# Patient Record
Sex: Male | Born: 1990 | Race: White | Hispanic: No | Marital: Single | Smoking: Never smoker
Health system: Southern US, Community
[De-identification: ages and names within clinical notes are randomized; demographics above are authoritative.]

## PROBLEM LIST (undated history)

## (undated) DIAGNOSIS — B259 Cytomegaloviral disease, unspecified: Secondary | ICD-10-CM

## (undated) DIAGNOSIS — Z934 Other artificial openings of gastrointestinal tract status: Secondary | ICD-10-CM

## (undated) DIAGNOSIS — J9620 Acute and chronic respiratory failure, unspecified whether with hypoxia or hypercapnia: Secondary | ICD-10-CM

## (undated) DIAGNOSIS — G709 Myoneural disorder, unspecified: Secondary | ICD-10-CM

## (undated) DIAGNOSIS — Z8719 Personal history of other diseases of the digestive system: Secondary | ICD-10-CM

## (undated) DIAGNOSIS — R569 Unspecified convulsions: Secondary | ICD-10-CM

## (undated) DIAGNOSIS — T7840XA Allergy, unspecified, initial encounter: Secondary | ICD-10-CM

## (undated) DIAGNOSIS — G808 Other cerebral palsy: Secondary | ICD-10-CM

## (undated) DIAGNOSIS — J189 Pneumonia, unspecified organism: Secondary | ICD-10-CM

## (undated) DIAGNOSIS — K219 Gastro-esophageal reflux disease without esophagitis: Secondary | ICD-10-CM

## (undated) DIAGNOSIS — Z9981 Dependence on supplemental oxygen: Secondary | ICD-10-CM

## (undated) HISTORY — PX: NISSEN FUNDOPLICATION: SHX2091

## (undated) HISTORY — DX: Allergy, unspecified, initial encounter: T78.40XA

## (undated) HISTORY — DX: Pneumonia, unspecified organism: J18.9

## (undated) HISTORY — PX: PEG TUBE PLACEMENT: SUR1034

## (undated) HISTORY — DX: Acute and chronic respiratory failure, unspecified whether with hypoxia or hypercapnia: J96.20

## (undated) HISTORY — PX: CIRCUMCISION: SUR203

## (undated) HISTORY — DX: Other cerebral palsy: G80.8

## (undated) HISTORY — DX: Unspecified convulsions: R56.9

## (undated) HISTORY — PX: OTHER SURGICAL HISTORY: SHX169

---
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
## 2009-01-12 ENCOUNTER — Ambulatory Visit (HOSPITAL_COMMUNITY): Admission: RE | Admit: 2009-01-12 | Discharge: 2009-01-12 | Payer: Self-pay | Admitting: Interventional Radiology

## 2009-05-30 ENCOUNTER — Encounter: Payer: Self-pay | Admitting: Family Medicine

## 2009-05-30 DIAGNOSIS — H47619 Cortical blindness, unspecified side of brain: Secondary | ICD-10-CM

## 2009-05-30 DIAGNOSIS — G809 Cerebral palsy, unspecified: Secondary | ICD-10-CM

## 2009-05-30 DIAGNOSIS — H9190 Unspecified hearing loss, unspecified ear: Secondary | ICD-10-CM | POA: Insufficient documentation

## 2009-05-30 DIAGNOSIS — R569 Unspecified convulsions: Secondary | ICD-10-CM

## 2009-05-30 DIAGNOSIS — F79 Unspecified intellectual disabilities: Secondary | ICD-10-CM | POA: Insufficient documentation

## 2009-06-13 ENCOUNTER — Ambulatory Visit: Payer: Self-pay | Admitting: Family Medicine

## 2009-06-13 DIAGNOSIS — Z993 Dependence on wheelchair: Secondary | ICD-10-CM

## 2009-06-13 DIAGNOSIS — J45909 Unspecified asthma, uncomplicated: Secondary | ICD-10-CM | POA: Insufficient documentation

## 2009-06-13 DIAGNOSIS — G4733 Obstructive sleep apnea (adult) (pediatric): Secondary | ICD-10-CM

## 2009-06-14 ENCOUNTER — Encounter: Payer: Self-pay | Admitting: Family Medicine

## 2009-06-21 ENCOUNTER — Telehealth: Payer: Self-pay | Admitting: Family Medicine

## 2009-06-22 ENCOUNTER — Telehealth: Payer: Self-pay | Admitting: Family Medicine

## 2009-06-29 ENCOUNTER — Encounter: Payer: Self-pay | Admitting: Family Medicine

## 2009-07-20 ENCOUNTER — Ambulatory Visit (HOSPITAL_COMMUNITY)
Admission: RE | Admit: 2009-07-20 | Discharge: 2009-07-20 | Payer: Self-pay | Source: Home / Self Care | Admitting: Family Medicine

## 2009-08-21 ENCOUNTER — Encounter: Payer: Self-pay | Admitting: Family Medicine

## 2009-09-17 ENCOUNTER — Telehealth: Payer: Self-pay | Admitting: Family Medicine

## 2009-09-17 ENCOUNTER — Encounter: Payer: Self-pay | Admitting: Family Medicine

## 2009-10-03 ENCOUNTER — Ambulatory Visit (HOSPITAL_COMMUNITY): Admission: RE | Admit: 2009-10-03 | Discharge: 2009-10-03 | Payer: Self-pay | Admitting: Interventional Radiology

## 2009-10-23 ENCOUNTER — Encounter: Payer: Self-pay | Admitting: Family Medicine

## 2009-11-22 ENCOUNTER — Encounter: Payer: Self-pay | Admitting: Family Medicine

## 2009-11-28 ENCOUNTER — Ambulatory Visit: Payer: Self-pay | Admitting: Family Medicine

## 2009-11-30 ENCOUNTER — Encounter: Payer: Self-pay | Admitting: Family Medicine

## 2010-02-05 ENCOUNTER — Ambulatory Visit: Payer: Self-pay | Admitting: Family Medicine

## 2010-02-05 DIAGNOSIS — R0602 Shortness of breath: Secondary | ICD-10-CM

## 2010-03-08 ENCOUNTER — Encounter: Payer: Self-pay | Admitting: Family Medicine

## 2010-03-17 ENCOUNTER — Encounter: Payer: Self-pay | Admitting: Interventional Radiology

## 2010-03-21 ENCOUNTER — Ambulatory Visit (HOSPITAL_COMMUNITY)
Admission: RE | Admit: 2010-03-21 | Discharge: 2010-03-21 | Payer: Self-pay | Source: Home / Self Care | Attending: Interventional Radiology | Admitting: Interventional Radiology

## 2010-03-28 NOTE — Consult Note (Signed)
Summary: [facility]  [facility]   Imported By: Clydell Hakim 08/30/2009 15:31:16  _____________________________________________________________________  External Attachment:    Type:   Image     Comment:   External Document

## 2010-03-28 NOTE — Progress Notes (Signed)
Summary: Rx  Phone Note Refill Request   Refills Requested: Medication #1:  GLYCOPYRROLATE 1 MG TABS 1 tab per tube three times a day for lung secretions mom is asking for a new rx to be sent to the pharmacy from dr. Keydi Giel, pt is about to start school & mom sts the rx has to be from the doctor he is assigned to. last rx has dr Constance Goltz on it.   Initial call taken by: Knox Royalty,  September 17, 2009 11:15 AM  Follow-up for Phone Call        script sent Follow-up by: Ellery Plunk MD,  September 17, 2009 11:35 AM    Prescriptions: GLYCOPYRROLATE 1 MG TABS (GLYCOPYRROLATE) 1 tab per tube three times a day for lung secretions  #90 x 11   Entered and Authorized by:   Ellery Plunk MD   Signed by:   Ellery Plunk MD on 09/17/2009   Method used:   [id number]       [address]       [place], [place]  [address]       Ph: [phone] or [phone]       Fax: [phone]   RxID:   [id number]

## 2010-03-28 NOTE — Miscellaneous (Signed)
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 1 Tab Per Tube Two Times A Day 2)  Gabapentin 300 Mg Caps (Gabapentin) .Marland Kitchen.. 1 Tab Per Tube Three Times A Day 3)  Depakote Sprinkles 125 Mg Cpsp (Divalproex Sodium) .... 3 Caps Per Tube At 6 Am, 2 Caps Per Tube At 2 Pm, 3 Caps Per Tube At 9 Pm 4)  Baclofen 10 Mg Tabs (Baclofen) .... 2 Tabs Per Tube Three Times A Day 5)  Albuterol Sulfate (2.5 Mg/38ml) 0.083% Nebu (Albuterol Sulfate) .... Inhaled Every 4 Hours As Needed 6)  Pulmicort 0.25 Mg/7ml Susp (Budesonide) .... Inhaled Two Times A Day As Needed   Past History:  Past Surgical History: Jejunal Feeding Tube Spinal Rod Hip Surgery   Family History: Mother alive and healthy. Father alive:  DM. FH negative for cancer, early heart disease.  Social History: Lives in [place] with mom Coty Larsh, b 9[birth year]), dad Michaelpaul Apo, [place] 5[address]), brother Daiel Strohecker, [place] 8119), brother Luigi Stuckey, [place] 1478), sister Jaquari Reckner, [place] 2956).  In school at [employer].  All feeding is per tube (NPO).

## 2010-03-28 NOTE — Miscellaneous (Signed)
Summary: school forms  Clinical Lists Changes fprms to pcp chart box for signature.Golden Circle RN  October 23, 2009 5:04 PM   back to [place] on Friday. Ellery Plunk MD  October 30, 2009 9:27 AM

## 2010-03-28 NOTE — Consult Note (Signed)
Summary: [facility]-no change to meds  [facility]   Imported By: De Nurse 11/29/2009 15:08:53  _____________________________________________________________________  External Attachment:    Type:   Image     Comment:   External Document

## 2010-03-28 NOTE — Miscellaneous (Signed)
Summary: Medication authorization  pts mom dropped off form to be completed, placed on triage desk for any clinical info to be completed. [name]  September 17, 2009 2:57 PM    form for school to be able to administer meds at school to pcp.Golden Circle RN  September 17, 2009 3:36 PM  pcp finished form. I called dad & told him we are mailing it to his home as directed.Golden Circle RN  September 18, 2009 10:19 AM

## 2010-03-28 NOTE — Miscellaneous (Signed)
  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 

## 2010-03-28 NOTE — Assessment & Plan Note (Signed)
Summary: problems with asthma/eo   Vital Signs:  Patient profile:   20 year old male O2 Sat:      97 %  Vitals Entered By: Jimmy Footman, CMA (February 05, 2010 10:49 AM) CC: breathing issues   Primary Provider:  Ellery Plunk MD  CC:  breathing issues.  History of Present Illness: Pt's mom brings him in today after having an episode at school this morning where he was not breathing well.  He was taken to the nurse's office and given albuterol MDI x1 approximately 1hr before coming to clinic.  His O2 sat immediately after albuterol was in the 70's.  Mom states that when she picked him up he looks pale and had a noisy cough.  Mom states that over the past few days she has maybe noticed him "drawing in" with his chest when breathing before putting him on the bus, but did not have any problems at school or in the afternoon or evening when he got home.  No runny nose or cough, no fevers, has been looking well until this morning at school.  Of not, he has had multiple hospitalizations for aspiration, per mom, and has had mucus plugging in the past.  He has not needed him home PRN albuterol in the past few weeks and has been doing very well.  Mom thinks that he looks much better since she has picked him up from school.  She also feels like pulmicort often helps this kind of situation and helps prevent hospitalization and would really like a refill on it since she has not had it for many months.  Current Medications (verified): 1)  Prevacid 30 Mg Cpdr (Lansoprazole) .Marland Kitchen.. 1 Tab Per Tube Two Times A Day 2)  Gabapentin 300 Mg Caps (Gabapentin) .Marland Kitchen.. 1 Tab Per Tube Three Times A Day 3)  Depakote Sprinkles 125 Mg Cpsp (Divalproex Sodium) .... 3 Caps Per Tube At 6 Am, 2 Caps Per Tube At 2 Pm, 3 Caps Per Tube At 9 Pm 4)  Baclofen 10 Mg Tabs (Baclofen) .... 2 Tabs Per Tube Three Times A Day 5)  Albuterol Sulfate (2.5 Mg/26ml) 0.083% Nebu (Albuterol Sulfate) .... Inhaled Every 4 Hours As Needed 6)   Glycopyrrolate 1 Mg Tabs (Glycopyrrolate) .Marland Kitchen.. 1 Tab Per Tube Three Times A Day For Lung Secretions 7)  Diastat .... Unknown Dose 8)  Proventil Hfa 108 (90 Base) Mcg/act Aers (Albuterol Sulfate) .... 2 Puffs Q4hours As Needed For Wheeze  Allergies (verified): 1)  ! Septra  Physical Exam  General:  sleeping comfortably, good color, well hydrated Nose:  no nasal discharge.   Mouth:  pharynx pink and moist and fair dentition.   Lungs:  normal respiratory effort, no intercostal retractions, no accessory muscle use, normal breath sounds, and no crackles.  ?faint, mild, scattered wheezes Heart:  normal rate and regular rhythm.     Impression & Recommendations:  Problem # 1:  DYSPNEA (ICD-786.05)  Episode at school possibly 2/2 mucus plugging which pt was able to relieve with coughing.  O2 sat 97% here. No acute distress. Breathing comfortably. No accessory muscle use. No fevers or focal lung exam findings worrisome for infection.  Will restart pulmicort neb two times a day. Unsure of why peds pulm may have told mom not to use it long term.  Encouraged mom to go see adult pulm at [facility] so that his breathing can continue to be followed.    Orders: [facility]- Est Level  3 ([address])  Complete Medication List:  1)  Prevacid 30 Mg Cpdr (Lansoprazole) .Marland Kitchen.. 1 tab per tube two times a day 2)  Gabapentin 300 Mg Caps (Gabapentin) .Marland Kitchen.. 1 tab per tube three times a day 3)  Depakote Sprinkles 125 Mg Cpsp (Divalproex sodium) .... 3 caps per tube at 6 am, 2 caps per tube at 2 pm, 3 caps per tube at 9 pm 4)  Baclofen 10 Mg Tabs (Baclofen) .... 2 tabs per tube three times a day 5)  Albuterol Sulfate (2.5 Mg/31ml) 0.083% Nebu (Albuterol sulfate) .... Inhaled every 4 hours as needed 6)  Pulmicort 0.25 Mg/55ml Susp (Budesonide) .... Inhaled two times a day as needed 7)  Glycopyrrolate 1 Mg Tabs (Glycopyrrolate) .Marland Kitchen.. 1 tab per tube three times a day for lung secretions 8)  Diastat  .... Unknown dose 9)  Proventil Hfa  108 (90 Base) Mcg/act Aers (Albuterol sulfate) .... 2 puffs q4hours as needed for wheeze  Patient Instructions: 1)  [facility] [address] 2)  Please come back to clinic if he has another episode like this one, or if he begins having fevers, increased difficulty breathing, using his stomach or neck muscles to breath, or just isn't looking good.  At that time we would consider starting antibiotics.  However, he looks good right now, so I do not think that we need to start them just yet. 3)  Have a great holiday! Prescriptions: PULMICORT 0.25 MG/2ML SUSP (BUDESONIDE) Inhaled two times a day as needed  #1 QS x1 mo x 2   Entered and Authorized by:   Demetria Pore MD   Signed by:   Demetria Pore MD on 02/05/2010   Method used:   [facility]        [id number]       [address]       [place], [place]  [address]       Ph: [phone] or [phone]       Fax: [phone]   RxID:   [id number]    Orders Added: 1)  [facility]- Est Level  3 [[id number]]

## 2010-03-28 NOTE — Miscellaneous (Signed)
Summary: Summary of PMH from Dr. Donnie Coffin  Received handwritten summary of [name]'s PMH from Dr. Donnie Coffin, his former pediatrician.  Copied into chart as below:  Dx:  Congenital CMV Infection  1.  CNS     - Seizure disorder - [facility] follows       Last visit 10/25/2008 - Neurontin 300 three times a day       Depakote 125 3-2-3     - Static encephalopathy     - Spastic quadraparesis     - Microcephaly     - Profound mental retardation  2.  Ophtho     - Cortical blindness  3.  Pulmonary     - Sees pediatric pulmonology at [facility]:  Larry Sierras, et. al.     - Recurrent pulmonary aspiration       Glycopyrrolate 1 mg three times a day, suction, sleeps inclined     - OSA, CPAP poorly tolerated     - RAD, occasional albuterol       Baclofen 10 mg 3/4 three times a day     - Occasional pneumonia, last admission 2006  4.  GI - [facility]     - J-tube dependent since early on       Occasionally needs replacement at [facility] radiology     - GERD -- Nissen, Prevacid     - Slow transit constipation - Osmolite, Polyethylene Glycol     - Pancreatitis secondary to balloon 07/2000  5.  Skin - 03/2006, Stage 1 sacral decub     - Uses custom wheelchair, frequent repositioning  6.  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     - Small wedge anterior fracture L2     - Sacral fractures     - Pubic symphesis fracture  7.  Dental - [facility].  Some work done under anesthesia.  8.  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  Family -- nice, intelligent, resourceful, skilled in deailng with it all     - Mother:  Teacher, adult education at [employer]     - Father:  Leander Rams, DM     - Oldest Son:  Regulatory affairs officer, recent  grad     - Celiac disease and asthma in siblings     - Mother knows everything

## 2010-03-28 NOTE — Assessment & Plan Note (Signed)
Summary: NP,tcb   Vital Signs:  Patient profile:   20 year old male Temp:     97.9 degrees F axillary Pulse rate:   94 / minute BP sitting:   104 / 73  Vitals Entered By: Jone Baseman CMA (June 13, 2009 10:01 AM) CC: new patient Is Patient Diabetic? No Pain Assessment Patient in pain? no        Primary Care Provider:  Romero Belling MD  CC:  new patient.  History of Present Illness: 20 yo male with complex medical history presenting with mother to establish care.  All active problems are stable at this point.  Formerly seen by Dr. Maryellen Pile, pediatrics, but has graduated from pediatrics.  REACTIVE AIRWAY DISEASE (ICD-493.90):  History of frequent PNA requiring ICU admissions--no admissions for the past 2 years.  PMH also significant for pneumothorax.  Currently stable.  Uses the following:  - ALBUTEROL  - PULMICORT  - GLYCOPYRROLATE  SLEEP APNEA, OBSTRUCTIVE (ICD-327.23):  Has been prescribed CPAP but unable to use.  CONGENITAL CYTOMEGALOVIRUS INFECTION (ICD-771.1):  With multiple sequelae as below.  Lives at home, cared for by family, [facility]  All nutrition via J-tube.  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 1 Tab Per Tube Two Times A Day 2)  Gabapentin 300 Mg Caps  (Gabapentin) .Marland Kitchen.. 1 Tab Per Tube Three Times A Day 3)  Depakote Sprinkles 125 Mg Cpsp (Divalproex Sodium) .... 3 Caps Per Tube At 6 Am, 2 Caps Per Tube At 2 Pm, 3 Caps Per Tube At 9 Pm 4)  Baclofen 10 Mg Tabs (Baclofen) .... 2 Tabs Per Tube Three Times A Day 5)  Albuterol Sulfate (2.5 Mg/24ml) 0.083% Nebu (Albuterol Sulfate) .... Inhaled Every 4 Hours As Needed 6)  Pulmicort 0.25 Mg/90ml Susp (Budesonide) .... Inhaled Two Times A Day As Needed 7)  Glycopyrrolate 1 Mg Tabs (Glycopyrrolate) .Marland Kitchen.. 1 Tab Per Tube Three Times A Day For Lung Secretions 8)  Diastat .... Unknown Dose  Allergies (verified): 1)  ! Septra  Past History:  Past Medical History: Frequent PNA requiring ICU stays Pneumothorax Pulmonologist, Gastroenterologist: [facility], considering switch to [facility] for both now that is adult Neurologist -- [facility] -- Dr. Jearld Fenton, [facility]:  takes care of wheelchair contractures, spinal rod, annual visit  Past Surgical History: Chronic Jejunal Feeding Tube:  changed q1-25months as needed. Spinal Rod Hip Surgery  Family History: Mother alive and healthy. Father alive:  DM. FH negative for cancer, early heart disease.  Social History: Lives in [place] with mom Macaulay Reicher, b 60[birth year]), dad Odai Wimmer, [place] [address]), brother Tetsuo Coppola, [place] 1191), brother Leelynd Maldonado, [place] 478[address]), sister Laquincy Eastridge, [place] 9562).  In school at [employer].  All feeding is per J-tube (NPO).Smoking Status:  never  Review of Systems       Unable to obtain.  Physical Exam  Additional Exam:  VITALS:  Reviewed, normal GEN: Clean, well-dressed, well-groomed, in wheelchair, turns to loud voice NECK: Midline trachea, no masses/thyromegaly, no cervical lymphadenopathy CARDIO: Regular rate and rhythm, no murmurs/rubs/gallops, 2+ bilateral radial pulses RESP: Clear to auscultation, normal work of breathing, no retractions/accessory muscle use ABD: Normoactive bowel sounds,  nontender, no masses/hepatosplenomegaly, J-tube site c/d/i EXT: Nontender, no edema SKIN: Sacral skin intact MSK:  Contractures of arms and legs   Impression & Recommendations:  Problem # 1:  REACTIVE AIRWAY DISEASE (ICD-493.90) Assessment Unchanged Evaluated as stable.  Discussed establishing care with [facility] now that he is an adult. His updated medication list for this problem includes:    Albuterol Sulfate (2.5 Mg/102ml) 0.083% Nebu (Albuterol sulfate) ..... Inhaled every 4 hours as needed    Pulmicort 0.25 Mg/62ml Susp (Budesonide) ..... Inhaled two times a day as needed  Problem # 2:  SLEEP APNEA, OBSTRUCTIVE (ICD-327.23) Assessment: Unchanged Evaluated as stable.  Unable to use CPAP.  Discussed establishing care with [facility] now that he is an adult.  Problem # 3:  SEIZURE DISORDER (ICD-780.39) Evaluated as stable.  Frequent seizures daily.  Follows with Dr. Sharene Skeans. His updated medication list for this problem includes:    Gabapentin 300 Mg Caps (Gabapentin) .Marland Kitchen... 1 tab per tube three times a day    Depakote Sprinkles 125 Mg Cpsp (Divalproex sodium) .Marland KitchenMarland KitchenMarland KitchenMarland Kitchen 3 caps per tube at 6 am, 2 caps per tube at 2 pm, 3 caps per tube at 9 pm  Problem # 4:  Preventive Health Care (ICD-V70.0) Assessment: Comment Only Am verifying immunization status.  He would need all usual childhood immunizations plus Pneumovax.  Complete Medication List: 1)  Prevacid 30 Mg Cpdr (Lansoprazole) .Marland Kitchen.. 1 tab per tube two times a day 2)  Gabapentin 300 Mg Caps (Gabapentin) .Marland Kitchen.. 1 tab per tube three times a day 3)  Depakote Sprinkles 125 Mg Cpsp (Divalproex sodium) .... 3 caps per tube at 6 am, 2 caps per tube at 2 pm, 3 caps per tube at 9 pm 4)  Baclofen 10 Mg Tabs (Baclofen) .... 2 tabs per tube three times a day 5)  Albuterol Sulfate (2.5 Mg/28ml) 0.083% Nebu (Albuterol sulfate) .... Inhaled every 4 hours as needed 6)  Pulmicort 0.25 Mg/64ml Susp (Budesonide)  .... Inhaled two times a day as needed 7)  Glycopyrrolate 1 Mg Tabs (Glycopyrrolate) .Marland Kitchen.. 1 tab per tube three times a day for lung secretions 8)  Diastat  .... Unknown dose Prescriptions: GLYCOPYRROLATE 1 MG TABS (GLYCOPYRROLATE) 1 tab per tube three times a day for lung secretions  #90 x 3   Entered and Authorized by:   Romero Belling MD   Signed by:   Romero Belling MD on 06/13/2009   Method used:   [facility]       [address]       [place], [place]  [address]       Ph: [phone] or [phone]       Fax: [phone]   RxID:   [id number]

## 2010-03-28 NOTE — Letter (Signed)
Summary: To Dr. Alvina Filbert [facility]  [address]   [place], [place] [address]   Phone: [phone]  Fax: [phone]    06/29/2009  Teena Irani. Donnie Coffin, MD Pediatrics [address] [place], [place]  [address] Tel [phone] Fax [phone]  Dear Dr. Donnie Coffin,  I had the pleasure of meeting your former patient, [name], with his mother to establish care with our practice.  I wanted to write to thank you for sending his records and especially for the handwritten summary you attached to the top of the chart--this is very helpful.  As you know, his mother was able to provide a very detailed and thorough history as well.  Regards,   Madlyn Frankel. Constance Goltz, MD

## 2010-03-28 NOTE — Miscellaneous (Signed)
Summary: Guardianship  Letter  Guardianship  Letter   Imported By: Bradly Bienenstock 06/14/2009 16:48:12  _____________________________________________________________________  External Attachment:    Type:   Image     Comment:   External Document

## 2010-03-28 NOTE — Assessment & Plan Note (Signed)
Summary: flu shot,tcb  Nurse Visit   Vital Signs:  Patient profile:   20 year old male Temp:     97.5 degrees F oral  Vitals Entered By: Theresia Lo RN (November 28, 2009 4:38 PM)  Allergies: 1)  ! Septra  Immunizations Administered:  Influenza Vaccine # 1:    Vaccine Type: Fluvax 3+    Site: right deltoid    Mfr: GlaxoSmithKline    Dose: 0.5 ml    Route: IM    Given by: Theresia Lo RN    Exp. Date: 08/21/2010    Lot #: [id number]    VIS given: 09/18/09 version given November 28, 2009.  Flu Vaccine Consent Questions:    Do you have a history of severe allergic reactions to this vaccine? no    Any prior history of allergic reactions to egg and/or gelatin? no    Do you have a sensitivity to the preservative Thimersol? no    Do you have a past history of Guillan-Barre Syndrome? no    Do you currently have an acute febrile illness? no    Have you ever had a severe reaction to latex? no    Vaccine information given and explained to patient? yes  Orders Added: 1)  Flu Vaccine 92yrs + [[address]] 2)  Admin 1st Vaccine [[address]]

## 2010-03-28 NOTE — Progress Notes (Signed)
Summary: phn msg  Phone Note Call from Patient Call back at [phone]   Caller: mom-[name] Summary of Call: Mom will bring in copy of [name]'s shot records that should show he is up to date. Initial call taken by: Clydell Hakim,  June 22, 2009 9:14 AM     Appended Document: phn msg Called mom back.  She states he is up to date and offers to bring records.  She also states Dr. Donnie Coffin is sending records, so I advised her not to worry about bringining in records as we'll just wait and review what he sends.

## 2010-03-28 NOTE — Progress Notes (Signed)
Summary: Immunization status  Phone Note Outgoing Call Call back at [employer] [phone]   Call placed by: Romero Belling MD,  June 21, 2009 9:46 AM Call placed to: Patient Summary of Call: No answer.  Left message asking parents to call back to discuss immunization status.  Clinic staff has confirmed that he is up to date on all immunizations except for Varicella, Tdap, HAV, Pneumovax. Initial call taken by: Romero Belling MD,  June 21, 2009 9:47 AM

## 2010-03-28 NOTE — Miscellaneous (Signed)
Summary: request for hand splints  Clinical Lists Changes mom dropped off a medicaid DME form asking for hand spints for both hands. to pcp. will he need another appt for this? forms in pcp chart box.Golden Circle RN  March 08, 2010 10:06 AM  to Lorenso Quarry MD  March 13, 2010 10:33 AM   Appended Document: request for hand splints All is completed.  Mom notifed that all paperwork will be left up front for her to pick up.

## 2010-06-04 ENCOUNTER — Telehealth: Payer: Self-pay | Admitting: Family Medicine

## 2010-06-04 MED ORDER — POLYETHYLENE GLYCOL 3350 17 G PO PACK: PACK | ORAL | Status: DC

## 2010-06-04 NOTE — Telephone Encounter (Signed)
Needs miralax called in for his constipation.  [facility] [address]

## 2010-06-04 NOTE — Telephone Encounter (Signed)
Spoke with pt's mother over the phone. Pt is using miralax prn for intermittent constipation. Usually using 1-2 times/ week. Is about to run out of last bottle. Told pt that I would fil medication and contact clinic if there were any other questions. Mom agreeable.

## 2010-06-12 ENCOUNTER — Encounter: Payer: Self-pay | Admitting: Family Medicine

## 2010-06-12 DIAGNOSIS — R569 Unspecified convulsions: Secondary | ICD-10-CM

## 2010-07-09 NOTE — Assessment & Plan Note (Signed)
[facility]   NAME:  [name], [name]               ACCOUNT NO.:  [id number]   MEDICAL RECORD NO.:  [id number]      DATE OF BIRTH:  11-04-1990   PHYSICIAN:  Jonelle Sports. Sevier, M.D.  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   The patient has had meticulous care through the years by his mother and  with the use of proper offloading equipment at home and so forth has  avoided major issues.  She says at one time in the past he did have a  pressure ulcer that took about a year to close.  He was not seen at this  facility at that time.   He was seen here for a single visit in February 2008, at which time he  had a stage I pressure ulcer and at that time a change in his pressure  pad kept it from advancing further.   She reports now that things were satisfactory until he started going  back to school this fall and the transport situation apparently had  somehow led to problems with the development of a small pressure ulcer  on the right aspect of the sacral area really on the high buttock near  the sacral crease.  She at that time investigated his pressure mattress  and found that indeed it was not functioning satisfactory and that has  been replaced.  He also had an alternating pressure pad for his  wheelchair in one quadrant, the one in which this is involved was found  not to be working.  That has been reordered.   She has been treating the area at home simply with an application of  barrier cream.  She has seen minor bleeding at times of no significant  drainage, no odor.  There apparently has been no surrounding erythema.   Examination is quite limited.  The patient is somnolent and does not  respond to the  examiner.  His blood pressure is 120/71, pulse 90,  respirations 18, temperature 95.7.  He is in no apparent distress.  Nutritional status appears to be satisfactory and he is remarkably clean  and well cared for.  On the right buttocks are 2 tiny areas of  breakdown, one measuring 0.4 x 0.3 cm, the other 0.2 x 0.2, and these  have clean bases.  There is no surrounding erythema.  There is no  drainage, no odor, no induration, although he is insensate nothing that  we do interventionally seems to generate any pain enough to arouse him.   IMPRESSION:  Stage II pressure ulcers high right buttock just adjacent  to the sacral crease.   DISPOSITION:  The wounds do not require any debridement today.  I have  further cleansed the surrounding area and he will be treated today with  an application of DuoDerm.   He does have a home health nurse and she will be asked to change this in  1 week and we will see the patient again in 2 weeks.  The mother has  been instructed that should it become loose or become fecally soiled or  she has any other problems that it would need to be changed more quickly  and that she could rely on the home health nurse to do that.   As stated above, his followup visit here will be in 2 weeks.           ______________________________  Jonelle Sports Cheryll Cockayne, M.D.     RES/MEDQ  D:  10/25/2008  T:  10/25/2008  Job:  [id number]

## 2010-07-09 NOTE — Assessment & Plan Note (Signed)
[facility]   NAME:  [name], [name]               ACCOUNT NO.:  [id number]   MEDICAL RECORD NO.:  [id number]      DATE OF BIRTH:  09-May-1990   PHYSICIAN:  Jonelle Sports. Sevier, M.D.       VISIT DATE:                                   OFFICE VISIT   ADDENDUM   He has had several prior surgeries including something to his right hip  and apparently that has been a placement of a rod there in.  He has also  had a Harrington rod placed in his spine back in 1998.   He is said to be allergic to CIPRO which causes a rash.   His current medications include:  1. Gabapentin 300 mg t.i.d.  2. Depakote 125 mg t.i.d.  3. Baclofen 10 mg three force of a tablet t.i.d.  4. Glycopyrrolate 1 mg t.i.d.  5. Prevacid 30 mg b.i.d.  6. Albuterol inhaler as needed.  7. Polyethylene glycol on a daily basis.           ______________________________  Jonelle Sports Cheryll Cockayne, M.D.     RES/MEDQ  D:  10/25/2008  T:  10/25/2008  Job:  [id number]

## 2010-07-12 NOTE — Discharge Summary (Signed)
NAME:  [name], [name]                         ACCOUNT NO.:  [id number]   MEDICAL RECORD NO.:  [id number]                   PATIENT TYPE:  INP   LOCATION:  6150                                 FACILITY:  [facility]   PHYSICIAN:  Pablo Ledger, M.D.               DATE OF BIRTH:  1990/02/27   DATE OF ADMISSION:  06/27/2002  DATE OF DISCHARGE:  07/03/2002                                 DISCHARGE SUMMARY   PRIMARY PHYSICIAN:  Dr. Donnie Coffin   FINAL DIAGNOSES:  1. Left lower lobe pneumonia.  2. Gastroenteritis.  3. Congenital CMV.  4. Severe developmental delay, cortical blindness, and hearing impairment.  5. Status post jejunostomy tube, 2002.  6. History of Nissen and gastrostomy tube.  7. History of spinal surgery.  8. Seizure disorder.  9. History of pancreatitis.   PROCEDURES:  Chest x-ray on admission:  Remarkable for a left lower lobe  pneumonia.  Chest x-ray on date of discharge, Jul 03, 2002;  No obvious  infiltrate despite unofficial reading by radiology of continued left lower  lobe infiltrate.  KUB from Jun 29, 2002:  Showed early ileus.   LABORATORY DATA:  Rotavirus:  Negative.  Clostridium difficile:  Negative.  Blood culture:  No growth in final.  Electrolytes:  Sodium 143, potassium  3.2, chloride 106, bicarb 30, BUN 1, creatinine 0.3, glucose of 80.   HOSPITAL COURSE:  RESPIRATORY:  The patient presented with cough and  tachypnea for 2 days prior to admission.  A chest x-ray on admission was  suggestive of left lower lobe pneumonia.  The patient furthermore had an  increased oxygen requirement to maintain saturations greater than 90%.  The  patient was started on 2 days of cefuroxime, however, a sputum culture  showing moderate growth of Pseudomonas prompted a change in antibiotic to  ciprofloxacin.  The patient's respiratory status improved on this regimen  along with q.4h. chest PT and albuterol nebulizers.  Prior to discharge, the  patient was back on his baseline  oxygen status of 2 liters during the night  and room air during the day.  A repeat chest x-ray done on day of discharge  showed no evidence of acute change or pneumonia.  The patient to be  discharged on completion of 10-day ciprofloxacin course.   GASTROINTESTINAL:  The patient developed gastroenteritis in the midst of his  hospital stay with acute episodes of vomiting and diarrhea.  He was tested  for both rotavirus and C. difficile which were negative.  The patient was  temporarily placed on IV fluids and feeds were held until the acute illness  resolved.  Prior to discharge the patient was back to tolerating his full  home feeding regimen and IV fluids were discontinued.  The patient had  adequate urine output and formed stools prior to discharge.  The patient to  go home on his home feeding regimen of Nutren with fiber  per J-tube at 42 mL  an hour with a recommendation by our nutritionist to further evaluate  calorie intake and possibly change to Jevity 1.2 based on discussion with  the patient's routine, gastroenterologist, and a further nutrition consult  there.   NEUROLOGIC:  The patient has a history of seizure disorder, however, was  stable and free of any seizure activity throughout hospital stay.  The  patient was continued on home medications of Neurontin and Depakote which he  will continue on an outpatient basis and follow up with neurology as  previously scheduled.   DISCHARGE INSTRUCTIONS:  MEDICATIONS:  1. Home oxygen at 2 liters per minute each night.  2. Neurontin 300 mg per J-tube t.i.d.  3. Depakote 125 mg sprinkles, 3 each morning, 2 each afternoon, and 3 before     bed per J-tube.  4. Reglan 2 mg per J-tube q.i.d.  5. Prevacid 30 mg per J-tube b.i.d.  6. Baclofen 7.5 mg per J-tube t.i.d.  7. Pulmicort 0.25 b.i.d.  8. Albuterol 5 mg nebulizers q.4h. x24 hours after discharge, then may be     spaced to q.4-6h. p.r.n.  9. Ciprofloxacin 375 mg per J-tube b.i.d.  through Jul 08, 2002, for     completion of a 10-day course.   DIET:  Nutren with fiber per J-tube at 42 mL an hour per routine home  regimen.   ACTIVITY:  Ad lib.   SPECIAL INSTRUCTIONS:  The patient directed to call doctor for a fever,  increased work of breathing or oxygen need, vomiting, or other concerns.  The patient directed to follow up with primary doctor, Dr. Donnie Coffin, in the  next 1-2 days after discharge.  Family will call.     Pediatrics Resident                       Pablo Ledger, M.D.    PR/MEDQ  D:  07/03/2002  T:  07/05/2002  Job:  [id number]

## 2010-07-12 NOTE — Discharge Summary (Signed)
NAMEJAEGER, [name]               ACCOUNT NO.:  [id number]   MEDICAL RECORD NO.:  [id number]          PATIENT TYPE:  INP   LOCATION:  6150                         FACILITY:  [facility]   PHYSICIAN:  Adrian Blackwater, MDDATE OF BIRTH:  10-Jan-1991   DATE OF ADMISSION:  05/15/2004  DATE OF DISCHARGE:  05/21/2004                                 DISCHARGE SUMMARY   PRIMARY PHYSICIAN:  Dr. Zella Ball.   HOSPITAL COURSE:  This is a 20 year old with a history of MRCP secondary to  congenital CMV with cortical blindness and hearing impairment, also seizure  disorders, admitted with bilateral pneumonia and right tibia fracture.   1.  Pneumonia.  The patient was admitted to PCU on RDS requiring BIPAP and      treatment with vancomycin, ceftriaxone and Azithromycin.  The patient      was transferred to floor on May 18, 2004 with O2 by nasal cannula.      Azithromycin completed by day course.  Vancomycin and ceftriaxone were      discontinued and Omnicef prescribed on 15 day treatments, of total      antibiotics.   1.  Right tibia fracture.  Follow-up by orthopedics.  Plains put on 90      degree flexion.  Will follow up in two weeks.  Possible cause marked      osteopenia.   1.  FEN.  Initially, IV fluid resuscitations and start home treatments,      changed to Jevity 1.2 calories, total amount of 840 mL in 24 hours, at      35 mL per hour.  Continue to supply 1008 kilocalories with 46 grams of      protein and 680 mL of water.  Also to continue free water at 60 mL x6      per day.   1.  Thrombocytopenia.  Possible dilution or secondary to Depakote.      Normalized upon discharge.   1.  Seizures.  Nausea and __________.  During hospitalization.  Well      controlled with Depakote.   1.  Right hand blistering secondary to IV infiltration.  Will invite wound      care with Vaseline gauze treatment.   LABORATORY AND X-RAY DATA:  Laboratories at admission:  White blood cells  9.6, hemoglobin  12.3, hematocrit 34.6, platelets 75,000.  Sodium 133,  potassium 4.2, chloride 98, bicarb 29, BUN 14, creatinine 0.3, glucose 138,  calcium 8.5.  Blood cultures negative on May 15, 2004.   LABORATORY AT DISCHARGE:  White blood cells 6.2, hemoglobin 10, hematocrit  27.8, platelets 192,000.  MCV 96.4, MCHC 35.8, RDW 13.3.  sodium 146,  potassium 3.6, chloride 106, bicarb 35, BUN 4, creatinine 0.3, glucose 106,  AST 20, ALT 8, alkaline phosphatase 115, total bilirubin 0.7, total protein  4.5, albumin 1.9, calcium 8.2.  C-diff negative.  __________ culture from  May 16, 2004 grew Pseudomonas aeruginosa. On May 18, 2004, a chest x-ray  shows improvement in bilateral pneumonia.  Right knee x-ray shows a  transverse fracture of the proximal tibial metaphysis.  Valproic  level on  May 21, 2004:  95.9 with normal 50-100 mg/mL.   DISCHARGE DIAGNOSES:  1.  Bilateral pneumonia.  2.  Right proximal tibial plateau fracture.  3.  Seizure disorder.  4.  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  5.  History of spinal surgery and Nissen fundoplication and J-tube      placement.   MEDICATIONS AT DISCHARGE:  1.  Omnicef 300 mg to give 12 mL of 125 mg at 5 mL solution p.o. b.i.d.  2.  Gabapentin 300 mg to give 6 mL of 250 mg at 5 mL per J tube t.i.d.  3.  __________ sodium 350 mg of 250 mg over 5 ml through J-tube at 2 p.m.      and 7.5 mL per J-tube q.a.m. __________.  4.  Protonix 40 mg __________ mL daily per J-tube.  5.  Albuterol 5 mg via nebulizer q.4h. p.r.n.  6.  Tylenol 650 mg of a 20.3 mL __________.  Per hour p.r.n. pain or fever.   DISCHARGE WEIGHT:  Not recorded.   DISCHARGE CONDITION:  Improved.   DISCHARGE INSTRUCTIONS AND FOLLOWUP:  Follow up with primary care physician  next week and with Dr. Lajoyce Corners in two weeks.      IM/MEDQ  D:  05/23/2004  T:  05/23/2004  Job:  [id number]   cc:   Donnie Coffin, M.D.   [facility]  [address]  [place]  [place]  [address]  Fax: [phone]

## 2010-07-12 NOTE — Discharge Summary (Signed)
[facility]  Patient:    [name], [name]                      MRN: [id number] Adm. Date:  [id number] Disc. Date: 01/10/00 Attending:  Gerrianne Scale Dictator:   Pediatric Resident CC:         Teena Irani. Donnie Coffin, M.D. (fax to [phone])   Discharge Summary  HISTORY OF PRESENT ILLNESS:  [name] is a 20 year old white male with a history of congenital CMV with neurologic devastation including blindness and partial deafness.  He also has a history of seizures and a history of reflux, status post two Nissen procedures.  He was admitted for an episode of blueness reported by his mother.  His mother noted vomiting on the morning of January 07, 2000.  The patient went off to school and, at [employer], it was noted by his teachers that he was having an episode of shortness of breath associated with a blueness of his hands, feet, and lips.  At the school, they measured pulse oximetry and noted values in the 70s.  He was given O2, and his saturations increased to the 80s and then to the 90s.  When O2 was discontinued, he returned back into the 80s, so oxygen was continued for 15 minutes at that time.  A coughing episode during this period brought about some improvement.  His mother arrived, noted that he was having increased work of breathing and decided to bring him to the ER.  In the ER, Marissa was breathing much more comfortably, according to his mother.  Physical exam revealed no increased work of breathing on 2 L of O2, and his lungs sounded clear to auscultation bilaterally.  He had no cyanosis at that time.  A chest x-ray showed no change when compared to previous films, but there was an area of left basilar hyperdensity most consistent with scarring in the region.  The patient was admitted on January 07, 2000, because the history suggested possible aspiration pneumonitis, and we hoped to follow the patient for development of respiratory  symptoms overnight.  HOSPITAL COURSE:  He was placed on a CR monitor with continuous pulse oximetry to keep his oxygen saturations greater than 92%.  The patient was made nothing per G-tube overnight, and the head of his bed was elevated to decrease reflux. He was given maintenance fluids, D-5 quarter normal saline plus 20 mEq KCl/L at 70 cc/hr.  He was continued on maintenance medications for seizures which included Keppra and Neurontin.  On the night of November 13 to November 14, the patient had a febrile episode with a t-max of 103.1, and he responded to a single dose of Tylenol.  No further episodes of fever occurred for the duration of his hospitalization.  An RSV test obtained that night proved negative.  A 10-minute seizure episode with symptoms described as typical by mom also occurred on the night of January 07, 2000.  No medications were given, and the patient had no further episodes overnight and had no further lengthy episodes of seizure throughout the duration of his hospitalization. The patient, on the night of January 07, 2000, also had a period of hypoxia which was associated with an increased amount of upper airway noise.  A VBG was ordered to see if these episodes were normal for him, and he had some sort of chronic compromise, and values returned with evidence of such compromise. Recommendations were then given to  his mother regarding positional influences on his symptoms and positions of the patient which may reduce these symptoms. On January 08, 2000, G-tube feeds were restarted, and the patient continued his normal Reglan and Zantac regimens.  Since the patient had symptoms of constipation, a KUB was ordered, and it showed no evidence of severe constipation or impaction.  Nonetheless, lactulose was given to help stimulate bowel movements in the patient.  On January 09, 2000, the patient had a nutrition consult and was changed from 40 cc/hr PediaSure to Jevity  at 30 cc/hr in order to decrease caloric intake and help control the recent weakening the patient has been experiencing.  On January 09, 2000, the patients dose of Keppra was also decreased from a level of 125 mg in the a.m. and 250 mg in the p.m. to 125 mg b.i.d. in order to decrease episodes of increased sleepiness that the patient has been experiencing since being on the medication.  Dr. Sharene Skeans was involved in establishing the new regimen, and the mother will follow up with him for any further alterations of this regimen.  On the night of January 09, 2000, the patient was made n.p.o. after midnight for a procedure the next day involving replacement of his G-tube with a GJ-tube in radiology.  Overnight on January 09, 2000, he had no significant events.  He went down for the procedure, and the GJ-tube was placed on January 10, 2000.  After placement, the patient fared very well.  He was placed back on his regimen of Jevity 30 cc/hr.  Nutrition was consulted to discuss his feeding and his fluid intake, and a plan was established for him to be on at home at that time.  ______ Home Health Care was contacted in regards to the new plan for feeding, and it was determined to switch the patient to a regimen of Nutren 1.0 with fiber at 30 cc/hr.  The patient was in his usual state at discharge and was having O2 saturations in the 90s, with no significant respiratory compromise, no signs of fever, and no other signs of distress whatsoever.  DISCHARGE MEDICATIONS:  The patient was discharged with the same medications in came in on prior to his arrival, including: 1. Neurontin 300 mg per GJ-tube b.i.d. 2. Keppra, which was reduced to 125 mg per GJ-tube b.i.d. 3. Reglan 2 mg per GJ-tube four times a day. 4. Zantac 45 mg per GJ-tube four times a day. 5. He was also given a prescription for lactulose 0.67 g/ml and given 7.5 ml    by GJ-tube each day for constipation.  DISCHARGE INSTRUCTIONS:   The mother was advised to provide a diet of 30 cc/hr of Nutren per GJ-tube and 12 cc/hr of water, for a total feed volume of  42 cc/hr for the patient.  The mother was instructed on signs and symptoms to look out for in the event that Emerson enters into respiratory compromise and was given instructions on different positioning techniques to use in the event of compromise.  Also, a note was prepared for [employer], [employer], which included information on what to do in the event of an episode of hypoxia and included information on the patients new diet regimen.  The school was also advised that they may provide oxygen to [place] in the event that he becomes hypoxic.  FOLLOW-UP:  The patients mother will follow up with Dr. Donnie Coffin, the patients primary care [facility], regarding home oxygen therapy.  The patients mother will call Dr.  Hickling, the patients neurologist, early next week to discuss decreasing his Keppra dose further.  The patient will follow up with Dr. Donnie Coffin on an outpatient basis as needed.DD:  01/10/00 TD:  01/11/00 Job: [id number]

## 2010-07-12 NOTE — Discharge Summary (Signed)
NAME:  [name], [name]                         ACCOUNT NO.:  [id number]   MEDICAL RECORD NO.:  [id number]                   PATIENT TYPE:  INP   LOCATION:  6150                                 FACILITY:  [facility]   PHYSICIAN:  Deanna Artis. Sharene Skeans, M.D.           DATE OF BIRTH:  1990/07/01   DATE OF ADMISSION:  11/18/2001  DATE OF DISCHARGE:  11/22/2001                                 DISCHARGE SUMMARY   FINAL DIAGNOSES:  1. Status epilepticus, 345.3.  2. 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  3. Congenital cytomegalovirus with acquired microcephaly, cortical     blindness, spastic quadriparesis and severe mental retardation.   PROCEDURES:  An EEG.   COMPLICATIONS:  None.   HISTORY OF PRESENT ILLNESS:  The patient is a 20 year old young man who I  have followed since infancy. He had congenital CMV with medical problems as  noted above.   The patient had onset of seizures while on his school bus ride home, lasting  approximately 30 minutes. Apparently the driver recognized this, but did not  call for help, nor did the driver attempt  to contact the mother. The child  was brought home and had seizures for an additional 45 minutes to an hour  before EMS arrived. The seizures continued throughout the EMS transport.  These were described as eyes rolling back in the head, lip chewing,  unilateral head jerking. The seizures involved the upper extremities with  tonic and clonic activity. The seizures were actually discrete episodes, but  the patient remained  in a seizure like or postictal state for over two  hours. The child was treated with Ativan 2 mg x 1, was loaded with Cerebex  and then when that failed to quell the seizures, was loaded with Depakote.   The patient's daily maintenance medicine is Neurontin. He has been on  multiple antiepileptic medications, most of which he has either not  tolerated or have been inefficacious.   PAST  MEDICAL HISTORY:  The patient was  a full term infant with congenital  CMV with microcephaly and early onset seizures which have been difficult to  control throughout  his life. He has severe mental retardation. He has  developed very severe spasticity and this seems to be a somewhat asymmetric,  with the left side with greater flexion than the right and decreased range  of motion. The patient has been followed by Dr. Orson Aloe (orthopedic  surgeon at [facility]). He has been referred to the comp rehab clinic at  [facility], and  will attend that clinic  in approximately one month.   HOSPITAL COURSE:  After loading with Dilantin and Depakote, the patient had  no further seizures. I cannot remember the last time he has had a prolonged  episode of status. He has had daily, infrequent minor motor seizures that  did not  effect his function. Since being admitted to the hospital, the  patient has had no seizures whatsoever, but has been quite somnolent,  postictal, hypothermic and very lethargic.   With onset of hypothermia (temperatures down to 93), concerns were raised  about the possibility of sepsis. The patient had blood cultures drawn on the  26th and he was placed on ceftriaxone. Cultures have been negative for three  days. The patient's temperature has come down. He also had significant  tachycardia  with heart rates up into the 160s over the weekend of unknown  cause; that has also come down to normal.   Over the last two days other than the tachycardia, the patient has been near  baseline. Because of his improvement, the lack of seizures or stable vital  signs, he has been stable enough to go home.   PHYSICAL EXAMINATION:  VITAL SIGNS:  On examination today his temperature  was 96.6 axillary, pulse 105, respirations 23, O2 saturation 99% on 2 liters  of oxygen. The patient had heart rates rise up to 130 while I examined him.  HEENT:  No signs of  infection. Wax occludes the right external auditory  canal. The pharynx is unremarkable. Conjunctivae are normal. He is  microcephalic.  NECK: Supple.  LUNGS:  Clear to auscultation. There is no increased work of breathing. No  rales or rhonchi.  ABDOMEN:  Soft, bowel sounds normal. The PEG tube is normal.  No hepatosplenomegaly.  EXTREMITIES:  They were significantly contracted, the left much moreso than  the right. The left knee is flexed fairly tightly into the body and cannot  be extended. The right shows some increased tone but without flexion  contracture. There is no edema or cyanosis.  SKIN:  No rash.  NEUROLOGIC:  Mental status, the patient is awake but poorly responsive to  external stimuli. He has markedly dilated pupils (5 to 6 mm) that are  briskly reactive. Fundi showed somewhat pale disks, sharp disk margins,  normal venous pulsations. No lesions were seen in the funduscopic  examination. He does not blink to bright light. He has roving eye movements  and does not fix and follow. He  has equal facial grimace. He tolerated  handling fairly well, other than  increased heart rate and some moaning as I  examined him. He had significant dysphagia and is not fed orally.  The  cranial nerves, round, reactive dilated pupils. Extraocular movements are  roving and appear at this time conjugate, although sometimes they are not.  Symmetric facial strength with mild lower facial weakness, no drooling at  this time.   Motor examination had spastic quadriparesis, left worse than right with  increased left-sided tone. Both arm and leg are in a flexion contracture and  difficult to extend. The right side shows increased tone but the limbs are  able to be fully extended. There is a mixture of spasticity and rigidity in  his tone. His hands are fisted. He does not have fine motor movements. Sensation slight withdrawal x 4. Deep tendon reflexes were quite brisk  except for in the biceps  and patella, absent at the ankles (heel cords are  not particularly tight).   LABORATORY DATA:  His initial laboratory off of an iSTAT: Sodium 141,  potassium 3.8, chloride 107, CO2 29, BUN 8, creatinine 0.5, glucose 86.  Hemoglobin 14, hematocrit 40.  pH 7.37, pCO2 48.2, bicarbonate 28, delta  base +2.  Blood cultures are negative as of day 3. His  most recent CBC,  white count 6100, hemoglobin 13.3, hematocrit 38.5, MCV 89.5, platelet count  217,000, 67 polys, 28 lymphocytes, 3 monocytes, 2 eosinophils, 1 basophil.  Sodium 139, potassium 3.7, chloride 107, CO2 26, glucose 88, BUN 7,  creatinine 0.3, calcium 8.9. Albumin 3.5, total protein 6.3, AST 25, ALT 21,  alkaline phosphatase 222, total bilirubin 0.6,  direct bilirubin 0.1.  Magnesium 2.2, phosphorus 3.7.   The patient was  loaded with phenytoin. Peak level was 13.4, trough 9.l.  valproic acid levels ranged from 70.5 to 106.5. Gabapentin level is pending  at this time.   A urinalysis, specific gravity 1.013, pH 6.5, ketones 15, trace leukocyte  esterase, 0 to 2 red blood cells and white blood cells. Catheterization  urine culture showed no growth.   An EEG carried out during the hospitalization showed evidence of diffuse  background slowing and low voltage. There were left-sided interictal  spikes  and brief right frontal rhythmic  theta and left temporal rhythmic alpha  range activity, which may be consistent with brief electrographic seizures  without clinical accompaniments. I cannot be certain.  Plans were made to  perform an MRI of the  brain, but I really do not think that is going to  show any significant abnormalities.  A chest x-ray of the patient was  unremarkable.   DISPOSITION:  The patient is discharged in improved condition. He has had  some monitoring and always had a sinus rhythm.   DISCHARGE MEDICATIONS:  1. Depakote sprinkles 125 mg 2 p.o. t.i.d. at 6 a.m., 2 p.m., 8 p.m.  2. Neurontin 300 mg capsules 1  p.o. t.i.d. at 6 a.m., 2 p.m. and 8 p.m.  3. Dilantin which has been given as an Infatab at a dose of 50 mg t.i.d. It     will be discontinued.  4. His mother was given a prescription for rectal Diastat 10 mg and     instructed in its use.  5. During the initial phase of his hospitalization the patient was given IV     Dilantin and IV Depacon. He was switched over to oral once we were     certain that he did not have an ileus.  6. He was  also given Ativan.   FOLLOW UP:  The patient will be seen in one month's time. We will continue  him on  his current medications. In two weeks time he will have trough  gabapentin and valproic level,  SGPC and CBC with differential. The mother  knows to contact me if he has any further difficulties.   DISCHARGE INSTRUCTIONS:  He will return to his regular feeding. He will  return to school tomorrow assuming he remains well.                                              Deanna Artis. Sharene Skeans, M.D.    [facility]  D:  11/22/2001  T:  11/24/2001  Job:  [id number]

## 2010-07-12 NOTE — Discharge Summary (Signed)
[name], [name]               ACCOUNT NO.:  [id number]   MEDICAL RECORD NO.:  [id number]          PATIENT TYPE:  INP   LOCATION:  6114                         FACILITY:  [facility]   PHYSICIAN:  Pediatrics Resident    DATE OF BIRTH:  [birth year]   DATE OF ADMISSION:  08/02/2004  DATE OF DISCHARGE:  08/06/2004                                 DISCHARGE SUMMARY   REASON FOR HOSPITALIZATION:  Cough and increased secretions.   HOSPITAL COURSE:  [name] is a 20 year old male with a past medical history of  congenital CMV, MR, CP, developmental delay, complicated past medical  history and multiple past pneumonia, who presented with one week of cough  and increased secretions.  He was seen by his PCP one week prior to  admission and was started on Omnicef but had minimal improvement on this  medication and was sent for inpatient treatment and further evaluation.  Chest x-ray on admission showed a left lower lobe pneumonia and possible  right lower lobe pneumonia.  Sputum cultures were sent, and he was initially  started on ceftazidime and Zosyn.  These antibiotic choice was picked as he  has not improved on multiple days of Omnicef alone.  He does have a history  of having Pseudomonas in his sputum cultures, and it was thought that this  could possibly be a pseudomonal  pneumonia.  His sputum cultures later came  back growing moderate Pseudomonas that was pansensitive except for  resistance to Septra.  Please see the computer system for the specific  cultures.  He also had blood cultures done at that time, which were negative  to date.  He received five days of IV ceftazidime and Zosyn, and his  symptoms seemed to improve throughout his stay with decreased cough and  decreased amount of secretions during his stay.  During his stay, he did  have an episode while sleeping where he had a desaturation to the low 60s to  50s and, with stimulation, he rose back up again and awoke.  Mom stated that  he  has a history of sleep apnea and is being followed by pediatric pulmonary  at [facility] in [place] for this.  Mom stated that  this is part of his baseline and that he will have episodes like that at  home every once in awhile.  Mom felt that his symptoms have improved  significantly and felt that he was at his baseline.  His O2 saturations  always were in the low 90s without oxygen.  This had been found on previous  hospitalizations as well and was thought to be his baseline.  Mom understood  on a previous hospitalization that he could go home with oxygen and a pulse  oximeter, however declined this and stated that she was fine with his  baseline being on the low 90s.  He was discharged on ciprofloxacin for  coverage of Pseudomonas, only single coverage on discharge, had received  five days of double coverage.  He was also discharged on Omnicef for further  coverage of pneumonia such as community-acquired organisms.  Throughout  his  stay here, he remained afebrile.  Of significance, Josh's J-tube seemed to  be clotting up a lot during his stay.  His mom stated it had been doing that  as well, and he had it changed out by interventional radiology during this  admission.   OPERATIONS AND PROCEDURES:  August 05, 2004, J-tube changed.   TREATMENT:  Five days of IV ceftazidime and Zosyn.   FINAL DIAGNOSES:  1.  Pseudomonas pneumonia.  2.  History of congenital cytomegalovirus.  3.  Mental retardation, cerebral palsy.  4.  Developmental delay.  5.  Cortical blindness.  6.  Scoliosis, status post rods in the past.  7.  History of multiple pneumonias in the past.   DISCHARGE MEDICATIONS:  1.  Ciprofloxacin 450 mg per J-tube b.i.d. x10 days.  2.  ___________ 300 mg via J-tube b.i.d. x10 days.   FOLLOW-UP:  Dr. Donnie Coffin, PCP, in five to seven days.  Mom plans to make this  appointment around her schedule.   DISCHARGE WEIGHT:  31 kg.       PR/MEDQ  D:   08/06/2004  T:  08/06/2004  Job:  [id number]   cc:   Dr. Rachelle Hora [phone]

## 2010-07-12 NOTE — Consult Note (Signed)
NAMEFRANS, VALENTE               ACCOUNT NO.:  [id number]   MEDICAL RECORD NO.:  [id number]          PATIENT TYPE:  REC   LOCATION:  FOOT                         FACILITY:  [facility]   PHYSICIAN:  Theresia Majors. Tanda Rockers, M.D.DATE OF BIRTH:  1990-03-12   DATE OF CONSULTATION:  DATE OF DISCHARGE:                                 CONSULTATION   This dictation has already been completed but when I prompted to get a  number, I was unable to get a number.  This dictation will serve as a  reminder to redictate this consultation if it does not show up in my  today's work.           ______________________________  Theresia Majors. Tanda Rockers, M.D.     Cephus Slater  D:  04/01/2006  T:  04/02/2006  Job:  [id number]

## 2010-07-12 NOTE — Op Note (Signed)
[name], [name]               ACCOUNT NO.:  [id number]   MEDICAL RECORD NO.:  [id number]          PATIENT TYPE:  AMB   LOCATION:  DAY                          FACILITY:  [facility]   PHYSICIAN:  Cleotilde Neer. Jeanella Craze, D.D.S. 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   PROCEDURE PERFORMED:  Full mouth dental rehabilitation.   SPECIMENS:  None.   DRAINS:  None.   CULTURES:  None.   ESTIMATED BLOOD LOSS:  Less than 5 cc.   DESCRIPTION OF PROCEDURE:  The patient was brought from the holding area to  the OR at [facility]. The patient was placed in the supine  position on the operating table. General anesthesia was induced by IV.  Direct nasal tracheal intubation was established. Four intraoral radiographs  were obtained, and a throat pack was placed.   The dental treatment was as follows:  Teeth number 3 and 14 received amalgam  restorations. Scaling and root planing was performed with a Cavitron on all  teeth. All teeth were cleaned with dental pumice tooth paste. The mouth was  thoroughly cleansed, and the throat pack was removed. The patient was  undraped and extubated in the operating room. The patient tolerated the  procedures well and was taken to the PACU in stable condition with IV in  place.       KMP/MEDQ  D:  09/05/2004  T:  09/05/2004  Job:  [id number]

## 2010-07-12 NOTE — Consult Note (Signed)
NAMEKASSIDY, [name]               ACCOUNT NO.:  [id number]   MEDICAL RECORD NO.:  [id number]          PATIENT TYPE:  REC   LOCATION:  FOOT                         FACILITY:  [facility]   PHYSICIAN:  Theresia Majors. Tanda Rockers, M.D.DATE OF BIRTH:  02/18/1991   DATE OF CONSULTATION:  DATE OF DISCHARGE:                                 CONSULTATION   REASON FOR CONSULTATION:  [name] is a 20 year old patient referred by  Dr. Evangeline Gula for evaluation of a non-healing pressure ulcer on the  sacrum.   IMPRESSION:  Stage 1 sacral decubitus.   RECOMMENDATIONS:  Continue implementation of pressure reducing efforts  including rotation of the patient every hour.  Proceed with  repair/replacement of the alternating air mattress to offload the sacrum  and consider orthopedic consultation to rule out impingement from the  Harrington rod.   SUBJECTIVE:  [name] is a 20 year old male who has complications related  to congenital cytomegaly virus inclusion disease.  He is deaf, blind,  and has seizures.  He is well attended and supported by his mother who  is accompanying him today.  Three weeks ago when the patient was unable  to move from the wheelchair to the school room due to inclement weather  and this resulted in him sitting in the chair for a prolonged period of  time.  His mother, afterwards, noted early development of pressure  ulcers and, at the same time, there was malfunctioning of his air  mattress at home.  She instituted local care including the placement of  DuoDerm antiseptic wash and protective ointments.  She reports that  there has been significant improvement since the initial request for  consultation.   His current medication list includes Prevacid 30 mg b.i.d.  glycopyrrolate 1 mg b.i.d., baclofen 10 mg, 3/4 tablet t.i.d., Depakote  125 mg, 3 capsules at 6:00 a.m., 375 mg, 3 capsules at 2:00 a.m., 250  mg, 3 capsules at 9:00 p.m., gabapentin 300 mg t.i.d.   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   His family history has no evidence of additional cytomegalic virus  infected siblings.   The review of systems is remarkable for activity restrictions related to  his mental status.   OBJECTIVE:  On physical exam, he is a chronically ill male accompanied  by his mother. He responds to his mother's presence. He does not speak.  There is some involuntary nystagmus.  The exam is restricted to the  sacral area which shows hyperemia in the distal intergluteal crease.  There is no evidence of ulceration, drainage, or cellulitis.  There is  digital appreciation of firmness consistent with a Harrington rod, but  this does not appear to be associated with the distal hyperemic area.  There is atrophy of the lower extremities.   ASSESSMENT:  Stage 1 sacral decubitus.   PLAN:  We have discussed in detail with [name] a plan to continue  adequate offloading and to avoid progression of this early stage 1  sacral decubitus.  She has been given  reassurance that there is no  evidence that this Harrington rod is connected to the area of hyperemia.  However, we have suggested that final confirmation of this clinical  suspicion should be obtained from the orthopedist.  We have indicated  that we would be more than happy to re-evaluate the Anes if in the  event there is, indeed, a disruption of the skin but in the meantime, we  will assist her in procuring repair and/or replacement of her  alternating air mattress.   [name] expresses gratitude for having been seen in the clinic and  indicates that she will be compliant as per above.  We will re-evaluate  [name] on a p.r.n. basis.           ______________________________  Theresia Majors Tanda Rockers, M.D.     [name]  D:  04/02/2006  T:  04/02/2006  Job:  [id number]   cc:   Maryellen Pile, M.D.

## 2010-07-12 NOTE — Consult Note (Signed)
[name], [name]               ACCOUNT NO.:  [id number]   MEDICAL RECORD NO.:  [id number]          PATIENT TYPE:  REC   LOCATION:  FOOT                         FACILITY:  [facility]   PHYSICIAN:  Theresia Majors. Tanda Rockers, M.D.DATE OF BIRTH:  [birth year]   DATE OF CONSULTATION:  04/01/2006  DATE OF DISCHARGE:                                 CONSULTATION   REASON FOR CONSULTATION:  [name] is a 20 year old patient referred by  Dr. Evangeline Gula for evaluation of nonhealing of the pressure ulcer of the  sacrum.   IMPRESSION:  Stage one sacral decubitus.   RECOMMENDATION:  Continue implementation of pressure reducing efforts  including rotation of the patient every hour.  Proceed with the  repair/replacement of the alternate and air mattress to offload the  sacrum and consider the orthopedic consultation to rule out impingement  from the Harrington rod.   SUBJECTIVE:  [name] is a 20 year old male who has complications related  to congenital cytomegalic inclusion disease.  He is deaf, blind and has  seizures.  He is well attended and supported by his mother who  accompanies him to today's visit.   Three weeks ago, during inclement weather, he was unable to move from  the wheelchair to the school room and ended up sitting in the wheelchair  for a prolonged period of time without attendant and/or pressure relief  from the sacrum.  His mother noted subsequent to this incident that he  developed early pressure ulcers with blister formation and breakdown.  In the interim, she has treated these areas with local cleaning, topical  DuoDerm and a protective salve.  She reports that there has been  improvement.   His past medical history is reflected by his overall neurological  status.   His medication list include   Dictation ended at this point.           ______________________________  Theresia Majors. Tanda Rockers, M.D.     [name]  D:  04/01/2006  T:  04/01/2006  Job:  [id number]

## 2010-07-12 NOTE — Consult Note (Signed)
[name], [name]               ACCOUNT NO.:  [id number]   MEDICAL RECORD NO.:  [id number]          PATIENT TYPE:  INP   LOCATION:  6154                         FACILITY:  [facility]   PHYSICIAN:  Nadara Mustard, MD     DATE OF BIRTH:  1990-12-21   DATE OF CONSULTATION:  05/16/2004  DATE OF DISCHARGE:                                   CONSULTATION   HISTORY OF PRESENT ILLNESS:  The patient is a 20 year old gentleman with  spastic involvement of his upper and lower extremities secondary to CP from  a neonatal infection.  By report, the patient's father was trying to lift  the patient yesterday when he heard a pop in the knee.  The patient was  admitted for respiratory symptoms as well as for swelling in the right knee.   VITAL SIGNS:  Temperature 36.2 axillary, pulse 141, respiratory rate 34,  blood pressure 112/56.   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   He has history of hearing impairment, pancreatitis, J-tube placed in 2002,  Nissen fundoplasty as well as spinal fusion.  NEUROLOGIC:  history of  seizures with severe neurologic devastation.  CARDIOVASCULAR, HEMATOLOGY:  No problems.  PULMONARY:  History of pneumonia, currently with pneumonia  symptoms.  MUSCULOSKELETAL:  He has contractures in both upper and lower  extremities with current swelling of the right knee.  GASTROINTESTINAL:  History of constipation and diarrhea.  ENDOCRINE:  No problems.  NUTRITION:  Fed through a J-tube with an Product/process development scientist.   OBJECTIVE:  On examination, the patient has spastic contractures of both  upper and lower extremities.  He has knee flexion contractures as well as  contractures of both ankles.  Examination of the left knee:  He has a knee  contracture.  There is no swelling, no tenderness to palpation, no redness,  no warmth, no skin color changes.  Examination of the right knee:  There was  swelling around the right knee.  He  is tender to palpation over the proximal  tibia.  There is warmth and mild bruising.  With attempted range of motion  of the knee, there is range of motion at the metaphysis of the tibia with no  motion going through the knee joint.   Radiographs were obtained, which show a metaphyseal fracture just distal to  the physis of the right knee.   ASSESSMENT:  Right proximal tibial plateau fracture.   PLAN:  The patient will be placed in a posterior splint with the knee flexed  at 90 degrees.  Plan to follow up in the office in two weeks.  Orders were  written to check the proximal and distal aspects of the splint to ensure  there is no skin breakdown.  Plan for radiographs with follow-up at the  office.      MVD/MEDQ  D:  05/16/2004  T:  05/17/2004  Job:  [id number]

## 2010-07-15 ENCOUNTER — Ambulatory Visit
Admission: RE | Admit: 2010-07-15 | Discharge: 2010-07-15 | Disposition: A | Discharge status: Home / Self Care | Payer: BC Managed Care – PPO | Source: Ambulatory Visit | Attending: Family Medicine | Admitting: Family Medicine

## 2010-07-15 ENCOUNTER — Encounter: Payer: Self-pay | Admitting: Family Medicine

## 2010-07-15 ENCOUNTER — Ambulatory Visit (INDEPENDENT_AMBULATORY_CARE_PROVIDER_SITE_OTHER): Payer: BC Managed Care – PPO | Admitting: Family Medicine

## 2010-07-15 VITALS — HR 114 | Temp 98.2°F

## 2010-07-15 DIAGNOSIS — J159 Unspecified bacterial pneumonia: Secondary | ICD-10-CM

## 2010-07-15 MED ORDER — AZITHROMYCIN 200 MG/5ML PO SUSR: ORAL | Status: AC

## 2010-07-15 MED ORDER — AZITHROMYCIN 500 MG PO TABS: ORAL_TABLET | ORAL | Status: DC

## 2010-07-15 NOTE — Progress Notes (Signed)
  Subjective:     [name] is a 20 y.o. male here for evaluation of a cough. Onset of symptoms was 1 day ago. Symptoms have been gradually worsening since that time. The cough is harsh and productive of clear sputum and is aggravated by nothing. Associated symptoms include: unable to delineate due to cerebral palsy. Patient does have a history of asthma. Patient does not have a history of environmental allergens. Patient has not traveled recently. Patient does not have a history of smoking. Patient has not had a previous chest x-ray. Patient has not had a PPD done.  This is a pt with known CP, asthma and epilepsy with acute onset of a cough with fever, temp 100.6 max, this am treated with advil or tylenol.  Pt awoke this am clammy and has not had any history of UTIs.    The following portions of the patient's history were reviewed and updated as appropriate: allergies, current medications, past family history, past medical history, past social history, past surgical history and problem list.  Review of Systems Pertinent items are noted in HPI.    Objective:    Oxygen saturation 95% on room air Pulse 114  Temp(Src) 98.2 F (36.8 C) (Axillary)  SpO2 92% General appearance: cooperative Head: Normocephalic, without obvious abnormality, atraumatic Throat: lips, mucosa, and tongue normal; teeth and gums normal Neck: no adenopathy, no carotid bruit, no JVD, supple, symmetrical, trachea midline and thyroid not enlarged, symmetric, no tenderness/mass/nodules Lungs: diminished breath sounds LLL and wheezes LLL Heart: regular rate and rhythm, S1, S2 normal, no murmur, click, rub or gallop Abdomen: soft, non-tender; bowel sounds normal; no masses,  no organomegaly    Assessment:    Cough -possible pneumonia due to decreased breath sounds but possible viral etiology due to concomitant wheezing.  Will treat for bacterial etiology due to other comorbidities and risk factors.     Plan:    Explained lack of efficacy of antibiotics in viral disease. Avoid exposure to tobacco smoke and fumes. B-agonist inhaler. Call if shortness of breath worsens, blood in sputum, change in character of cough, development of fever or chills, inability to maintain nutrition and hydration. Avoid exposure to tobacco smoke and fumes. Chest x-ray. z pak and return to clinic in one week for follow up.   Pt given azithromycin liquid due to PEG tube feeding.

## 2010-07-15 NOTE — Patient Instructions (Signed)
Cough, Bacterial You have been seen today for a cough. Your caregiver feels that your cough is caused by germs (bacterial). Often, infections of the lungs are caused by bacteria. Usually, these infections are caused by inhaling bacteria into your throat and lungs.   SYMPTOMS The most common symptoms are cough, fever, chest pain, increased breathing rate, wheezing, and colored secretions you cough up (sputum). These infections are diagnosed by your caregiver physically examining you.  DIAGNOSIS These infections are diagnosed by your caregiver physically examining you. Other times the diagnosis may require chest X-rays, blood analysis, and/or cultures. The bacterial pneumonias are usually cleared up with medications which kill germs (antibiotics). The following information may or may not apply to you. It is possible that some of your lab work or X-rays may needed to make a final diagnosis. Sometimes some of these tests require repeating.  X-rays may have been taken today and will be read again by a radiologist. The official reading will be back in 1 to 2 days.   Lab work may have been done today and should be back within 24 hours. Call in 1 day for your lab results or as directed.   Cultures may have been done today. These generally take at least 2 days for results. Call in 2 to 3 days for your results or as directed.   You may have had special procedures done today and the results will not be available for a few days. Call for your results as directed.  Not all test results are available during your visit. If your test results are not back during the visit, make an appointment with your caregiver to find out the results. Do not assume everything is normal if you have not heard from your caregiver or the medical facility. It is important for you to follow up on all of your test results.  HOME CARE INSTRUCTIONS  Cough suppressants may be used as directed by your caregiver. Keep in mind that  coughing helps clear mucus and infection out of the respiratory tract. It is best to only use cough suppressants to allow your child to rest. For children under the age of 4 years, use cough suppressants only as directed by your child's caregiver.   Your caregiver usually prescribes an antibiotic if they feel your cough is caused by a bacterial infection. Take all your antibiotics as directed until they are gone, even if you feel better in a couple of days.   Your caregiver may also prescribe an expectorant to loosen the mucous (sputum).   Only take over-the-counter or prescription medicines for pain, discomfort, or fever as directed by your caregiver.   Do not use aspirin with children because of the association with Reye's Syndrome. The cough could be caused by a flu bug (virus).   Smoking is a common cause of bronchitis and can contribute to pneumonia. Stopping this habit is important.   A cold steam vaporizer or humidifier in your room may help loosen secretions.   Cough is often worse at night. Sleeping in a semi-upright position or using a couple pillows under your head and shoulders will help with this.   Get rest as you feel it is needed. Your body will help guide you in this manner.  SEEK IMMEDIATE MEDICAL CARE IF:  You develop pus-like or colored (purulent) sputum, have an uncontrolled fever, or become more ill. This is especially true if you are elderly or weakened from any other disease.   You can not  control your cough with suppressants and are losing sleep.   You begin coughing up blood.   You develop chest pain that is getting worse or is uncontrolled with medicines.   Anytime it becomes difficult to breathe (shortness of breath).   You develop an oral temperature above 101, after being not having a temperature for one or more days.   If any of the symptoms that first brought you in for care are getting worse rather than better.  MAKE SURE YOU:   Understand these  instructions.   Will watch your condition.   Will get help right away if you are not doing well or get worse.  Document Released: 05/03/2003 Document Re-Released: 07/31/2009 [facility] Patient Information 2011 [place], [place].Place cough patient instructions here.

## 2010-07-23 ENCOUNTER — Encounter: Payer: Self-pay | Admitting: Family Medicine

## 2010-07-23 ENCOUNTER — Ambulatory Visit (INDEPENDENT_AMBULATORY_CARE_PROVIDER_SITE_OTHER): Payer: BC Managed Care – PPO | Admitting: Family Medicine

## 2010-07-23 DIAGNOSIS — J45909 Unspecified asthma, uncomplicated: Secondary | ICD-10-CM

## 2010-07-23 DIAGNOSIS — J189 Pneumonia, unspecified organism: Secondary | ICD-10-CM

## 2010-07-23 DIAGNOSIS — Z8701 Personal history of pneumonia (recurrent): Secondary | ICD-10-CM | POA: Insufficient documentation

## 2010-07-23 MED ORDER — BUDESONIDE 0.25 MG/2ML IN SUSP: 0.2500 mg | Freq: Two times a day (BID) | RESPIRATORY_TRACT | Status: DC | PRN

## 2010-07-23 NOTE — Assessment & Plan Note (Signed)
Completed course of azithromycin. Symptomatically improved. Will continue aggressive chest PT given exam. Advised to follow up in one month with Dr. Hulen Luster. Will treat asthma as below.

## 2010-07-23 NOTE — Patient Instructions (Signed)
Follow up in one month with Dr. Hulen Luster Continue to do aggressive chest PT for the next 5 days  We will make the referral for you with an adult pulmonologist  If you notice that Tauheed is having trouble breathing, is lethargic or has a fever bring him in to be seen

## 2010-07-23 NOTE — Assessment & Plan Note (Signed)
Will restart inhaled corticosteroid (has not been taking). Follow up with Dr. Hulen Luster in one month. Referral to adult pulmonologist as well. Continue as needed albuterol.

## 2010-07-23 NOTE — Progress Notes (Signed)
  Subjective:    Patient ID: ARA MANO, male    DOB: 1990/09/06, 19 y.o.   MRN: [id number]  HPI  1) Cough: Seen on 5/21 with cough, fever to 100.6, lethargy - diagnosed with left lower lobe pneumonia (clinically and by plain films) , placed on 5 day course of azithromycin. He has been producing yellow sputum and has needed to be suctioned more frequently, though his fever and breathing and level of activity have improved. He had been having chest PT every 4 hours while awake for the first 3 days of this illness, now he is back to twice a day. 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 History of cerebral palsy and seizure disorder as well.   Pertinent past medical history reviewed   Review of Systems As per HPI     Objective:   Physical Exam General appearance: cooperative Throat: lips, mucosa, and tongue normal; teeth with severe decay; high-arched palate  Neck: no adenopathy, no carotid bruit, no JVD, supple, symmetrical, trachea midline and thyroid not enlarged, symmetric, no tenderness/mass/nodules Lungs: coarse upper airway noise noted; diminished breath sounds LLL without wheezes or rales Heart: regular rate and rhythm, S1, S2 normal, no murmur, click, rub or gallop Abdomen: soft, non-tender; bowel sounds normal; no masses,  no organomegaly; "J"-tube in place         Assessment & Plan:

## 2010-08-07 ENCOUNTER — Telehealth: Payer: Self-pay | Admitting: Family Medicine

## 2010-08-07 NOTE — Telephone Encounter (Signed)
Needs new rx for his suction machine - all new suction unit supplies and nebulizer supplies Please fax script to : [phone]

## 2010-08-13 ENCOUNTER — Other Ambulatory Visit (HOSPITAL_COMMUNITY): Payer: Self-pay | Admitting: Interventional Radiology

## 2010-08-13 ENCOUNTER — Ambulatory Visit (HOSPITAL_COMMUNITY)
Admission: RE | Admit: 2010-08-13 | Discharge: 2010-08-13 | Disposition: A | Discharge status: Home / Self Care | Payer: BC Managed Care – PPO | Source: Ambulatory Visit | Attending: Interventional Radiology | Admitting: Interventional Radiology

## 2010-08-13 DIAGNOSIS — Z434 Encounter for attention to other artificial openings of digestive tract: Secondary | ICD-10-CM | POA: Insufficient documentation

## 2010-08-13 DIAGNOSIS — Z931 Gastrostomy status: Secondary | ICD-10-CM

## 2010-08-13 DIAGNOSIS — G808 Other cerebral palsy: Secondary | ICD-10-CM | POA: Insufficient documentation

## 2010-08-13 MED ORDER — IOHEXOL 300 MG/ML  SOLN
50.0000 mL | Freq: Once | INTRAMUSCULAR | Status: AC | PRN
  Administered 2010-08-13: 5 mL via INTRAVENOUS

## 2010-08-26 ENCOUNTER — Encounter: Payer: Self-pay | Admitting: Family Medicine

## 2010-08-26 ENCOUNTER — Ambulatory Visit (INDEPENDENT_AMBULATORY_CARE_PROVIDER_SITE_OTHER): Payer: BC Managed Care – PPO | Admitting: Family Medicine

## 2010-08-26 VITALS — HR 98 | Temp 97.5°F

## 2010-08-26 DIAGNOSIS — J189 Pneumonia, unspecified organism: Secondary | ICD-10-CM

## 2010-08-26 NOTE — Patient Instructions (Signed)
[facility] Address: [address] [place], [place] [address] Phone: [phone]   I will put in a referral in for pulmonology. Please let me know how I can help with [name].  I am happy to fill out forms or refill medications as needed.  You are doing a great job with him!

## 2010-08-27 ENCOUNTER — Encounter: Payer: Self-pay | Admitting: Family Medicine

## 2010-08-27 NOTE — Progress Notes (Signed)
  Subjective:    Patient ID: [name], male    DOB: 23-Jun-1990, 20 y.o.   MRN: [id number]  HPI  F/u PNA.  Per mom, breathing was better a few days after last visit. No new cough or fevers.  Recently d/c from pediatric pulm and mom wants referral to adult pulm for f/u of frequent PNA and asthma.    Review of Systems    no cough, fever, change in weight or BMs Objective:   Physical Exam Vital signs reviewed General appearance - alert,in no distress well dressed, clean Chest - clear to auscultation, no wheezes, rales or rhonchi, symmetric air entry, no tachypnea, retractions or cyanosis Heart - normal rate, regular rhythm, normal S1, S2, no murmurs, rubs, clicks or gallops        Assessment & Plan:

## 2010-09-17 ENCOUNTER — Other Ambulatory Visit (HOSPITAL_COMMUNITY): Payer: Self-pay | Admitting: Interventional Radiology

## 2010-09-17 ENCOUNTER — Ambulatory Visit (HOSPITAL_COMMUNITY)
Admission: RE | Admit: 2010-09-17 | Discharge: 2010-09-17 | Disposition: A | Discharge status: Home / Self Care | Payer: BC Managed Care – PPO | Source: Ambulatory Visit | Attending: Interventional Radiology | Admitting: Interventional Radiology

## 2010-09-17 DIAGNOSIS — Z434 Encounter for attention to other artificial openings of digestive tract: Secondary | ICD-10-CM | POA: Insufficient documentation

## 2010-09-17 DIAGNOSIS — Z931 Gastrostomy status: Secondary | ICD-10-CM

## 2010-09-17 MED ORDER — IOHEXOL 300 MG/ML  SOLN
50.0000 mL | Freq: Once | INTRAMUSCULAR | Status: AC | PRN
  Administered 2010-09-17: 10 mL

## 2010-09-25 ENCOUNTER — Ambulatory Visit (INDEPENDENT_AMBULATORY_CARE_PROVIDER_SITE_OTHER): Payer: BC Managed Care – PPO | Admitting: Emergency Medicine

## 2010-09-25 ENCOUNTER — Encounter: Payer: Self-pay | Admitting: Emergency Medicine

## 2010-09-25 DIAGNOSIS — G809 Cerebral palsy, unspecified: Secondary | ICD-10-CM

## 2010-09-25 DIAGNOSIS — G4733 Obstructive sleep apnea (adult) (pediatric): Secondary | ICD-10-CM

## 2010-09-25 NOTE — Patient Instructions (Signed)
Do not restart Pulmicort  Use albuterol nebulizers as needed Continue your suctioning as needed Continue your Chest Vest twice a day.  Follow up with Dr Delton Coombes in 1 year or sooner if you have any problems.

## 2010-09-25 NOTE — Assessment & Plan Note (Addendum)
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

## 2010-09-25 NOTE — Progress Notes (Signed)
Subjective:    Patient ID: [name], male    DOB: 10-13-1990, 20 y.o.   MRN: [id number]  HPI 20 yo man, hx of cerebral palsy. He is dependent for his care, lives at home with mom and dad. Had been followed for secretion management, recurrent PNA's by Dr Donnie Coffin at [facility]. Never required intubation. Followed by Dr Sharene Skeans with Neurology. He gets suctioned occasionally but not frequently (few times a month). Not currently using pulmicort nebs at this time. He is on albuterol nebs prn. Uses chest vest bid.    Review of Systems  Constitutional: Negative for fever, activity change, appetite change and fatigue.  HENT: Positive for congestion. Negative for rhinorrhea, sneezing, postnasal drip and sinus pressure.   Respiratory: Negative for cough (baseline, in the am - prod clear/white), chest tightness, shortness of breath and stridor.   Cardiovascular: Negative for chest pain.  Musculoskeletal: Negative for back pain.    Past Medical History  Diagnosis Date  . Allergy   . Asthma   . Cerebral palsy, quadriplegic   . Congenital CMV infection   . Pneumonia   . Seizures      Family History  Problem Relation Age of Onset  . Allergies Mother   . Heart disease Maternal Uncle   . Colon cancer Paternal Grandmother      History   Social History  . Marital Status: Single    Spouse Name: N/A    Number of Children: N/A  . Years of Education: N/A   Occupational History  . Not on file.   Social History Main Topics  . Smoking status: Never Smoker   . Smokeless tobacco: Not on file  . Alcohol Use: No  . Drug Use: No  . Sexually Active: No   Other Topics Concern  . Not on file   Social History Narrative  . No narrative on file     Allergies  Allergen Reactions  . Sulfamethoxazole W/Trimethoprim      Outpatient Prescriptions Prior to Visit  Medication Sig Dispense Refill  . albuterol (PROAIR HFA) 108 (90 BASE) MCG/ACT inhaler Inhale 2 puffs into the lungs every 4  (four) hours as needed.        Marland Kitchen albuterol (PROVENTIL) (2.5 MG/3ML) 0.083% nebulizer solution Take 2.5 mg by nebulization every 4 (four) hours as needed.        . baclofen (LIORESAL) 10 MG tablet 2 tabs per tube three times a day       . budesonide (PULMICORT) 0.25 MG/2ML nebulizer solution Take 2 mLs (0.25 mg total) by nebulization 2 (two) times daily as needed.  60 mL  3  . diazepam (DIASTAT) 2.5 MG GEL Unknown dose. Prescribed by Dr. Sharene Skeans, neurology       . divalproex (DEPAKOTE SPRINKLE) 125 MG capsule 3 caps per tube at 6 am, 2 caps per tube at 2 pm, 3 caps per tube at 9 pm       . gabapentin (NEURONTIN) 300 MG capsule 1 tab per tube three times a day       . glycopyrrolate (ROBINUL) 1 MG tablet 1 tab per tube three times a day for lung secretions       . lansoprazole (PREVACID) 30 MG capsule 1 tab per tube two times a day       . polyethylene glycol (MIRALAX / GLYCOLAX) packet Take 1 capful in 8 ozs water as needed  Once daily to twice daily for constipation  100 each  3  Objective:   Physical Exam  Gen: Debilitated man in wheelchair, non-communicative  ENT: No lesions,  mouth clear,  oropharynx clear, poor dentition, no postnasal drip, L eye lateral gaze  Neck: No stridor, UA appears to be clear  Lungs: No use of accessory muscles, no dullness to percussion, clear without rales or rhonchi  Cardiovascular: RRR, heart sounds normal, no murmur or gallops, no peripheral edema  Abdomen: PEG in place.   Musculoskeletal: No deformities, no cyanosis or clubbing  Neuro: alert, non focal  Skin: Warm, no lesions or rashes     Assessment & Plan:  CEREBRAL PALSY Complicated by impaired secretion management in PNA';s - would defer pulmicort, would probably increase risk for PNA's, no true dx of asthma here - albuterol prn - suctioning prn - discussed the probable evolution of his condition at some point to trach; parents are discussing this - chest vest bid   SLEEP  APNEA, OBSTRUCTIVE No longer on CPAP

## 2010-09-25 NOTE — Assessment & Plan Note (Signed)
-  No longer on CPAP °

## 2010-10-05 ENCOUNTER — Other Ambulatory Visit: Payer: Self-pay | Admitting: Family Medicine

## 2010-10-06 NOTE — Telephone Encounter (Signed)
Refill request

## 2010-10-08 ENCOUNTER — Telehealth: Payer: Self-pay | Admitting: Emergency Medicine

## 2010-10-08 NOTE — Telephone Encounter (Signed)
Received copies from Pediatric Pulmonologist @ [facility] on 10/03/10. Forwarded  6pages to Dr. Delton Coombes for review.

## 2010-10-11 ENCOUNTER — Telehealth: Payer: Self-pay | Admitting: Family Medicine

## 2010-10-11 NOTE — Telephone Encounter (Signed)
Mother dropped off Medication form to be filled out for school.  Please call her when completed.

## 2010-10-11 NOTE — Telephone Encounter (Signed)
Placed in Dr. Spiegel's box for completion. 

## 2010-10-14 ENCOUNTER — Other Ambulatory Visit: Payer: Self-pay | Admitting: Family Medicine

## 2010-10-14 MED ORDER — BACLOFEN 10 MG PO TABS: 20.0000 mg | ORAL_TABLET | Freq: Three times a day (TID) | ORAL | Status: DC

## 2010-10-17 ENCOUNTER — Telehealth: Payer: Self-pay | Admitting: Family Medicine

## 2010-10-17 ENCOUNTER — Other Ambulatory Visit: Payer: Self-pay | Admitting: Family Medicine

## 2010-10-17 NOTE — Telephone Encounter (Signed)
Mother dropped off another medication form to be filled out for school.  I was unable to locate the first one.  Please fax it when completed.  They need it tomorrow because school starts on Monday.  Also, rx was called in incorrectly.  See patient form.

## 2010-10-17 NOTE — Telephone Encounter (Signed)
Form placed in MD box.

## 2010-10-17 NOTE — Telephone Encounter (Signed)
Refill request

## 2010-10-18 ENCOUNTER — Other Ambulatory Visit: Payer: Self-pay | Admitting: Family Medicine

## 2010-10-18 MED ORDER — BACLOFEN 10 MG PO TABS: 20.0000 mg | ORAL_TABLET | Freq: Three times a day (TID) | ORAL | Status: DC

## 2010-10-18 NOTE — Telephone Encounter (Signed)
Completed and mother notified.

## 2010-10-21 ENCOUNTER — Ambulatory Visit (HOSPITAL_COMMUNITY)
Admission: RE | Admit: 2010-10-21 | Discharge: 2010-10-21 | Disposition: A | Discharge status: Home / Self Care | Payer: BC Managed Care – PPO | Source: Ambulatory Visit | Attending: Family Medicine | Admitting: Family Medicine

## 2010-10-21 ENCOUNTER — Other Ambulatory Visit: Payer: Self-pay | Admitting: Family Medicine

## 2010-10-21 DIAGNOSIS — G809 Cerebral palsy, unspecified: Secondary | ICD-10-CM

## 2010-10-21 DIAGNOSIS — Z434 Encounter for attention to other artificial openings of digestive tract: Secondary | ICD-10-CM | POA: Insufficient documentation

## 2010-10-21 MED ORDER — IOHEXOL 300 MG/ML  SOLN
50.0000 mL | Freq: Once | INTRAMUSCULAR | Status: AC | PRN
  Administered 2010-10-21: 10 mL via INTRAVENOUS

## 2010-11-29 LAB — CBC
MCHC: 34.4
RBC: 4.68
WBC: 7.3

## 2010-11-29 LAB — DIFFERENTIAL
Basophils Absolute: 0
Eosinophils Absolute: 0 — ABNORMAL LOW
Eosinophils Relative: 0
Lymphocytes Relative: 28
Monocytes Absolute: 1.3 — ABNORMAL HIGH
Monocytes Relative: 18 — ABNORMAL HIGH
Neutro Abs: 3.9
Neutrophils Relative %: 54

## 2010-11-29 LAB — CULTURE, BLOOD (ROUTINE X 2)

## 2010-12-06 ENCOUNTER — Ambulatory Visit (INDEPENDENT_AMBULATORY_CARE_PROVIDER_SITE_OTHER): Payer: BC Managed Care – PPO | Admitting: Family Medicine

## 2010-12-06 ENCOUNTER — Ambulatory Visit
Admission: RE | Admit: 2010-12-06 | Discharge: 2010-12-06 | Disposition: A | Discharge status: Home / Self Care | Payer: BC Managed Care – PPO | Source: Ambulatory Visit | Attending: Family Medicine | Admitting: Family Medicine

## 2010-12-06 ENCOUNTER — Encounter: Payer: Self-pay | Admitting: Family Medicine

## 2010-12-06 VITALS — BP 106/69 | HR 107 | Temp 97.5°F

## 2010-12-06 DIAGNOSIS — R05 Cough: Secondary | ICD-10-CM

## 2010-12-06 MED ORDER — AZITHROMYCIN 500 MG PO TABS: 500.0000 mg | ORAL_TABLET | Freq: Every day | ORAL | Status: AC

## 2010-12-06 NOTE — Assessment & Plan Note (Addendum)
Cough and fever with inability to clear airways without some suctioning. Given that he has some exam findings, I will get a chest x-ray. I would like to make sure he does not have a pneumonia. If his chest x-ray is clear, I plan to have him take symptomatic treatment like Tylenol. I have given red flags for return. I reviewed his previous pulmonology note. If change his medication list to reflect their changes. Patient is a nonsmoker, and he is dependent on his mother for care. He has a complicated medical history of cerebral palsy and recurrent pneumonias in the past.

## 2010-12-06 NOTE — Patient Instructions (Signed)
I am sorry he is not feeling well today. I am sending you for a chest x-ray today. I will call you as soon as I have that read back. If we see a pneumonia, I will prescribe antibiotics. If there is nothing on the chest x-ray, we will treat him as if he has a virus. Please give him Tylenol as needed for fever and pain, and make sure he stays hydrated. If he seems to be struggling, or getting worse, We would like to see him again.

## 2010-12-06 NOTE — Progress Notes (Signed)
CXR showed changes consistent with pneumonia. I called his mother and told her the results. I told her I would call in azithromycin for him. She states that she is comfortable knowing what he might need emergency care. I told her to make an appointment for Monday or Tuesday to be seen in the clinic if he still having trouble.

## 2010-12-06 NOTE — Progress Notes (Signed)
  Subjective:    Patient ID: [name], male    DOB: February 10, 1991, 20 y.o.   MRN: [id number]  HPI Patient presents today with mother. Mom is concerned because he's had cough x3 days and fever times one day. Patient has seemed to be gasping for breath in the morning. He has an increasingly productive cough. Albuterol helps somewhat. His fever this morning was 101. She gave Tylenol for this. He has been using his albuterol more than he normally does. He seemed tired and not able to do his normal activities. He has a history of recurrent pneumonias. At his last pulmonology appointment, his Pulmicort was stopped.  Review of Systems    denies CP or HA Objective:   Physical Exam Vital signs reviewed General appearance - tired, minimally interactive Heart - normal rate, regular rhythm, normal S1, S2, no murmurs, rubs, clicks or gallops Lungs-normal lung sounds at the bases bilaterally. There is some rhonchorous sounds heard at the upper lung fields. No crackles or wheezes.       Assessment & Plan:

## 2010-12-06 NOTE — Progress Notes (Signed)
Addended by: Ellery Plunk L on: 12/06/2010 12:53 PM   Modules accepted: Orders

## 2010-12-09 ENCOUNTER — Ambulatory Visit (INDEPENDENT_AMBULATORY_CARE_PROVIDER_SITE_OTHER): Payer: BC Managed Care – PPO | Admitting: Family Medicine

## 2010-12-09 DIAGNOSIS — R05 Cough: Secondary | ICD-10-CM

## 2010-12-09 DIAGNOSIS — R059 Cough, unspecified: Secondary | ICD-10-CM

## 2010-12-09 NOTE — Assessment & Plan Note (Signed)
Pneumonia improved.  Advised continuing with supportive care per pulmonology.

## 2010-12-09 NOTE — Progress Notes (Signed)
  Subjective:    Patient ID: [name], male    DOB: Nov 02, 1990, 20 y.o.   MRN: [id number]  HPI Here for 3 day follow-up of cough and dyspnea  20 year old patient with CP with history of frequent pneumonias.  Was seen on Friday, chest x-ray showed a left lower lobe possible pneumonia. Patient was started on a 3 day course of azithromycin. At that time patient was exhibiting decreased activity and more somnolence.  Patient's father here todayis back at his baseline behavior. He is coughing some without any production. He continues to use chest PT and albuterol.  Review of Systems See history of present illness    Objective:   Physical Exam GEN: Alert & Oriented, No acute distress.  IN wheelchair, CP-contractures.  Nonverbal CV:  Regular Rate & Rhythm, no murmur Respiratory:  Normal work of breathing, CTAB, decreased air movement throughout.       Assessment & Plan:

## 2010-12-09 NOTE — Patient Instructions (Signed)
Please return if [name] starts to get sick again May return to his regular chest PT treatment

## 2010-12-11 ENCOUNTER — Ambulatory Visit (INDEPENDENT_AMBULATORY_CARE_PROVIDER_SITE_OTHER): Payer: BC Managed Care – PPO | Admitting: *Deleted

## 2010-12-11 DIAGNOSIS — Z23 Encounter for immunization: Secondary | ICD-10-CM

## 2010-12-20 ENCOUNTER — Other Ambulatory Visit (HOSPITAL_COMMUNITY): Payer: Self-pay | Admitting: Interventional Radiology

## 2010-12-20 ENCOUNTER — Ambulatory Visit (HOSPITAL_COMMUNITY)
Admission: RE | Admit: 2010-12-20 | Discharge: 2010-12-20 | Disposition: A | Discharge status: Home / Self Care | Payer: BC Managed Care – PPO | Source: Ambulatory Visit | Attending: Interventional Radiology | Admitting: Interventional Radiology

## 2010-12-20 DIAGNOSIS — G809 Cerebral palsy, unspecified: Secondary | ICD-10-CM

## 2010-12-20 DIAGNOSIS — Z434 Encounter for attention to other artificial openings of digestive tract: Secondary | ICD-10-CM | POA: Insufficient documentation

## 2010-12-27 ENCOUNTER — Ambulatory Visit (INDEPENDENT_AMBULATORY_CARE_PROVIDER_SITE_OTHER): Payer: BC Managed Care – PPO | Admitting: Family Medicine

## 2010-12-27 DIAGNOSIS — R0602 Shortness of breath: Secondary | ICD-10-CM

## 2010-12-27 NOTE — Progress Notes (Signed)
  Subjective:    Patient ID: [name], male    DOB: [birth year], 20 y.o.   MRN: [id number]  HPI 20 year old male with past medical history significant for cerebral palsy here for same day appointment for evaluation of acute onset of dyspnea now resolved.  He is presents today with his mom who stated that he was in his usual state of health when she received a call from school that he had an acute onset of dyspnea. When she arrived at the school 15 minutes later he continues to be dyspneic. She states the school said his pulse ox was low and resolved after an albuterol treatment and use of supplementary oxygen.  Currently he has no dyspnea but continues to have some congestion. He is breathing comfortably but mom states he is less responsive than he usually is.   Review of Systems no fevers, chills, nausea, vomiting, sputum     Objective:   Physical Exam  GEN: wheelchair bound, at baseline mom states will smile at voice and touch.  Currently opens eyes, moves spontaneously.   CV:  Regular Rate & Rhythm, no murmur Respiratory:  Normal work of breathing, upper airway congestion, no wheezes, rhonchi, or consolidation.  Fair air movement throughout        Assessment & Plan:

## 2010-12-27 NOTE — Assessment & Plan Note (Signed)
Patient had an intermittent dyspneic episode, now resolved.  P02 good, mom states he is somewhat more lethargic than usual.    No signs of URI, possibly was an episode of aspiration or mucous plugging, now resolved.  We discussed options to include broad spectrum antibiotics for aspiration vs watchful waiting over the next day.  We decided that given comorbidity of repeated doses of broad spectrum antibiotics, will watch and see if he shows any objective signs of illness.  If she notices and fever, dyspnea, or he does not fully recover his previous level of responsiveness, to take him to ER for further evaluation and starting tx.  They will continue albuterol prn and chest PT.

## 2010-12-27 NOTE — Patient Instructions (Signed)
If you notice fever, worsening breathing, or lethargy, please take him to ER  Get in touch with your pulmonologist

## 2010-12-30 ENCOUNTER — Telehealth: Payer: Self-pay | Admitting: Family Medicine

## 2010-12-30 NOTE — Telephone Encounter (Signed)
Calling back to see how [name] did over the weekend..  Left message on voicemail

## 2011-03-07 ENCOUNTER — Telehealth: Payer: Self-pay | Admitting: Family Medicine

## 2011-03-07 NOTE — Telephone Encounter (Signed)
Form dropped off to be filled out for neck brace.  Please call when completed.

## 2011-03-07 NOTE — Telephone Encounter (Signed)
[employer] form for new neck brace placed in Dr. Jeri Lager box for completion.  Ileana Ladd

## 2011-03-11 NOTE — Telephone Encounter (Signed)
Completed.  To [name]

## 2011-03-11 NOTE — Telephone Encounter (Signed)
[name] notified [employer] forms for neck brace are ready to be picked up at front desk.  Ileana Ladd

## 2011-05-14 ENCOUNTER — Ambulatory Visit (HOSPITAL_COMMUNITY)
Admission: RE | Admit: 2011-05-14 | Discharge: 2011-05-14 | Disposition: A | Discharge status: Home / Self Care | Payer: BC Managed Care – PPO | Source: Ambulatory Visit | Attending: Interventional Radiology | Admitting: Interventional Radiology

## 2011-05-14 ENCOUNTER — Other Ambulatory Visit (HOSPITAL_COMMUNITY): Payer: Self-pay | Admitting: Interventional Radiology

## 2011-05-14 DIAGNOSIS — G809 Cerebral palsy, unspecified: Secondary | ICD-10-CM | POA: Insufficient documentation

## 2011-05-14 DIAGNOSIS — Z434 Encounter for attention to other artificial openings of digestive tract: Secondary | ICD-10-CM | POA: Insufficient documentation

## 2011-05-14 NOTE — Procedures (Signed)
J tube exchange 

## 2011-05-29 ENCOUNTER — Telehealth: Payer: Self-pay | Admitting: Family Medicine

## 2011-05-29 NOTE — Telephone Encounter (Signed)
[employer] Professional Verification Form to be completed by [employer].

## 2011-05-29 NOTE — Telephone Encounter (Signed)
[employer] Professional Verification Form placed in Dr. Jeri Lager box for completion.  Ileana Ladd

## 2011-06-04 NOTE — Telephone Encounter (Signed)
Completed and pt called

## 2011-06-11 ENCOUNTER — Telehealth: Payer: Self-pay | Admitting: Family Medicine

## 2011-06-11 NOTE — Telephone Encounter (Signed)
Called mom to confirm the Dr. Sharene Skeans is covering his Depakote. She states that this is true

## 2011-06-30 ENCOUNTER — Encounter (HOSPITAL_COMMUNITY): Payer: Self-pay

## 2011-06-30 ENCOUNTER — Emergency Department (HOSPITAL_COMMUNITY): Payer: BC Managed Care – PPO

## 2011-06-30 ENCOUNTER — Inpatient Hospital Stay (HOSPITAL_COMMUNITY)
Admission: EM | Admit: 2011-06-30 | Discharge: 2011-07-08 | DRG: 540 | Disposition: A | Discharge status: Home Health | Payer: BC Managed Care – PPO | Source: Ambulatory Visit | Attending: Family Medicine | Admitting: Family Medicine

## 2011-06-30 DIAGNOSIS — Z8701 Personal history of pneumonia (recurrent): Secondary | ICD-10-CM

## 2011-06-30 DIAGNOSIS — G809 Cerebral palsy, unspecified: Secondary | ICD-10-CM

## 2011-06-30 DIAGNOSIS — Z66 Do not resuscitate: Secondary | ICD-10-CM | POA: Diagnosis not present

## 2011-06-30 DIAGNOSIS — G4733 Obstructive sleep apnea (adult) (pediatric): Secondary | ICD-10-CM | POA: Diagnosis present

## 2011-06-30 DIAGNOSIS — R05 Cough: Secondary | ICD-10-CM

## 2011-06-30 DIAGNOSIS — G808 Other cerebral palsy: Secondary | ICD-10-CM

## 2011-06-30 DIAGNOSIS — J189 Pneumonia, unspecified organism: Secondary | ICD-10-CM

## 2011-06-30 DIAGNOSIS — G40909 Epilepsy, unspecified, not intractable, without status epilepticus: Secondary | ICD-10-CM | POA: Diagnosis present

## 2011-06-30 DIAGNOSIS — R569 Unspecified convulsions: Secondary | ICD-10-CM | POA: Diagnosis present

## 2011-06-30 DIAGNOSIS — F79 Unspecified intellectual disabilities: Secondary | ICD-10-CM | POA: Diagnosis present

## 2011-06-30 DIAGNOSIS — T17908A Unspecified foreign body in respiratory tract, part unspecified causing other injury, initial encounter: Secondary | ICD-10-CM

## 2011-06-30 DIAGNOSIS — L8991 Pressure ulcer of unspecified site, stage 1: Secondary | ICD-10-CM | POA: Diagnosis present

## 2011-06-30 DIAGNOSIS — J9801 Acute bronchospasm: Secondary | ICD-10-CM

## 2011-06-30 DIAGNOSIS — J151 Pneumonia due to Pseudomonas: Principal | ICD-10-CM | POA: Diagnosis present

## 2011-06-30 DIAGNOSIS — L89109 Pressure ulcer of unspecified part of back, unspecified stage: Secondary | ICD-10-CM | POA: Diagnosis present

## 2011-06-30 DIAGNOSIS — J9620 Acute and chronic respiratory failure, unspecified whether with hypoxia or hypercapnia: Secondary | ICD-10-CM | POA: Diagnosis present

## 2011-06-30 DIAGNOSIS — R0600 Dyspnea, unspecified: Secondary | ICD-10-CM

## 2011-06-30 DIAGNOSIS — J9600 Acute respiratory failure, unspecified whether with hypoxia or hypercapnia: Secondary | ICD-10-CM

## 2011-06-30 DIAGNOSIS — J45909 Unspecified asthma, uncomplicated: Secondary | ICD-10-CM | POA: Diagnosis present

## 2011-06-30 HISTORY — DX: Myoneural disorder, unspecified: G70.9

## 2011-06-30 HISTORY — DX: Pneumonia, unspecified organism: J18.9

## 2011-06-30 LAB — BASIC METABOLIC PANEL
BUN: 6 mg/dL (ref 6–23)
CO2: 29 mEq/L (ref 19–32)
Calcium: 9.8 mg/dL (ref 8.4–10.5)
Chloride: 96 mEq/L (ref 96–112)
Creatinine, Ser: 0.23 mg/dL — ABNORMAL LOW (ref 0.50–1.35)
Glucose, Bld: 86 mg/dL (ref 70–99)

## 2011-06-30 LAB — CBC
HCT: 47.5 % (ref 39.0–52.0)
Hemoglobin: 16.7 g/dL (ref 13.0–17.0)
Hemoglobin: 14.2 g/dL (ref 13.0–17.0)
MCH: 32.1 pg (ref 26.0–34.0)
MCV: 91.5 fL (ref 78.0–100.0)
MCV: 91.2 fL (ref 78.0–100.0)
Platelets: 155 10*3/uL (ref 150–400)
RBC: 4.42 MIL/uL (ref 4.22–5.81)
RDW: 13.7 % (ref 11.5–15.5)
WBC: 9.4 10*3/uL (ref 4.0–10.5)
WBC: 10.6 10*3/uL — ABNORMAL HIGH (ref 4.0–10.5)

## 2011-06-30 LAB — DIFFERENTIAL
Basophils Absolute: 0 10*3/uL (ref 0.0–0.1)
Eosinophils Relative: 3 % (ref 0–5)
Lymphocytes Relative: 28 % (ref 12–46)
Monocytes Absolute: 1.2 10*3/uL — ABNORMAL HIGH (ref 0.1–1.0)
Monocytes Relative: 12 % (ref 3–12)
Neutro Abs: 5.3 10*3/uL (ref 1.7–7.7)

## 2011-06-30 LAB — CREATININE, SERUM: GFR calc Af Amer: >90 mL/min (ref >90)

## 2011-06-30 MED ORDER — GABAPENTIN 300 MG PO CAPS
300.0000 mg | ORAL_CAPSULE | Freq: Three times a day (TID) | ORAL | Status: DC
  Administered 2011-06-30 – 2011-07-08 (×23): 300 mg
  Filled 2011-06-30 (×26): qty 1

## 2011-06-30 MED ORDER — IPRATROPIUM BROMIDE 0.02 % IN SOLN
0.5000 mg | Freq: Once | RESPIRATORY_TRACT | Status: AC
  Administered 2011-06-30: 0.5 mg via RESPIRATORY_TRACT
  Filled 2011-06-30: qty 2.5

## 2011-06-30 MED ORDER — ALBUTEROL SULFATE (5 MG/ML) 0.5% IN NEBU
2.5000 mg | INHALATION_SOLUTION | Freq: Four times a day (QID) | RESPIRATORY_TRACT | Status: DC
  Administered 2011-06-30 – 2011-07-01 (×2): 2.5 mg via RESPIRATORY_TRACT
  Filled 2011-06-30 (×3): qty 0.5

## 2011-06-30 MED ORDER — DIVALPROEX SODIUM 125 MG PO CPSP
250.0000 mg | ORAL_CAPSULE | Freq: Every day | ORAL | Status: DC
  Administered 2011-07-01 – 2011-07-07 (×7): 250 mg via ORAL
  Filled 2011-06-30 (×8): qty 2

## 2011-06-30 MED ORDER — PANTOPRAZOLE SODIUM 40 MG PO PACK
40.0000 mg | PACK | Freq: Every day | ORAL | Status: DC
  Administered 2011-06-30 – 2011-07-07 (×8): 40 mg
  Filled 2011-06-30 (×12): qty 20

## 2011-06-30 MED ORDER — PANTOPRAZOLE SODIUM 20 MG PO TBEC
20.0000 mg | DELAYED_RELEASE_TABLET | Freq: Every day | ORAL | Status: DC
  Filled 2011-06-30: qty 1

## 2011-06-30 MED ORDER — SODIUM CHLORIDE 0.9 % IV SOLN
250.0000 mL | INTRAVENOUS | Status: DC | PRN
  Administered 2011-07-05: 250 mL via INTRAVENOUS

## 2011-06-30 MED ORDER — DEXTROSE 5 % IV SOLN
1.0000 g | INTRAVENOUS | Status: DC
  Administered 2011-07-01 – 2011-07-02 (×2): 1 g via INTRAVENOUS
  Filled 2011-06-30 (×2): qty 10

## 2011-06-30 MED ORDER — DEXTROSE 5 % IV SOLN
500.0000 mg | INTRAVENOUS | Status: DC
  Administered 2011-07-01 – 2011-07-02 (×2): 500 mg via INTRAVENOUS
  Filled 2011-06-30 (×2): qty 500

## 2011-06-30 MED ORDER — SODIUM CHLORIDE 0.9 % IJ SOLN
3.0000 mL | Freq: Two times a day (BID) | INTRAMUSCULAR | Status: DC
  Administered 2011-06-30 – 2011-07-08 (×16): 3 mL via INTRAVENOUS

## 2011-06-30 MED ORDER — ALBUTEROL SULFATE (5 MG/ML) 0.5% IN NEBU
2.5000 mg | INHALATION_SOLUTION | RESPIRATORY_TRACT | Status: DC | PRN
  Administered 2011-07-01: 2.5 mg via RESPIRATORY_TRACT
  Filled 2011-06-30: qty 0.5

## 2011-06-30 MED ORDER — BACLOFEN 20 MG PO TABS
20.0000 mg | ORAL_TABLET | Freq: Three times a day (TID) | ORAL | Status: DC
  Administered 2011-06-30 – 2011-07-08 (×23): 20 mg via ORAL
  Filled 2011-06-30 (×25): qty 1

## 2011-06-30 MED ORDER — ACETAMINOPHEN 650 MG RE SUPP: 650.0000 mg | Freq: Four times a day (QID) | RECTAL | Status: DC | PRN

## 2011-06-30 MED ORDER — DIVALPROEX SODIUM 125 MG PO CPSP
375.0000 mg | ORAL_CAPSULE | Freq: Two times a day (BID) | ORAL | Status: DC
Start: 2011-06-30 — End: 2011-07-08
  Administered 2011-06-30 – 2011-07-08 (×16): 375 mg via ORAL
  Filled 2011-06-30 (×18): qty 3

## 2011-06-30 MED ORDER — GLYCOPYRROLATE 1 MG PO TABS
1.0000 mg | ORAL_TABLET | Freq: Three times a day (TID) | ORAL | Status: DC
  Administered 2011-06-30 – 2011-07-08 (×23): 1 mg via ORAL
  Filled 2011-06-30 (×26): qty 1

## 2011-06-30 MED ORDER — BIOTENE DRY MOUTH MT LIQD
15.0000 mL | Freq: Two times a day (BID) | OROMUCOSAL | Status: DC
  Administered 2011-06-30 – 2011-07-02 (×4): 15 mL via OROMUCOSAL

## 2011-06-30 MED ORDER — ALBUTEROL SULFATE (5 MG/ML) 0.5% IN NEBU
2.5000 mg | INHALATION_SOLUTION | Freq: Once | RESPIRATORY_TRACT | Status: AC
  Administered 2011-06-30: 2.5 mg via RESPIRATORY_TRACT
  Filled 2011-06-30: qty 1

## 2011-06-30 MED ORDER — ENOXAPARIN SODIUM 40 MG/0.4ML ~~LOC~~ SOLN
40.0000 mg | SUBCUTANEOUS | Status: DC
  Administered 2011-06-30 – 2011-07-03 (×4): 40 mg via SUBCUTANEOUS
  Filled 2011-06-30 (×5): qty 0.4

## 2011-06-30 MED ORDER — ACETAMINOPHEN 325 MG PO TABS: 650.0000 mg | ORAL_TABLET | Freq: Four times a day (QID) | ORAL | Status: DC | PRN

## 2011-06-30 MED ORDER — ALBUTEROL SULFATE (5 MG/ML) 0.5% IN NEBU
2.5000 mg | INHALATION_SOLUTION | Freq: Once | RESPIRATORY_TRACT | Status: AC
  Administered 2011-06-30: 2.5 mg via RESPIRATORY_TRACT
  Filled 2011-06-30: qty 0.5

## 2011-06-30 MED ORDER — OSMOLITE 1.2 CAL PO LIQD
1000.0000 mL | ORAL | Status: DC
  Administered 2011-06-30 – 2011-07-07 (×4): 1000 mL
  Filled 2011-06-30 (×11): qty 1000

## 2011-06-30 MED ORDER — ACETAMINOPHEN 650 MG RE SUPP
RECTAL | Status: AC
  Administered 2011-06-30: 13:00:00
  Filled 2011-06-30: qty 1

## 2011-06-30 MED ORDER — SODIUM CHLORIDE 0.9 % IJ SOLN: 3.0000 mL | INTRAMUSCULAR | Status: DC | PRN

## 2011-06-30 MED ORDER — AZITHROMYCIN 500 MG IV SOLR
500.0000 mg | Freq: Once | INTRAVENOUS | Status: AC
  Administered 2011-06-30: 500 mg via INTRAVENOUS
  Filled 2011-06-30: qty 500

## 2011-06-30 MED ORDER — DEXTROSE 5 % IV SOLN
1.0000 g | Freq: Once | INTRAVENOUS | Status: AC
  Administered 2011-06-30: 13:00:00 via INTRAVENOUS
  Filled 2011-06-30: qty 10

## 2011-06-30 NOTE — H&P (Signed)
[name] is an 21 y.o. male.   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  When EMS arrived, his pulse ox was in the 50's and per mom's report, the teacher stated he looked purple.  He was placed on a non-rebreather and brought to the ED, where his breathing became more comfortable after a breathing treatment.  While in the ED, he became febrile to 101 and tachycardic, with his heart rate in the 150's.  Per mom, he had a non-productive cough for a few hours last night, but has not had any congestion, increased secretions, or fevers.  No GI symptoms, last BM over the weekend.  No sick contacts at home.  He was awake this morning when she got him ready for school.  He is not currently as interactive as normal.  He has had sudden respiratory distress before, often due to aspiration vs mucus plugging.   While in the ED, he received 3 albuterol/atrovent treatments, 1g CTX, 500mg  azithromycin.  Past Medical History  Diagnosis Date  . Allergy   . Asthma   . Cerebral palsy, quadriplegic   . Congenital CMV infection   . Pneumonia   . Seizures     No past surgical history on file.  Family History  Problem Relation Age of Onset  . Allergies Mother   . Heart disease Maternal Uncle   . Colon cancer Paternal Grandmother    Social History:  reports that he has never smoked. He does not have any smokeless tobacco history on file. He reports that he does not drink alcohol or use illicit drugs.  Allergies:  Allergies  Allergen Reactions  . Sulfamethoxazole W-Trimethoprim      (Not in a hospital admission)  Results for orders placed during the hospital encounter of 06/30/11 (from the past 48 hour(s))  CBC     Status: Abnormal   Collection Time   06/30/11 10:10 AM      Component Value Range Comment   WBC 9.4  4.0 - 10.5 (K/uL)    RBC  5.19  4.22 - 5.81 (MIL/uL)    Hemoglobin 16.7  13.0 - 17.0 (g/dL)    HCT 16.1  09.6 - 04.5 (%)    MCV 91.5  78.0 - 100.0 (fL)    MCH 32.2  26.0 - 34.0 (pg)    MCHC 35.2  30.0 - 36.0 (g/dL)    RDW 40.9  81.1 - 91.4 (%)    Platelets 145 (*) 150 - 400 (K/uL)   DIFFERENTIAL     Status: Abnormal   Collection Time   06/30/11 10:10 AM      Component Value Range Comment   Neutrophils Relative 57  43 - 77 (%)    Neutro Abs 5.3  1.7 - 7.7 (K/uL)    Lymphocytes Relative 28  12 - 46 (%)    Lymphs Abs 2.6  0.7 - 4.0 (K/uL)    Monocytes Relative 12  3 - 12 (%)    Monocytes Absolute 1.2 (*) 0.1 - 1.0 (K/uL)    Eosinophils Relative 3  0 - 5 (%)    Eosinophils Absolute 0.3  0.0 - 0.7 (K/uL)    Basophils Relative 0  0 - 1 (%)    Basophils Absolute 0.0  0.0 - 0.1 (K/uL)   BASIC METABOLIC PANEL     Status:  Abnormal   Collection Time   06/30/11 10:10 AM      Component Value Range Comment   Sodium 135  135 - 145 (mEq/L)    Potassium 3.9  3.5 - 5.1 (mEq/L)    Chloride 96  96 - 112 (mEq/L)    CO2 29  19 - 32 (mEq/L)    Glucose, Bld 86  70 - 99 (mg/dL)    BUN 6  6 - 23 (mg/dL)    Creatinine, Ser 1.61 (*) 0.50 - 1.35 (mg/dL)    Calcium 9.8  8.4 - 10.5 (mg/dL)    GFR calc non Af Amer >90  >90 (mL/min)    GFR calc Af Amer >90  >90 (mL/min)    Dg Chest Portable 1 View  06/30/2011  *RADIOLOGY REPORT*  Clinical Data: Respiratory distress  PORTABLE CHEST - 1 VIEW  Comparison: 12/06/2010  Findings: Cardiomediastinal silhouette is stable.  Spinal fixation rods thoracolumbar spine again noted.  No pulmonary edema.  There is subtle hazy left basilar atelectasis or infiltrate.  IMPRESSION: Subtle hazy left basilar atelectasis or infiltrate.  No pulmonary edema.  Spinal fixation rods thoracolumbar spine again noted.  Original Report Authenticated By: Natasha Mead, M.D.    Review of Systems  Constitutional: Positive for fever.  HENT: Negative for congestion and ear discharge.   Eyes: Negative for discharge.    Respiratory: Positive for cough. Negative for sputum production, shortness of breath, wheezing and stridor.   Gastrointestinal: Negative for vomiting, diarrhea, constipation and blood in stool.  Musculoskeletal: Negative for falls.  Skin: Positive for rash.    Blood pressure 89/40, pulse 133, temperature 100.7 F (38.2 C), temperature source Rectal, resp. rate 35, SpO2 93.00%. Physical Exam  Constitutional: No distress.  HENT:  Head: Normocephalic and atraumatic.  Right Ear: External ear normal.  Left Ear: External ear normal.  Nose: Nose normal.  Mouth/Throat: No oropharyngeal exudate.  Eyes: Conjunctivae are normal. Pupils are equal, round, and reactive to light.  Neck: Neck supple. No thyromegaly present.  Cardiovascular: Normal rate and regular rhythm.   No murmur heard. Respiratory: Effort normal. No accessory muscle usage. Not tachypneic. No respiratory distress. He has wheezes. He has no rales.  GI: Soft. Bowel sounds are normal. He exhibits no distension.  Musculoskeletal:       No contractures, extremities c/w CP  Lymphadenopathy:    He has no cervical adenopathy.  Skin: Skin is warm and dry. He is not diaphoretic. No cyanosis.       Small, raised petechiae over bilateral upper extremities     Assessment/Plan 21 yo M with CP/MR and h/o multiple pneumonias p/w sudden respiratory distress and likely PNA on CXR  1. Respiratory distress: much improved s/p breathing treatments, likely to be PNA based on CXR findings- CAP vs aspiration also likely with component of mucus plugging -given CTX and azithro in the ED, will continue abx -Tylenol PRN fevers -aggressive NT suctioning by RT -sputum culture -chest physiotherapy -albuterol q6/q2 PRN  2. Seizure d/o: usually has small, daily seizures but not grand mal -continue home dosing of depakote sprinkles (375mg  at 6am, 250mg  at 2pm, 375mg  at 9pm)  3. 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 Sleep apnea: history of -per mom, does not use CPAP machine at home  5. FEN/GI: consult to nutrition to continue home J-tube feeds (continuous), SLIV  6. Ppx: Lovenox, protonix (on prevacid at home) -will recheck platelets in AM (145 on 06/30/11)  7. Dispo: admit to SDU given respiratory difficulties, d/c pending clinical improvement    Rosan Calbert 06/30/2011, 4:17 PM

## 2011-06-30 NOTE — ED Notes (Signed)
RT will be meeting pt. At bedside on 2602, to given Breathing treatment,  Pt. s' mother requested that pt. Will need a treatment shortly .  Sats 96%  Skin is p/w/d

## 2011-06-30 NOTE — ED Notes (Signed)
Reported to Dr. Preston Fleeting, pt.s elevated HR,  And temperature of 100.7

## 2011-06-30 NOTE — ED Notes (Signed)
Resp tech to administer breathing treatment

## 2011-06-30 NOTE — ED Notes (Signed)
Pt. Is stable, mother at bedside, admitting MD at bedside.

## 2011-06-30 NOTE — ED Notes (Signed)
Mother at bedside.

## 2011-06-30 NOTE — ED Notes (Signed)
Reported pt.s temperature to Dr. Preston Fleeting and pt. Medicated per Md orders

## 2011-06-30 NOTE — ED Notes (Signed)
Pt.s parents flushed G-tube at the bedside with sterile water

## 2011-06-30 NOTE — ED Notes (Signed)
Pt. Was going to [employer], and his teacher noted that he was cyanotic when he got off the bus.  When paramedics arrived, [facility] was there and had pt. On a 15l non-rebreather.  sats was 96%. CBg 106.   Upon arrival pt. Is having difficulty breathing with accessory muscles in use.

## 2011-06-30 NOTE — ED Provider Notes (Signed)
History     CSN: [id number]  Arrival date & time 06/30/11  4098   First MD Initiated Contact with Patient 06/30/11 [phone]      Chief Complaint  Patient presents with  . Respiratory Distress    (Consider location/radiation/quality/duration/timing/severity/associated sxs/prior treatment) The history is provided by a parent and a caregiver. The history is limited by a developmental delay.   21 year old male was reported to be in his normal state when he left home to go to school. On arrival at school, he was noted to be in respiratory distress. Oxygen saturation was reported to be 51%. He was placed on oxygen and saturations only came up to 66% and he was transported to the emergency department. Mother states that he will have episodes like this occasionally and she usually treats them with an albuterol nebulizer treatment. He has not had a fever that anyone was aware of, nor has he had chills or sweats.  Past Medical History  Diagnosis Date  . Allergy   . Asthma   . Cerebral palsy, quadriplegic   . Congenital CMV infection   . Pneumonia   . Seizures     No past surgical history on file.  Family History  Problem Relation Age of Onset  . Allergies Mother   . Heart disease Maternal Uncle   . Colon cancer Paternal Grandmother     History  Substance Use Topics  . Smoking status: Never Smoker   . Smokeless tobacco: Not on file  . Alcohol Use: No      Review of Systems  Unable to perform ROS: Other    Allergies  Sulfamethoxazole w-trimethoprim  Home Medications   Current Outpatient Rx  Name Route Sig Dispense Refill  . ALBUTEROL SULFATE HFA 108 (90 BASE) MCG/ACT IN AERS Inhalation Inhale 2 puffs into the lungs every 4 (four) hours as needed.      . ALBUTEROL SULFATE (2.5 MG/3ML) 0.083% IN NEBU  USE AS DIRECTED IN NEBULIZER 300 mL 0  . BACLOFEN 10 MG PO TABS Per Tube Place 2 tablets (20 mg total) into feeding tube 3 (three) times daily. 2 tabs per tube three times a day  180 each 11  . DIAZEPAM 2.5 MG RE GEL  Unknown dose. Prescribed by Dr. Sharene Skeans, neurology     . DIVALPROEX SODIUM 125 MG PO CPSP  3 caps per tube at 6 am, 2 caps per tube at 2 pm, 3 caps per tube at 9 pm     . GABAPENTIN 300 MG PO CAPS  1 tab per tube three times a day     . GLYCOPYRROLATE 1 MG PO TABS  1 TAB PER TUBE THREE TIMES A DAY FOR LUNG SECRETIONS 90 tablet 11  . LANSOPRAZOLE 30 MG PO CPDR  1 tab per tube two times a day     . POLYETHYLENE GLYCOL 3350 PO PACK  Take 1 capful in 8 ozs water as needed  Once daily to twice daily for constipation 100 each 3    BP 139/93  Pulse 112  Temp(Src) 97.7 F (36.5 C) (Axillary)  Resp 31  SpO2 97%  Physical Exam  Nursing note and vitals reviewed.  21 year old male in moderate respiratory distress. Vital signs are significant for tachycardia with heart rate 112, and tachypnea with respiratory rate of 31, and borderline hypertension with blood pressure 139/93. Oxygen saturation is 97% which is normal-however, this is with supplemental oxygen via a nonrebreather mask. Head is normocephalic and atraumatic. PERRLA. He does  not cooperate for EOM exam. Oropharynx is grossly normal. Neck is nontender and supple. Back is nontender. Lungs decreased airflow with prolonged exhalation phase. There are no overt rales, wheezes, or rhonchi. Heart has a regular rate and rhythm which is tachycardic. No murmurs are heard. He is noted to be using accessory muscles of respiration. Abdomen is soft, flat, nontender without masses or hepatosplenomegaly. Extremities are atrophic without cyanosis or edema. Neurologic: He is awake and alert but does not respond to voice or follow commands. Cranial nerves are grossly intact. 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  ED Course  Procedures (including critical care time)   Labs Reviewed  CBC  DIFFERENTIAL  BASIC METABOLIC PANEL   No results found.   Date: 06/30/2011  Rate: 122  Rhythm: sinus tachycardia   QRS Axis: right  Intervals: normal  ST/T Wave abnormalities: normal  Conduction Disutrbances:Incomplete right bundle-branch block  Narrative Interpretation: Sinus tachycardia with left atrial hypertrophy and right axis deviation and incomplete right bundle-branch block. Significant baseline artifact present. No old ECG available for comparison.  Old EKG Reviewed: none available  1200: Reexamination shows he is still tachypneic but no longer using accessory muscles of respiration. Lungs have mild wheezing. Heart rate continues to be 115-120. He will be given another albuterol with Atrovent nebulizer treatment. Chest x-ray shows questionable pneumonia. He will be started on Rocephin and Zithromax. His mother states that he had been on oxygen at home a long time ago but has not needed it recently. He will need to be reassessed following treatment. He may need to be admitted for respiratory support as well as IV antibiotics.  1330: Reexamination shows tachycardia is increased to 152 beats per minute. Lung exam still shows scattered wheezes but some upper airway on inspiratory rhonchi are also present. He only given a third albuterol nebulizer treatment but he appears to unstable to go home. Consultation will be obtained with the family practice Center.  Case has been discussed with resident on call for family practice Center and arrangements are made to admit the patient to step down unit.  1. Community acquired pneumonia   2. Bronchospasm   3. Dyspnea    CRITICAL CARE Performed by: Dione Booze   Total critical care time: 55 minutes  Critical care time was exclusive of separately billable procedures and treating other patients.  Critical care was necessary to treat or prevent imminent or life-threatening deterioration.  Critical care was time spent personally by me on the following activities: development of treatment plan with patient and/or surrogate as well as nursing, discussions with  consultants, evaluation of patient's response to treatment, examination of patient, obtaining history from patient or surrogate, ordering and performing treatments and interventions, ordering and review of laboratory studies, ordering and review of radiographic studies, pulse oximetry and re-evaluation of patient's condition.     MDM  Acute respiratory distress with bronchospasm. Chest x-ray will be obtained and will be given an albuterol nebulizer treatment and reassessed. Old records are reviewed and he has had episodes of dyspnea for which she was seen in his physician's office.        Dione Booze, MD 06/30/11 1538

## 2011-07-01 ENCOUNTER — Inpatient Hospital Stay (HOSPITAL_COMMUNITY): Payer: BC Managed Care – PPO

## 2011-07-01 DIAGNOSIS — T17908A Unspecified foreign body in respiratory tract, part unspecified causing other injury, initial encounter: Secondary | ICD-10-CM

## 2011-07-01 DIAGNOSIS — Z8701 Personal history of pneumonia (recurrent): Secondary | ICD-10-CM

## 2011-07-01 DIAGNOSIS — G809 Cerebral palsy, unspecified: Secondary | ICD-10-CM

## 2011-07-01 LAB — BASIC METABOLIC PANEL
BUN: 6 mg/dL (ref 6–23)
Chloride: 105 mEq/L (ref 96–112)
Creatinine, Ser: <0.2 mg/dL — ABNORMAL LOW (ref 0.50–1.35)
Glucose, Bld: 92 mg/dL (ref 70–99)
Potassium: 4 mEq/L (ref 3.5–5.1)
Sodium: 140 mEq/L (ref 135–145)

## 2011-07-01 LAB — VALPROIC ACID LEVEL: Valproic Acid Lvl: 68.4 ug/mL (ref 50.0–100.0)

## 2011-07-01 LAB — CBC
HCT: 41.1 % (ref 39.0–52.0)
Hemoglobin: 13.8 g/dL (ref 13.0–17.0)
MCH: 31.1 pg (ref 26.0–34.0)
MCHC: 33.6 g/dL (ref 30.0–36.0)
MCV: 92.6 fL (ref 78.0–100.0)
RBC: 4.44 MIL/uL (ref 4.22–5.81)
RDW: 14 % (ref 11.5–15.5)

## 2011-07-01 MED ORDER — IPRATROPIUM BROMIDE 0.02 % IN SOLN
RESPIRATORY_TRACT | Status: AC
  Administered 2011-07-01: 0.5 mg
  Filled 2011-07-01: qty 2.5

## 2011-07-01 MED ORDER — FREE WATER
60.0000 mL | Status: DC
  Administered 2011-07-01 – 2011-07-08 (×85): 60 mL

## 2011-07-01 MED ORDER — GERHARDT'S BUTT CREAM
TOPICAL_CREAM | Freq: Two times a day (BID) | CUTANEOUS | Status: DC
  Administered 2011-07-01 – 2011-07-02 (×3): via TOPICAL
  Administered 2011-07-02: 1 via TOPICAL
  Administered 2011-07-03 – 2011-07-08 (×11): via TOPICAL
  Filled 2011-07-01: qty 1

## 2011-07-01 MED ORDER — ALBUTEROL SULFATE (5 MG/ML) 0.5% IN NEBU
2.5000 mg | INHALATION_SOLUTION | RESPIRATORY_TRACT | Status: DC
  Administered 2011-07-01: 2.5 mg via RESPIRATORY_TRACT
  Filled 2011-07-01: qty 0.5

## 2011-07-01 MED ORDER — ALBUTEROL SULFATE (5 MG/ML) 0.5% IN NEBU
2.5000 mg | INHALATION_SOLUTION | Freq: Four times a day (QID) | RESPIRATORY_TRACT | Status: DC
  Administered 2011-07-01 – 2011-07-02 (×4): 2.5 mg via RESPIRATORY_TRACT
  Filled 2011-07-01 (×3): qty 0.5

## 2011-07-01 MED ORDER — ALBUTEROL SULFATE (5 MG/ML) 0.5% IN NEBU: 2.5000 mg | INHALATION_SOLUTION | RESPIRATORY_TRACT | Status: DC | PRN

## 2011-07-01 MED ORDER — IPRATROPIUM BROMIDE 0.02 % IN SOLN
0.5000 mg | RESPIRATORY_TRACT | Status: DC
  Administered 2011-07-01: 0.5 mg via RESPIRATORY_TRACT
  Filled 2011-07-01: qty 2.5

## 2011-07-01 MED ORDER — ARTIFICIAL TEARS OP OINT
TOPICAL_OINTMENT | OPHTHALMIC | Status: DC | PRN
  Administered 2011-07-01: 11:00:00 via OPHTHALMIC
  Filled 2011-07-01: qty 3.5

## 2011-07-01 NOTE — Progress Notes (Signed)
Pt. Had a seizure as reported by his mom; unwitnessed.

## 2011-07-01 NOTE — Progress Notes (Signed)
Family Medicine Teaching Service Daily Resident Note   Patient name: [name] Medical record number: [id number] Date of birth: 12-30-1990 Age: 21 y.o. Gender: male Length of Stay:  LOS: 1 day             SUBJECTIVE & OVERNIGHT EVENTS  Mother at bedside.  States she feels he is working slightly hard to breath than normal.  No overnight events.  Would like to have tube feed flushes q2 hours.              OBJECTIVE  Vitals: Patient Vitals for the past 24 hrs:  BP Temp Temp src Pulse Resp SpO2 Height Weight  07/01/11 0731 108/61 mmHg 97.3 F (36.3 C) Axillary 106  22  96 % - -  07/01/11 0400 94/60 mmHg 97.3 F (36.3 C) Axillary 112  19  100 % - -  07/01/11 0237 - - - - - 98 % - -  07/01/11 0000 97/58 mmHg 98.2 F (36.8 C) Axillary 113  18  99 % - 99 lb 10.4 oz (45.2 kg)  06/30/11 2001 96/50 mmHg 97.4 F (36.3 C) Axillary 104  24  97 % - -  06/30/11 1956 - - - - - 95 % - -  06/30/11 1900 109/69 mmHg 98.5 F (36.9 C) Axillary 114  23  96 % 4\' 10"  (1.473 m) 93 lb 11.1 oz (42.5 kg)  06/30/11 1502 - 100.7 F (38.2 C) Rectal - - - - -  06/30/11 0936 139/93 mmHg 97.7 F (36.5 C) Axillary 112  31  98 % - -   Wt Readings from Last 3 Encounters:  07/01/11 99 lb 10.4 oz (45.2 kg)    Intake/Output Summary (Last 24 hours) at 07/01/11 0904 Last data filed at 07/01/11 0600  Gross per 24 hour  Intake    211 ml  Output      0 ml  Net    211 ml    PE: GENERAL:  Young Adult MR/CP male.  Examined in [facility] 2600.  Lying in bed   moderate respiratory distress.   PSYCH: Not interactive or attentive to conversation; no eye contact H&N: AT/[place], MMM, no scleral icterus, EOMi NECK: spastic flexion contracture with loss of cervical lordosis; R functional torticollis EYES: L eye with conjunctival irritation and abrasion over L lateral angle of eye THORAX: HEART: RRR, S1/S2 heard, no murmur LUNGS: Increased work of breathing including supraclavicular retractions; poor air movement diffusely with  end expiratory wheezes in Posterior Lung fields;  ABDOMEN:  GTube in place and running; no abdominal distention; no rigidity EXTREMITIES: spastic contractures with post surgical changes evident GU: Male condom cath in place  LABS:  Lab 07/01/11 0705 06/30/11 2021 06/30/11 1010  WBC 9.2 10.6* 9.4  HGB 13.8 14.2 16.7  HCT 41.1 40.3 47.5  PLT 149* 155 145*    Lab 07/01/11 0705 06/30/11 2021 06/30/11 1010  NA 140 -- 135  K 4.0 -- 3.9  CL 105 -- 96  CO2 30 -- 29  BUN 6 -- 6  CREATININE <0.20* 0.24* 0.23*  CALCIUM 9.4 -- 9.8  GLUCOSE 92 -- 86   Hematology:  Lab 06/30/11 1010  LYMPHSABS 2.6  MONOABS 1.2*  EOSABS 0.3  BASOSABS 0.0    Lab 07/01/11 0705 06/30/11 2021 06/30/11 1010  RDW 14.0 13.6 13.7  MCV 92.6 91.2 91.5  MCHC 33.6 35.2 35.2  MRET -- -- --   IMAGING: (in last 48 Hours) Dg Chest Portable 1 View  06/30/2011  *RADIOLOGY REPORT*  Clinical Data: Respiratory distress  PORTABLE CHEST - 1 VIEW  Comparison: 12/06/2010  Findings: Cardiomediastinal silhouette is stable.  Spinal fixation rods thoracolumbar spine again noted.  No pulmonary edema.  There is subtle hazy left basilar atelectasis or infiltrate.  IMPRESSION: Subtle hazy left basilar atelectasis or infiltrate.  No pulmonary edema.  Spinal fixation rods thoracolumbar spine again noted.  Original Report Authenticated By: Natasha Mead, M.D.   MEDS: I have reviewed all medications and made changes as below.            ASSESSMENT & PLAN  Hospital Day #1 21 yo M with CP/MR and h/o of multiple pneumonias p/w sudden respiratory distress and likely PNA on CXR  1. RESP - (Respiratory Distress, CAP, Sleep Apnea) - Pt hypoxia reported to the 50s on RA prior to arrival to ED.  Improved with O2, and albuterol treatment.  History of recurrent PNA with prior mucous plugging due to underlying CP.  Pt does NOT use CPAP at home *CTX 5/6 > *Azithro 5/6 > *Albuterol q4o+q2 *Ipatropium q4o - RT therapy including: Chest PT; aggressive  NT suctioning; vibratory vest   - Consult to Pulmonology (Dr. Delton Coombes Primary Pulmonologist) for assistance with respiratory toilet - Trach discussed at last office visit   2. NEURO - (Seizure Disorder, MR/CP) - Typically has small, daily seizures but no generalized/grand mal; continued on home regimen while acutely hospitalized *Depakote Sprinkles *Gabapentin *Baclofen *Robinol - Depakote Regimen = 375mg  @ 6AM & 9PM + 250mg @2PM   - Consider holding Robinol if cough is suppressed   3. SKIN - (Pressure Ulcer R Sacrum ~1cm, L Eye Abrasion) *Lacrilube - Eye with small abrasion on Lateral Fold - no evidence of infection  - Continue Frequent turning  --- FEN (J-Tube Feeds) *SL - Continue home J-Tube feeds - Nutrition consulted with q2o flushes per request --- PPx: Lovenox; PPI            DISPOSITION  Will continue CAP + Aspiration coverage ABX; consult Pulmonology   Andrena Mews, DO [facility] Resident - PGY-1 07/01/2011 9:04 AM

## 2011-07-01 NOTE — H&P (Signed)
I have seen and examined this patient. I have discussed with Dr Fara Boros.  I agree with their findings and plans as documented in their admission note.  Acute Issues 1. 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 - Recent increase in wet-sounding cough, fever, acute oxygen desaturation with possible left lower lobe infiltrate on a poor quality CHEST XRAY (1V) - Differential Diagnosis for acute respiratory failure: Mucus plugging/aspiration of oral secretions, bronchospasm, and pneumonia. _ Recommendations - Agree with CAP/Aspiration pneumonia coverage - Albuterol treatment - Pulmonary toilet with Vibratory vest and  Chest percussion. Consult Dr Delton Coombes for further recommendations for pulmonary toileting for patient.  Of note, Dr Delton Coombes mentioned tracheostomy on his last office consultation with the patient/parents.   2. Pressure Ulcer, first degree - flushing skin right sacral area, ~ 1 cm diameter. No skin break. _ Recommendation: Frequent turning and protective barrier to area.

## 2011-07-01 NOTE — Consult Note (Signed)
Name: [name] MRN: [id number] DOB: Apr 19, 1990    LOS: 1  History of Present Illness:  21 year old male who is fully dependant for all ADLs w/ PMH of CP, poorly controlled seizure disorder, sleep apnea and prior episodes of hypoxia due to mucous plugging and aspiration. Presented acutely on 5/6 after arriving at school on bus hypoxic and cyanotic. Pulm asked to evaluate for pulmonary hygiene and risk/benefit discussion with parents about trach/life support.     Cultures: Sputum 5/6: few gpc in clusters/pairs>>>  Antibiotics: Rocephin 5/6 (CAP)>>> azithro 5/6 (CAP)>>>  Tests / Events: 5/7 seizure lasting ~1 minute. A little labored post-ictal. Settled down. This is a daily occurrence per d/w his mother [name].   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 When EMS arrived, he was reported to be hypoxic and cyanotic w/ room air pulse ox in 50% range. He was placed on a non-rebreather and brought to the ED, where his breathing became more comfortable after a breathing treatment. While in the ED, he became febrile to 101 and tachycardic, with his heart rate in the 150's. Per mom, he had a non-productive cough for a few hours the prior evening, but has not had any congestion, increased secretions, or fevers.  No sick contacts at home. His admitting PCXR was unremarkable w/ only slight haziness in bases. Per records he has had sudden respiratory distress before, often due to aspiration vs mucus plugging. Pulm asked to see for recommendations for pulmonary toilet and question about possibility for trach.    Past Medical History  Diagnosis Date  . Allergy   . Asthma   . Cerebral palsy, quadriplegic   . Congenital CMV infection   . Pneumonia   . Seizures: poorly controlled, occur daily.   . Neuromuscular disorder     Has not required intubation  as of yet.     Sleep apnea: intolerant of CPAP d/t secretions               Past Surgical History  Procedure Date  . Nissen fundoplication   . Rod    Prior to Admission medications   Medication Sig Start Date End Date Taking? Authorizing Provider  albuterol (PROAIR HFA) 108 (90 BASE) MCG/ACT inhaler Inhale 2 puffs into the lungs every 4 (four) hours as needed. Shortness of breath   Yes Historical Provider, MD  albuterol (PROVENTIL) (2.5 MG/3ML) 0.083% nebulizer solution Take 2.5 mg by nebulization every 6 (six) hours as needed. Shortness of breath   Yes Historical Provider, MD  baclofen (LIORESAL) 10 MG tablet Take 20 mg by mouth 3 (three) times daily.   Yes Historical Provider, MD  divalproex (DEPAKOTE SPRINKLE) 125 MG capsule 3 caps per tube at 6 am, 2 caps per tube at 2 pm, 3 caps per tube at 9 pm    Yes Historical Provider, MD  gabapentin (NEURONTIN) 300 MG capsule 1 tab per tube three times a day    Yes Historical Provider, MD  glycopyrrolate (ROBINUL) 1 MG tablet Take 1 mg by mouth 3 (three) times daily.   Yes Historical Provider, MD  lansoprazole (PREVACID) 30 MG capsule Take 30 mg by mouth daily.    Yes Historical Provider, MD  diazepam (DIASTAT) 2.5 MG GEL Unknown dose. Prescribed by Dr. Sharene Skeans, neurology     Historical  Provider, MD    Allergies Allergies  Allergen Reactions  . Sulfamethoxazole W-Trimethoprim     Family History Family History  Problem Relation Age of Onset  . Allergies Mother   . Heart disease Maternal Uncle   . Colon cancer Paternal Grandmother     Social History  reports that he has never smoked. He has never used smokeless tobacco. He reports that he does not drink alcohol or use illicit drugs.Requires total care. Immobile. Communicated w/ non-verbal moans and facial grimaces.   Review Of Systems unable  Vital Signs: BP 108/61  Pulse 106  Temp(Src) 97.3 F (36.3 C) (Axillary)  Resp 22  Ht 4\' 10"  (1.473 m)  Wt 45.2 kg (99 lb 10.4 oz)   BMI 20.83 kg/m2  SpO2 96%      . feeding supplement (OSMOLITE 1.2 CAL) 1,000 mL (06/30/11 2338)    Intake/Output Summary (Last 24 hours) at 07/01/11 1010 Last data filed at 07/01/11 0600  Gross per 24 hour  Intake    211 ml  Output      0 ml  Net    211 ml    Physical Examination: General: 21 year old male currently w/ labored rhonchorous respirations and loud upper airway noises. Now s/p seizure which was witnessed.  Neuro: opens eyes, grunts, non-verbal. Drifting off and on to sleep  HEENT:  PERRL, pink conjunctivae, moist membranes,  Neck:  Supple, no JVD   Cardiovascular:  RRR, no M/R/G Lungs:  Diffuse rhonchi, episodes of apnea Abdomen:  Soft, nontender, nondistended, bowel sounds present, PEG in place.  Musculoskeletal: all extremities contracted, no pedal edema Skin:  No rash  Ventilator settings: Vent Mode:  [-]  FiO2 (%):  [40 %-50 %] 40 %  Labs and Imaging:   Lab 07/01/11 0705 06/30/11 2021 06/30/11 1010  NA 140 -- 135  K 4.0 -- 3.9  CL 105 -- 96  CO2 30 -- 29  BUN 6 -- 6  CREATININE <0.20* 0.24* 0.23*  GLUCOSE 92 -- 86    Lab 07/01/11 0705 06/30/11 2021 06/30/11 1010  HGB 13.8 14.2 16.7  HCT 41.1 40.3 47.5  WBC 9.2 10.6* 9.4  PLT 149* 155 145*    ASSESSMENT AND PLAN Acute Respiratory Failure. Would favor aspiration event after un-witnessed seizure over CAP given no significant prodrome. Possible transient mucous plug but if that were the case he should not still be hypoxic. Agree with current antibiotic selection and ordered pulmonary hygiene measures. He is NOT a candidate for NIPPV given his inability to effectively clear secretions. Spoke at length with his mother [name] who specifically wanted to discuss risks/benefits of life support machine (ventilator) and tracheostomy. She verbalized that she does not think that this is the route that they would want to take should the time come, but wanted to discuss this further with her husband. We discussed the  benefits of tracheostomy: airway clearance, treatment of sleep apnea, but also discussed that this would not prevent aspiration. We also discussed treatment with limitations: treat aggressively w/ pulmonary hygiene, antibiotics, oxygen and hydration, but should he deteriorate in spite of these measures we could transition to comfort focused care. She (the mother) would prefer this option but wants to discuss this further w/ her husband.  Recommendations -continue current antibiotics and pulmonary hygiene measures -continue to wean FIO2 -f/u CXR -we will be available for further discussion re: trach/vent. Suspect that family will decide on limitations of care which we would support.    Anders Simmonds  ACNP-BC [facility] Pulmonary/Critical Care Pager # [phone] OR # [phone] if no answer   PCCM Attending: Seen and discussed with ACNP above.  Pt examined and database reviewed. I agree with above findings, assessment and plan as reflected in the note above. To reiterate the most salient points above, a tracheostomy tube would facilitate our ability to suction and remove secretions but will not completely prevent aspiration pneumonia. He has a strong spontaneous cough (I witnessed this) and therefore should be capable of mobilizing secretions on his own for the most part. On the other hand, a tracheostomy tube would lead to a very small decrement in QOL from his current status though it might impair his already limited capacity to communicate as it will all but eliminate any capacity for phonation of any sort. I have not had the opportunity to speak with pt's mother personally but ACNP Tanja Port has communicated these concepts to his mother.  There is no role for ipratropium bromide in this setting and theoretically might increase viscosity of his respiratory secretions thus making them more difficult to expectorate. I have stopped this medication. Although I find no evidence of bronchospasm, albuterol might  facilitate mucociliary function and clearance of secretions. Cont this (I have adjusted dosing schedule)   Billy Fischer, MD;  PCCM service; Mobile [phone]

## 2011-07-01 NOTE — Progress Notes (Addendum)
Noted  Stiffening of upper extremities and head going back. Mom reports that was a seizure and she admits to him having them daily. Anders Simmonds, [place] pulmonology aware on rounds.

## 2011-07-01 NOTE — Progress Notes (Signed)
I have seen and examined this patient. I have discussed with Dr Berline Chough.  I agree with their findings and plans as documented in their progress note for today.  See my H&P note for details.

## 2011-07-01 NOTE — Progress Notes (Signed)
INITIAL ADULT NUTRITION ASSESSMENT Date: 07/01/2011   Time: 8:51 AM  Reason for Assessment: MD Consult for TF Initiation/Management, [place], Home TF  ASSESSMENT: Male 21 y.o.  Dx: Community acquired pneumonia  Hx:  Past Medical History  Diagnosis Date  . Allergy   . Asthma   . Cerebral palsy, quadriplegic   . Congenital CMV infection   . Pneumonia   . Seizures   . Neuromuscular disorder     cp   Past Surgical History  Procedure Date  . Nissen fundoplication   . Rod    Related Meds:     . acetaminophen      . albuterol  2.5 mg Nebulization Once  . albuterol  2.5 mg Nebulization Once  . albuterol  2.5 mg Nebulization Once  . albuterol  2.5 mg Nebulization Q6H  . antiseptic oral rinse  15 mL Mouth Rinse BID  . azithromycin (ZITHROMAX) 500 MG IVPB  500 mg Intravenous Once  . azithromycin  500 mg Intravenous Q24H  . baclofen  20 mg Oral TID  . cefTRIAXone (ROCEPHIN)  IV  1 g Intravenous Once  . cefTRIAXone (ROCEPHIN)  IV  1 g Intravenous Q24H  . divalproex  250 mg Oral Q1400  . divalproex  375 mg Oral BID  . enoxaparin  40 mg Subcutaneous Q24H  . gabapentin  300 mg Per Tube TID  . Gerhardt's butt cream   Topical BID  . glycopyrrolate  1 mg Oral TID  . ipratropium  0.5 mg Nebulization Once  . ipratropium  0.5 mg Nebulization Once  . ipratropium  0.5 mg Nebulization Once  . pantoprazole sodium  40 mg Per Tube QHS  . sodium chloride  3 mL Intravenous Q12H  . DISCONTD: pantoprazole  20 mg Oral Q1200   Ht: 4\' 10"  (147.3 cm)  Wt: 99 lb 10.4 oz (45.2 kg)  Ideal Wt: 39.6 kg (adjusted for quadriplegia) % Ideal Wt: 114%  Usual Wt: 99 lb (per mom) % Usual Wt: 100%  Body mass index is 20.83 kg/(m^2). Weight is WNL.  Food/Nutrition Related Hx: j-tube for nutrition  Labs:  CMP     Component Value Date/Time   NA 140 07/01/2011 0705   K 4.0 07/01/2011 0705   CL 105 07/01/2011 0705   CO2 30 07/01/2011 0705   GLUCOSE 92 07/01/2011 0705   BUN 6 07/01/2011 0705   CREATININE <0.20* 07/01/2011 0705   CALCIUM 9.4 07/01/2011 0705   GFRNONAA NOT CALCULATED 07/01/2011 0705   GFRAA NOT CALCULATED 07/01/2011 0705    Intake/Output Summary (Last 24 hours) at 07/01/11 0852 Last data filed at 07/01/11 0600  Gross per 24 hour  Intake    211 ml  Output      0 ml  Net    211 ml   Diet Order:  NPO  Supplements/Tube Feeding: Osmolite 1.2 at 37 ml/hr, 2 oz flushes every 2-4 hr  IVF:    feeding supplement (OSMOLITE 1.2 CAL) Last Rate: 1,000 mL (06/30/11 2338)    Estimated Nutritional Needs:   Kcal: 1000 -1200 kcal Protein:  36 - 45 grams Fluid:  1.2 - 1.4 L/d  Admitted with acute respiratory distress.  Past medical history of CP/MR.  Uses j-tube for feedings at baseline.  RD consulted for adjustment of TF regimen. Current order is Osmolite 1.2 at 37 ml/hr with 60 ml flushes q 2-4 hours. This provides: 1065 kcal, 50 grams protein, 728 ml free water, 360 - 720 ml additional water daily. Mom  reports that pt receives 60 ml water flushes q 2 hours at baseline and would like for him to continue that regimen here. Discussed this with RN.   Obvious muscle wasting present, however head/temples all well-nourished with no signs of wasting. Mom reports wt has been stable.  NUTRITION DIAGNOSIS: -Inadequate oral intake (NI-2.1).  Status: Ongoing  RELATED TO: inability to eat  AS EVIDENCE BY: NPO status, need for j-tube for alternate nutrition  MONITORING/EVALUATION(Goals): Goal: EN to meet >90% of estimated needs. Monitor: TF tolerance, weights, labs  EDUCATION NEEDS: -No education needs identified at this time  INTERVENTION: 1. Continue home regimen of Osmolite 1.2 at 37 ml/hr with 60 ml (2 oz) flushes q 2 hours. This provides: 1065 kcal, 50 grams protein, 728 ml free water, 720 ml additional water daily (total of 1448 ml water daily.) 2. RD to continue to follow nutrition care plan  Dietitian #: [phone]  DOCUMENTATION CODES Per approved criteria  -Not  Applicable    Adair Laundry 07/01/2011, 8:51 AM

## 2011-07-02 DIAGNOSIS — R0609 Other forms of dyspnea: Secondary | ICD-10-CM

## 2011-07-02 LAB — CBC
HCT: 39.5 % (ref 39.0–52.0)
MCV: 92.3 fL (ref 78.0–100.0)
RBC: 4.28 MIL/uL (ref 4.22–5.81)
WBC: 5.3 10*3/uL (ref 4.0–10.5)

## 2011-07-02 LAB — BASIC METABOLIC PANEL
BUN: 5 mg/dL — ABNORMAL LOW (ref 6–23)
CO2: 28 mEq/L (ref 19–32)
Chloride: 101 mEq/L (ref 96–112)
Creatinine, Ser: <0.2 mg/dL — ABNORMAL LOW (ref 0.50–1.35)
Glucose, Bld: 127 mg/dL — ABNORMAL HIGH (ref 70–99)

## 2011-07-02 MED ORDER — ALBUTEROL SULFATE (5 MG/ML) 0.5% IN NEBU: 2.5000 mg | INHALATION_SOLUTION | Freq: Four times a day (QID) | RESPIRATORY_TRACT | Status: DC | PRN

## 2011-07-02 MED ORDER — AMOXICILLIN-POT CLAVULANATE 600-42.9 MG/5ML PO SUSR: 800.0000 mg | Freq: Two times a day (BID) | ORAL | Status: DC

## 2011-07-02 MED ORDER — CHLORHEXIDINE GLUCONATE 0.12 % MT SOLN
15.0000 mL | Freq: Two times a day (BID) | OROMUCOSAL | Status: DC
  Administered 2011-07-02 – 2011-07-08 (×13): 15 mL via OROMUCOSAL
  Filled 2011-07-02 (×16): qty 15

## 2011-07-02 MED ORDER — AMOXICILLIN-POT CLAVULANATE 250-62.5 MG/5ML PO SUSR
500.0000 mg | Freq: Two times a day (BID) | ORAL | Status: DC
  Administered 2011-07-02 – 2011-07-03 (×2): 500 mg
  Filled 2011-07-02 (×3): qty 10

## 2011-07-02 MED ORDER — BIOTENE DRY MOUTH MT LIQD
15.0000 mL | Freq: Two times a day (BID) | OROMUCOSAL | Status: DC
  Administered 2011-07-02 – 2011-07-07 (×12): 15 mL via OROMUCOSAL

## 2011-07-02 MED ORDER — ALBUTEROL SULFATE (5 MG/ML) 0.5% IN NEBU
2.5000 mg | INHALATION_SOLUTION | Freq: Three times a day (TID) | RESPIRATORY_TRACT | Status: DC
  Administered 2011-07-02: 2.5 mg via RESPIRATORY_TRACT
  Filled 2011-07-02: qty 0.5

## 2011-07-02 NOTE — Progress Notes (Signed)
Family Medicine Teaching Service Daily Resident Note   Patient name: [name] Medical record number: [id number] Date of birth: 07/08/1990 Age: 21 y.o. Gender: male Length of Stay:  LOS: 2 days             SUBJECTIVE & OVERNIGHT EVENTS  Father at bedside.  States [name] appears to be doing better.  No overnight events.  Feels that he is much improved.  Family leaning away from trach but willing to continue discussion.            OBJECTIVE  Vitals: Patient Vitals for the past 24 hrs:  BP Temp Temp src Pulse Resp SpO2 Weight  07/02/11 0753 107/56 mmHg 98.3 F (36.8 C) Axillary 86  20  99 % -  07/02/11 0400 130/76 mmHg 98 F (36.7 C) Axillary 115  23  93 % -  07/02/11 0000 127/82 mmHg 98.3 F (36.8 C) Axillary 90  17  99 % 96 lb 9 oz (43.8 kg)  07/01/11 2300 122/64 mmHg - - 108  - 97 % -  07/01/11 2000 105/62 mmHg 98.1 F (36.7 C) Axillary 73  14  98 % -  07/01/11 1936 - - - - - 98 % -  07/01/11 1600 - 98 F (36.7 C) Axillary - - - -  07/01/11 1249 105/72 mmHg 98 F (36.7 C) Axillary 103  14  97 % -   Wt Readings from Last 3 Encounters:  07/02/11 96 lb 9 oz (43.8 kg)    Intake/Output Summary (Last 24 hours) at 07/02/11 0819 Last data filed at 07/02/11 0813  Gross per 24 hour  Intake   1517 ml  Output    650 ml  Net    867 ml    PE: GENERAL:  Young Adult MR/CP male.  Examined in [facility] 2600.  Lying in bed with L preference   no respiratory distress.   PSYCH: Not interactive or attentive to conversation; no eye contact H&N: AT/, MMM, no scleral icterus, EOMi NECK: spastic flexion contracture with loss of cervical lordosis; L torticollis EYES: L eye with conjunctival irritation and abrasion over L lateral angle of eye - improved THORAX: HEART: RRR, S1/S2 heard, no murmur LUNGS: Improved air movement; no focal consolidation appreciated but exam limited due to limited mobility ABDOMEN:  GTube in place and running; no abdominal distention; no rigidity EXTREMITIES:  spastic contractures in all extremities  LABS:  Lab 07/02/11 0655 07/01/11 0705 06/30/11 2021 06/30/11 1010  WBC 5.3 9.2 10.6* 9.4  HGB 13.4 13.8 14.2 16.7  HCT 39.5 41.1 40.3 47.5  PLT 139* 149* 155 145*     Lab 07/01/11 0705 06/30/11 2021 06/30/11 1010  NA 140 -- 135  K 4.0 -- 3.9  CL 105 -- 96  CO2 30 -- 29  BUN 6 -- 6  CREATININE <0.20* 0.24* 0.23*  CALCIUM 9.4 -- 9.8  GLUCOSE 92 -- 86    Hematology:  Lab 06/30/11 1010  LYMPHSABS 2.6  MONOABS 1.2*  EOSABS 0.3  BASOSABS 0.0     Lab 07/02/11 0655 07/01/11 0705 06/30/11 2021  RDW 13.9 14.0 13.6  MCV 92.3 92.6 91.2  MCHC 33.9 33.6 35.2  MRET -- -- --     Lab 07/01/11 2007  PHENYTOIN --  PHENOBARB --  VALPROATE 68.4  CBMZ --  LITHIUM --    IMAGING: (in last 48 Hours) Dg Chest Port 1 View  07/01/2011  *RADIOLOGY REPORT*  Clinical Data: Follow up pneumonia  PORTABLE CHEST -  1 VIEW  Comparison: 06/30/2011  Findings: Cardiomediastinal silhouette is stable.  Spinal fixation rods thoracolumbar spine again noted.  No pulmonary edema.  There is  residual left basilar atelectasis or infiltrate with slight improved aeration.  IMPRESSION: Residual left basilar atelectasis or infiltrate with slight improved aeration.  No pulmonary edema.  Original Report Authenticated By: Natasha Mead, M.D.   Dg Chest Portable 1 View  06/30/2011  *RADIOLOGY REPORT*  Clinical Data: Respiratory distress  PORTABLE CHEST - 1 VIEW  Comparison: 12/06/2010  Findings: Cardiomediastinal silhouette is stable.  Spinal fixation rods thoracolumbar spine again noted.  No pulmonary edema.  There is subtle hazy left basilar atelectasis or infiltrate.  IMPRESSION: Subtle hazy left basilar atelectasis or infiltrate.  No pulmonary edema.  Spinal fixation rods thoracolumbar spine again noted.  Original Report Authenticated By: Natasha Mead, M.D.   MEDS: I have reviewed all medications and made changes as below.            ASSESSMENT & PLAN  Hospital Day #2 21 yo  M with CP/MR and h/o of multiple pneumonias p/w sudden respiratory distress and likely PNA on CXR  1. RESP - (Respiratory Distress, CAP, Sleep Apnea) - Pt hypoxia reported to the 50s on RA prior to arrival to ED.  Improved with O2, and albuterol treatment.  History of recurrent PNA with prior mucous plugging due to underlying CP.  Pt does NOT use CPAP at home *Albuterol q3o+q2 *Augmentin  - RT therapy including: Chest PT; aggressive NT suctioning; vibratory vest - 5/8 - O2 Sats improving as is resp distress.  Weaning O2 - Pulm discussion with family regarding Trach: currently family not inclined to pursue this -Augmentin for 10 days total of ABX  [ ]  continue to wean o2.  Has home o2 but doing well and will hopefully  - will transfer to tele floor with o2 monitor - CONSIDER NEB SALINE FOR helping break up secretions.  Albuterol likely helping due to moisture effect as dr. Delton Coombes (per office note) seems to indicate he feels there is less of a bronchospastic component and many of his issues stem from secretions - Consider holding Robinol if cough is suppressed  2. NEURO - (Seizure Disorder, MR/CP) - Typically has small, daily seizures but no generalized/grand mal; continued on home regimen while acutely hospitalized *Depakote Sprinkles *Gabapentin *Baclofen *Robinol - Depakote Regimen = 375mg  @ 6AM & 9PM + 250mg @2PM  - Small daily seizures while hospitalized but baseline in character    3. SKIN - (Pressure Ulcer R Sacrum ~1cm, L Eye Abrasion) *Lacrilube - Eye with small abrasion on Lateral Fold - no evidence of infection  - Continue Frequent turning  --- FEN (J-Tube Feeds) *SL - Continue home J-Tube feeds - Nutrition consulted with q2o flushes per request --- PPx: Lovenox; PPI            DISPOSITION  Will transfer to tele floor.  Continue to monitor O2 but plan to remove.  Pt approaching baseline.  Plan to d/c 5/9 if stable  Andrena Mews, DO [facility] Resident -  PGY-1 07/02/2011 8:19 AM

## 2011-07-02 NOTE — Progress Notes (Addendum)
Order to transfer pt to telemetry unit.  Bed available on 3700.  Pt and family notified of transfer to 3704.  Mother waiting to talk to Dr. Bard Herbert, he is notified and will be here to see family.  Will wait to transfer until seen.  Belongings packed and to be sent with pt.   Report called to [facility] RN on 3700.

## 2011-07-02 NOTE — Progress Notes (Signed)
Name: [name] MRN: [id number] DOB: [birth year]    LOS: 2  History of Present Illness:  21 year old male who is fully dependant for all ADLs w/ PMH of CP, poorly controlled seizure disorder, sleep apnea and prior episodes of hypoxia due to mucous plugging and aspiration. Presented acutely on 5/6 after arriving at school on bus hypoxic and cyanotic. Pulm asked to evaluate for pulmonary hygiene and risk/benefit discussion with parents about trach/life support.     Cultures: Sputum 5/6: few gpc in clusters/pairs>>>  Antibiotics: Rocephin 5/6 (CAP)>>> azithro 5/6 (CAP)>>>  Tests / Events: 5/7 seizure lasting ~1 minute. A little labored post-ictal. Settled down. This is a daily occurrence per d/w his mother [name].   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 When EMS arrived, he was reported to be hypoxic and cyanotic w/ room air pulse ox in 50% range. He was placed on a non-rebreather and brought to the ED, where his breathing became more comfortable after a breathing treatment. While in the ED, he became febrile to 101 and tachycardic, with his heart rate in the 150's. Per mom, he had a non-productive cough for a few hours the prior evening, but has not had any congestion, increased secretions, or fevers.  No sick contacts at home. His admitting PCXR was unremarkable w/ only slight haziness in bases. Per records he has had sudden respiratory distress before, often due to aspiration vs mucus plugging. Pulm asked to see for recommendations for pulmonary toilet and question about possibility for trach.    Past Medical History  Diagnosis Date  . Allergy   . Asthma   . Cerebral palsy, quadriplegic   . Congenital CMV infection   . Pneumonia   . Seizures: poorly controlled, occur daily.   . Neuromuscular disorder     Has not required intubation  as of yet.     Sleep apnea: intolerant of CPAP d/t secretions               Past Surgical History  Procedure Date  . Nissen fundoplication   . Rod    Prior to Admission medications   Medication Sig Start Date End Date Taking? Authorizing Provider  albuterol (PROAIR HFA) 108 (90 BASE) MCG/ACT inhaler Inhale 2 puffs into the lungs every 4 (four) hours as needed. Shortness of breath   Yes Historical Provider, MD  albuterol (PROVENTIL) (2.5 MG/3ML) 0.083% nebulizer solution Take 2.5 mg by nebulization every 6 (six) hours as needed. Shortness of breath   Yes Historical Provider, MD  baclofen (LIORESAL) 10 MG tablet Take 20 mg by mouth 3 (three) times daily.   Yes Historical Provider, MD  divalproex (DEPAKOTE SPRINKLE) 125 MG capsule 3 caps per tube at 6 am, 2 caps per tube at 2 pm, 3 caps per tube at 9 pm    Yes Historical Provider, MD  gabapentin (NEURONTIN) 300 MG capsule 1 tab per tube three times a day    Yes Historical Provider, MD  glycopyrrolate (ROBINUL) 1 MG tablet Take 1 mg by mouth 3 (three) times daily.   Yes Historical Provider, MD  lansoprazole (PREVACID) 30 MG capsule Take 30 mg by mouth daily.    Yes Historical Provider, MD  diazepam (DIASTAT) 2.5 MG GEL Unknown dose. Prescribed by Dr. Sharene Skeans, neurology     Historical  Provider, MD    Allergies Allergies  Allergen Reactions  . Sulfamethoxazole W-Trimethoprim     Family History Family History  Problem Relation Age of Onset  . Allergies Mother   . Heart disease Maternal Uncle   . Colon cancer Paternal Grandmother     Social History  reports that he has never smoked. He has never used smokeless tobacco. He reports that he does not drink alcohol or use illicit drugs.Requires total care. Immobile. Communicated w/ non-verbal moans and facial grimaces.   Review Of Systems unable  Vital Signs: BP 109/66  Pulse 106  Temp(Src) 97.6 F (36.4 C) (Axillary)  Resp 18  Ht 4\' 10"  (1.473 m)  Wt 43.8 kg (96 lb 9 oz)  BMI  20.18 kg/m2  SpO2 94%      . feeding supplement (OSMOLITE 1.2 CAL) 1,000 mL (06/30/11 2338)    Intake/Output Summary (Last 24 hours) at 07/02/11 2030 Last data filed at 07/02/11 1600  Gross per 24 hour  Intake   1793 ml  Output   1250 ml  Net    543 ml    Physical Examination: General: 21 year old male currently w/ labored rhonchorous respirations and loud upper airway noises. Now s/p seizure which was witnessed.  Neuro: opens eyes, grunts, non-verbal. Drifting off and on to sleep  HEENT:  PERRL, pink conjunctivae, moist membranes,  Neck:  Supple, no JVD   Cardiovascular:  RRR, no M/R/G Lungs:  Diffuse rhonchi, episodes of apnea Abdomen:  Soft, nontender, nondistended, bowel sounds present, PEG in place.  Musculoskeletal: all extremities contracted, no pedal edema Skin:  No rash  Ventilator settings: Vent Mode:  [-]  FiO2 (%):  [28 %] 28 %  Labs and Imaging:   Lab 07/02/11 0655 07/01/11 0705 06/30/11 2021 06/30/11 1010  NA 139 140 -- 135  K 4.0 4.0 -- 3.9  CL 101 105 -- 96  CO2 28 30 -- 29  BUN 5* 6 -- 6  CREATININE <0.20* <0.20* 0.24* --  GLUCOSE 127* 92 -- 86    Lab 07/02/11 0655 07/01/11 0705 06/30/11 2021  HGB 13.4 13.8 14.2  HCT 39.5 41.1 40.3  WBC 5.3 9.2 10.6*  PLT 139* 149* 155    ASSESSMENT AND PLAN Acute Respiratory Failure. Would favor aspiration event after un-witnessed seizure over CAP given no significant prodrome. Possible transient mucous plug but if that were the case he should not still be hypoxic. Agree with current antibiotic selection and ordered pulmonary hygiene measures. He is NOT a candidate for NIPPV given his inability to effectively clear secretions. Spoke at length with his mother [name] who specifically wanted to discuss risks/benefits of life support machine (ventilator) and tracheostomy. She verbalized that she does not think that this is the route that they would want to take should the time come, but wanted to discuss this further  with her husband. We discussed the benefits of tracheostomy: airway clearance, treatment of sleep apnea, but also discussed that this would not prevent aspiration. We also discussed treatment with limitations: treat aggressively w/ pulmonary hygiene, antibiotics, oxygen and hydration, but should he deteriorate in spite of these measures we could transition to comfort focused care. She (the mother) would prefer this option but wants to discuss this further w/ her husband.  Recommendations -continue current antibiotics and pulmonary hygiene measures -continue to wean FIO2 -f/u CXR -we will be available for further discussion re: trach/vent. Suspect that family will decide on limitations of care which we would support.  PCCM Attending: I had the opportunity to discuss issues of trach directly with pt's mother sharing with her my perceptions of potential benefits and adverse consequences. Most importantly, I believe a trach tube will impair his already limited ability to communicate by restricting his vocalizations. Also, I expressed concern that the presence of a trach tube might limit the options of care facilities/providers in the future. We agree to forgo this intervention for now. Pt's mother expresses to me that she has discussed vent support and CPR/ACLS matters with her husband. They would not wish to pursue intubation or ACLS under any circumstances.  This needs to be formalized with an Out-of-Hospital DNR form. I will ask care manager to assist in executing this  PCCM will sign off. Please call if we can be of further assistance   Billy Fischer, MD;  PCCM service; Mobile [phone]

## 2011-07-03 ENCOUNTER — Inpatient Hospital Stay (HOSPITAL_COMMUNITY): Payer: BC Managed Care – PPO

## 2011-07-03 DIAGNOSIS — J189 Pneumonia, unspecified organism: Secondary | ICD-10-CM

## 2011-07-03 DIAGNOSIS — J9801 Acute bronchospasm: Secondary | ICD-10-CM

## 2011-07-03 DIAGNOSIS — R05 Cough: Secondary | ICD-10-CM

## 2011-07-03 LAB — BASIC METABOLIC PANEL
CO2: 30 mEq/L (ref 19–32)
Calcium: 9.4 mg/dL (ref 8.4–10.5)
Creatinine, Ser: 0.24 mg/dL — ABNORMAL LOW (ref 0.50–1.35)
Glucose, Bld: 110 mg/dL — ABNORMAL HIGH (ref 70–99)

## 2011-07-03 LAB — CBC
MCH: 31 pg (ref 26.0–34.0)
MCV: 92 fL (ref 78.0–100.0)
Platelets: 137 10*3/uL — ABNORMAL LOW (ref 150–400)
RDW: 13.8 % (ref 11.5–15.5)

## 2011-07-03 MED ORDER — LEVALBUTEROL HCL 0.63 MG/3ML IN NEBU
0.6300 mg | INHALATION_SOLUTION | RESPIRATORY_TRACT | Status: DC
  Administered 2011-07-03 – 2011-07-07 (×23): 0.63 mg via RESPIRATORY_TRACT
  Filled 2011-07-03 (×30): qty 3

## 2011-07-03 MED ORDER — LEVALBUTEROL HCL 0.63 MG/3ML IN NEBU
0.6300 mg | INHALATION_SOLUTION | RESPIRATORY_TRACT | Status: DC | PRN
  Administered 2011-07-03: 0.63 mg via RESPIRATORY_TRACT
  Filled 2011-07-03: qty 3

## 2011-07-03 MED ORDER — ALBUTEROL SULFATE (5 MG/ML) 0.5% IN NEBU
INHALATION_SOLUTION | RESPIRATORY_TRACT | Status: AC
  Filled 2011-07-03: qty 0.5

## 2011-07-03 MED ORDER — LEVALBUTEROL HCL 0.63 MG/3ML IN NEBU
0.6300 mg | INHALATION_SOLUTION | Freq: Four times a day (QID) | RESPIRATORY_TRACT | Status: DC
  Filled 2011-07-03 (×4): qty 3

## 2011-07-03 MED ORDER — DEXTROSE 5 % IV SOLN
2.0000 g | Freq: Three times a day (TID) | INTRAVENOUS | Status: DC
  Administered 2011-07-03 – 2011-07-06 (×9): 2 g via INTRAVENOUS
  Filled 2011-07-03 (×12): qty 2

## 2011-07-03 MED ORDER — CIPROFLOXACIN IN D5W 400 MG/200ML IV SOLN
400.0000 mg | Freq: Two times a day (BID) | INTRAVENOUS | Status: DC
  Administered 2011-07-03 – 2011-07-06 (×6): 400 mg via INTRAVENOUS
  Filled 2011-07-03 (×7): qty 200

## 2011-07-03 MED ORDER — ALBUTEROL SULFATE (5 MG/ML) 0.5% IN NEBU: 2.5000 mg | INHALATION_SOLUTION | Freq: Four times a day (QID) | RESPIRATORY_TRACT | Status: DC

## 2011-07-03 MED ORDER — ALBUTEROL SULFATE (5 MG/ML) 0.5% IN NEBU: 2.5000 mg | INHALATION_SOLUTION | RESPIRATORY_TRACT | Status: DC | PRN

## 2011-07-03 NOTE — Care Management Note (Addendum)
    Page 1 of 1   07/07/2011     4:34:15 PM   CARE MANAGEMENT NOTE 07/07/2011  Patient:  [name], [name]   Account Number:  [id number]  Date Initiated:  07/01/2011  Documentation initiated by:  Ronny Flurry  Subjective/Objective Assessment:   DX: PNA  severe CP/MR     Action/Plan:   lives with mother   Anticipated DC Date:  07/10/2011   Anticipated DC Plan:  HOME W HOME HEALTH SERVICES      DC Planning Services  CM consult      [facility] Choice  Resumption Of Svcs/PTA Provider   Choice offered to / List presented to:  C-6 Parent        HH arranged  HH-1 RN  HH-9 CAPS PROGRAM      HH agency  [facility]   Status of service:  Completed, signed off Medicare Important Message given?   (If response is "NO", the following Medicare IM given date fields will be blank) Date Medicare IM given:   Date Additional Medicare IM given:    Discharge Disposition:  HOME W HOME HEALTH SERVICES  Per UR Regulation:  Reviewed for med. necessity/level of care/duration of stay  If discussed at Long Length of Stay Meetings, dates discussed:    Comments:  07/07/11 11:40 debbie Beyonce Sawatzky rn,bsn [phone] pt has aid but may need hhrn for wound care. await md orders.spoke w mother. pt has eq including oxygen from [facility], fam are caps workers. [facility] come out once q 2 weeks for supplies. md to order hhrn more freq at disch. have alerted [facility] of need for inc visitis and await md orders for this.  07/03/11- 1220- [name] RN, BSN [phone] Referral received for community DNR- spoke with pt's bedside RN- pt is a DNR now here in hospital- MD resident to come see pt- left out of facility (community) DNR with shadow chart for MD to go over with pt's mother and if that is what she wants MD will need to sign form and this form will need to go with pt on discharge to home. NCM to continue to follow for any further needs.

## 2011-07-03 NOTE — Progress Notes (Signed)
Asked by bedside RN to assist with NTS patient. Patient on NRB, increased WOB and audible rhonci, NTS performed with moderate amounts of thick tan secretions removed, Rhonci improved, O2 sats improved, patient tolerated well, NRB still in place, will continue to monitor

## 2011-07-03 NOTE — Progress Notes (Signed)
I discussed with Dr Rigby.  I agree with their plans documented in their progress note. 

## 2011-07-03 NOTE — Significant Event (Addendum)
  Family Medicine Teaching Service ACUTE EVENT   Patient name: [name] Medical record number: [id number] Date of birth: 1990-06-06 Age: 21 y.o. Gender: male Length of Stay:  LOS: 3 days     Called to pt bedside due to worsening hypoxia.  RT and Nursing staff concerned that pt appears to be in more distress.  Pt now on Non-rebreather @50 % and hypoxia improving to 88-91%.  Noted to have apneic spells related to his OSA.  Pt has had intermittent Tachycardia througout his stay with elevations to the 160s.  Currently 130s.  Will adjust respiratory toilet as below.    Lungs have poor air movement and coarse breath sounds throughout.             DISPOSITION  Transfer to SDU for closer monitoring.  Will start Ceftaz 2g q 8o.  Changing Xopenex (on for tachycardia) to q4/q2.  NT suctioning as well as chest PT q4o.    Will repeat portable CXR.  Consider CTA for better evaluation and r/o PE if persistent hypoxia and tachycarida.  No hypotension at this time.    Pt is not a candidate for PPV and is currently DNR/DNI and family does not wish for tracheostomy at this time per conversation with Dr. Sung Amabile last night.  Will alert Pulmonary to pt's change  Andrena Mews, DO [facility] Resident - PGY-1 07/03/2011 1:14 PM

## 2011-07-03 NOTE — Progress Notes (Signed)
Pt O2 sats 85%-3L HR 130-150's.  Heard gurgles and had rhonchi in all lobes. Respiratory called to assist in nasotracheal suctioning-stated "unavaliable". Pt placed on nonrebreather O2 sats increased to low 90's for about and then decreased back to upper 80's-(86-89%), HR sustained 130-140's. Rapid response Nurse came and assisted with suctioning. Pt tolerated well lung sounds much clearer. Pt placed back on nonrebreather and O2 sats 95% HR 110-120's. Pt resting in bed. Will continue to monitor.

## 2011-07-03 NOTE — Progress Notes (Signed)
Name: [name] MRN: [id number] DOB: 1990/07/20    LOS: 3         PCCM   History of Present Illness:  21 year old male who is fully dependant for all ADLs w/ PMH of CP, poorly controlled seizure disorder, sleep apnea and prior episodes of hypoxia due to mucous plugging and aspiration. Presented acutely on 5/6 after arriving at school on bus hypoxic and cyanotic. Pulm asked to evaluate for pulmonary hygiene and risk/benefit discussion with parents about trach/life support.    Cultures: Sputum 5/6: few gpc in clusters/pairs>>>  Antibiotics: Rocephin 5/6 (CAP)>>>5/9 azithro 5/6 (CAP)>>>5/9 Ceftaz (pseudomonas PNA) 5/9>>>  Tests / Events: 5/7 seizure lasting ~1 minute. A little labored post-ictal. Settled down. This is a daily occurrence per d/w his mother [name].  5/9 increased resp distress, hypoxia, tx to SD  Vital Signs: Temp:  [97.6 F (36.4 C)-99.2 F (37.3 C)] 98.3 F (36.8 C) (05/09 1300) Pulse Rate:  [106-132] 132  (05/09 1300) Cardiac Rhythm:  [-] Sinus tachycardia (05/09 0830) Resp:  [18-22] 22  (05/09 1300) BP: (103-120)/(52-67) 120/67 mmHg (05/09 1300) SpO2:  [90 %-100 %] 94 % (05/09 1322) FiO2 (%):  [28 %-50 %] 50 % (05/09 1300)   Intake/Output Summary (Last 24 hours) at 07/03/11 1439 Last data filed at 07/03/11 0900  Gross per 24 hour  Intake    920 ml  Output    900 ml  Net     20 ml     Physical Examination: General: 21 year old male, CP, NAD  Neuro: opens eyes, grunts, non-verbal. Drowsy   HEENT:  PERRL, pink conjunctivae, moist membranes,  Neck:  Supple, no JVD   Cardiovascular:  RRR, no M/R/G Lungs:  Resps even, shallow, Diffuse rhonchi, diminished L Abdomen:  Soft, nontender, nondistended, bowel sounds present, PEG in place.  Musculoskeletal: all extremities contracted, no pedal edema Skin:  No rash   Labs and Imaging:   Lab 07/02/11 0655 07/01/11 0705 06/30/11 2021 06/30/11 1010  NA 139 140 -- 135  K 4.0 4.0 -- 3.9  CL 101  105 -- 96  CO2 28 30 -- 29  BUN 5* 6 -- 6  CREATININE <0.20* <0.20* 0.24* --  GLUCOSE 127* 92 -- 86    Lab 07/02/11 0655 07/01/11 0705 06/30/11 2021  HGB 13.4 13.8 14.2  HCT 39.5 41.1 40.3  WBC 5.3 9.2 10.6*  PLT 139* 149* 155     ASSESSMENT AND PLAN Acute Respiratory Failure. Would favor aspiration event after un-witnessed seizure over CAP given no significant prodrome.  PLAN -  Cont abx - narrowed to ceftaz with pseudomonas, sens pending  He is NOT a candidate for NIPPV given his inability to effectively clear secretions.  Cont aggressive pulm hygiene with chest vest  F/u CXR  O2 - titrate for sats >90% **Have discussed at length with mother today and previously.  Family does not wish to pursue intubation or ACLS in any way nor would they want to pursue tracheostomy.   At this time pt appears comfortable, not in any distress and can potentially get over this acute illness.  Did reiterate today, though, that if he were to deteriorate or develop acute distress we would transition to comfort focused care as has also been previously discussed.  He will need out of facility DNR when he d/c   [facility], NP 07/03/2011  2:47 PM Pager: [phone]  I have fully examined this pt and agree with above.  likley biggest  issue, ATX, mucous plugging. Agree double coverage pseudo, Chest pt,  NO NIMV would harm, mom agrees Re verified NCB. Does not appear to need morphine at this time Updated mom Will spoke RT about upper airway suctioning.  Mcarthur Rossetti. Tyson Alias, MD, FACP Pgr: [phone]  Pulmonary & Critical Care

## 2011-07-03 NOTE — Progress Notes (Signed)
I have seen and examined this patient with Dr Berline Chough.  I agree with his findings and plans as documented in his progress note for today.  Acute Issues  1. Pseudomonal Pneumonia 2. Acute Respiratory Failure on Chronic Respiratory Failure - Secondary to pseudomonal pneumonia with mucus plugging and ongoing aspiration events.   Plan: Double Pseudomonal antibiotic coverage until culture sensitivities are reported. Using [place] and Cipro IV. Pulmonary Hygiene with NT suction, Vibratory Vest, nebulized bronchodilators, chest physical therapy.  Patient's mother does not desire NIPPV nor intubation for mechanical ventilation.  Will continue non-invasive measures with re-consideration of change to comfort care only with changes in patient's status.

## 2011-07-03 NOTE — Progress Notes (Signed)
Family Medicine Teaching Service Daily Resident Note   Patient name: [name] Medical record number: [id number] Date of birth: [birth year] Age: 21 y.o. Gender: male Length of Stay:  LOS: 3 days             SUBJECTIVE & OVERNIGHT EVENTS  Overnight desaturation requiring nasotracheal suctioning and non-rebreather.  Documented in chart but not in VS.  Dr. Sung [name] discussed code status last night with [name]'s mother and family is wishing for DNR status at this time.              OBJECTIVE  Vitals: Patient Vitals for the past 24 hrs:  BP Temp Temp src Pulse Resp SpO2  07/03/11 0500 103/52 mmHg 99.2 F (37.3 C) Axillary 116  20  95 %  07/02/11 2100 116/63 mmHg 97.9 F (36.6 C) Axillary 112  18  100 %  07/02/11 2025 - - - - - 94 %  07/02/11 1704 109/66 mmHg 97.6 F (36.4 C) Axillary 106  18  98 %  07/02/11 1555 105/61 mmHg 98.4 F (36.9 C) Axillary 114  20  97 %  07/02/11 1519 - - - 114  20  96 %  07/02/11 1226 113/76 mmHg 97.3 F (36.3 C) Axillary 116  16  96 %  07/02/11 1000 - - - 111  16  97 %  07/02/11 0930 - - - - - 96 %   Wt Readings from Last 3 Encounters:  07/02/11 96 lb 9 oz (43.8 kg)    Intake/Output Summary (Last 24 hours) at 07/03/11 0928 Last data filed at 07/03/11 0700  Gross per 24 hour  Intake   1525 ml  Output   1400 ml  Net    125 ml    PE: GENERAL:  Young Adult MR/CP male.  Examined in [facility] 2600.  Lying in bed with L preference   no respiratory distress.   PSYCH: Not interactive or attentive to conversation; no eye contact H&N: AT/[place], MMM, no scleral icterus, EOMi NECK: spastic flexion contracture with loss of cervical lordosis; L torticollis EYES: L eye with conjunctival irritation and abrasion over L lateral angle of eye - improved THORAX: LUNGS: PT ON chest PT vest - will revisit later HEART: RRR, S1/S2 heard, no murmur ABDOMEN:  GTube in place and running; no abdominal distention; no rigidity EXTREMITIES: spastic contractures in all  extremities  LABS:  Lab 07/02/11 0655 07/01/11 0705 06/30/11 2021 06/30/11 1010  WBC 5.3 9.2 10.6* 9.4  HGB 13.4 13.8 14.2 16.7  HCT 39.5 41.1 40.3 47.5  PLT 139* 149* 155 145*    Lab 07/02/11 0655 07/01/11 0705 06/30/11 2021 06/30/11 1010  NA 139 140 -- 135  K 4.0 4.0 -- 3.9  CL 101 105 -- 96  CO2 28 30 -- 29  BUN 5* 6 -- 6  CREATININE <0.20* <0.20* 0.24* 0.23*  CALCIUM 9.5 9.4 -- 9.8  GLUCOSE 127* 92 -- 86   Hematology:  Lab 06/30/11 1010  LYMPHSABS 2.6  MONOABS 1.2*  EOSABS 0.3  BASOSABS 0.0    Lab 07/02/11 0655 07/01/11 0705 06/30/11 2021  RDW 13.9 14.0 13.6  MCV 92.3 92.6 91.2  MCHC 33.9 33.6 35.2  MRET -- -- --    Lab 07/01/11 2007  PHENYTOIN --  PHENOBARB --  VALPROATE 68.4  CBMZ --  LITHIUM --    IMAGING: (in last 48 Hours) Dg Chest Port 1 View  07/01/2011  *RADIOLOGY REPORT*  Clinical Data: Follow up pneumonia  PORTABLE  CHEST - 1 VIEW  Comparison: 06/30/2011  Findings: Cardiomediastinal silhouette is stable.  Spinal fixation rods thoracolumbar spine again noted.  No pulmonary edema.  There is  residual left basilar atelectasis or infiltrate with slight improved aeration.  IMPRESSION: Residual left basilar atelectasis or infiltrate with slight improved aeration.  No pulmonary edema.  Original Report Authenticated By: [name], M.D.    MEDS: I have reviewed all medications and made changes as below.            ASSESSMENT & PLAN  Hospital Day #3 21 yo M with CP/MR and h/o of multiple pneumonias p/w sudden respiratory distress and likely PNA on CXR  1. RESP - (Respiratory Distress, CAP, Sleep Apnea) - Pt hypoxia reported to the 50s on RA prior to arrival to ED.  Improved with O2, and albuterol treatment.  History of recurrent PNA with prior mucous plugging due to underlying CP.  Pt does NOT use CPAP at home *Xopenex q4o+q2 *Cefotaz 2g q8o-CHEST PT - RT therapy including: Chest PT; aggressive NT suctioning; vibratory vest - 5/8 - O2 Sats improving as  is resp distress.  Weaning O2 - Pulm Consult for discussion with family regarding Trach: currently family not inclined to pursue this and wish for DNR status at this time - Case Management Consulted - Transition to antipseudomonal po abx (Cipro )pending sensitivies for 21 day course - 5/9 AM - Acute hypoxic episode requiring NT Suctioning via Rapid response team; albuterol tx had been spaced to q8o.   -   < > RESP CULTURE - few pseudomonas - sensitivities pending [ ]  continue to wean o2.  Has home o2 but doing well and will hopefully not require at d/c [ ]  Will change Albuterol to Xopenex and chest PT to q4o scheduled.  NOT APPROPRIATE FOR ADJUSTMENT - CONSIDER NEB SALINE FOR helping break up secretions.  Albuterol likely helping due to moisture effect as dr. Delton [name] (per office note) seems to indicate he feels there is less of a bronchospastic component and many of his issues stem from secretions - Consider holding Robinol if cough is suppressed 2. CV: (Tachycarida) - Pt with baseline HR of 90-110s.  Elevated to 130s currently with events to 160s.   [facility] Course     Will continue to monitor                                      Plan / Followup  If no improvement or worsening BP will sent for CTA chest to evaluated for PE.  Has been on ppx Lovenox since admission.    3. NEURO - (Seizure Disorder, MR/CP) - Typically has small, daily seizures but no generalized/grand mal; continued on home regimen while acutely hospitalized *Depakote Sprinkles *Gabapentin *Baclofen *Robinol - Depakote Regimen = 375mg  @ 6AM & 9PM + 250mg @2PM  - Small daily seizures while hospitalized but baseline in character  - stable at baseline  4. SKIN - (Pressure Ulcer R Sacrum ~1cm, L Eye Abrasion) *Lacrilube - Eye with small abrasion on Lateral Fold - no evidence of infection  - Continue Frequent turning  --- FEN (J-Tube Feeds) *SL - Continue home J-Tube feeds - Nutrition consulted with q2o flushes  per request --- PPx: Lovenox; PPI            DISPOSITION  Pt overall improved.  Likely episode of mucous plugging given thick secretions.  Will re-schedule pulm  toilet.  Will observe 1 additional day due to overnight desaturations.  Parents have suctioning and home and feel they will be able to provide him adequate care but agree with additional observation due to increased frequency of respiratory distress.     Andrena Mews, DO [facility] Resident - PGY-1 07/03/2011 9:28 AM

## 2011-07-04 LAB — CULTURE, RESPIRATORY W GRAM STAIN

## 2011-07-04 MED ORDER — ENOXAPARIN SODIUM 30 MG/0.3ML ~~LOC~~ SOLN
30.0000 mg | SUBCUTANEOUS | Status: DC
  Administered 2011-07-04 – 2011-07-07 (×4): 30 mg via SUBCUTANEOUS
  Filled 2011-07-04 (×5): qty 0.3

## 2011-07-04 NOTE — Progress Notes (Signed)
Clinical Social Worker received call from [employer] regarding advance directive document. CSW and CM met with patient's guardian, [name] at beside. [name] stated that she will bring in her guardian paperwork tomorrow, to put in patient's file. CSW provided patient with the MOST form to be filled out with the physician.   Rozetta Nunnery MSW, [employer] [phone]

## 2011-07-04 NOTE — Progress Notes (Signed)
Family Medicine Teaching Service Daily Resident Note   Patient name: [name] Medical record number: [id number] Date of birth: 1990/12/11 Age: 21 y.o. Gender: male Length of Stay:  LOS: 4 days             SUBJECTIVE & OVERNIGHT EVENTS  No acute hypoxic events overnight, however on 50% ventimask.            OBJECTIVE  Vitals: Patient Vitals for the past 24 hrs:  BP Temp Temp src Pulse Resp SpO2 Weight  07/04/11 1157 - 97.1 F (36.2 C) Axillary 113  12  100 % -  07/04/11 1151 - - - - - 100 % -  07/04/11 0900 117/86 mmHg - - 101  13  99 % -  07/04/11 0800 115/88 mmHg - - 101  17  98 % -  07/04/11 0700 102/72 mmHg 97.4 F (36.3 C) Axillary 97  20  97 % -  07/04/11 0600 116/93 mmHg - - 100  16  99 % -  07/04/11 0500 114/84 mmHg - - 102  19  98 % -  07/04/11 0400 100/62 mmHg - - 88  17  97 % 93 lb 0.6 oz (42.2 kg)  07/04/11 0319 - - - - - 95 % -  07/04/11 0300 106/60 mmHg 97.8 F (36.6 C) Axillary 97  15  98 % -  07/04/11 0200 96/50 mmHg - - 95  20  95 % -  07/04/11 0100 110/58 mmHg - - 105  16  95 % -  07/04/11 0000 128/78 mmHg - - 109  17  100 % -  07/03/11 2334 - - - - - 100 % -  07/03/11 2327 - 97.4 F (36.3 C) Axillary - - - -  07/03/11 2300 123/82 mmHg - - 110  21  99 % -  07/03/11 2200 124/88 mmHg - - 106  19  100 % -  07/03/11 2100 114/88 mmHg - - 105  16  100 % -  07/03/11 2017 - - - - - 100 % -  07/03/11 2000 109/66 mmHg - - 98  18  99 % -  07/03/11 1902 - 98.3 F (36.8 C) Axillary - - - -  07/03/11 1900 93/56 mmHg - - 96  24  93 % -  07/03/11 1800 128/79 mmHg - - 132  27  90 % -  07/03/11 1700 113/74 mmHg - - 107  22  98 % -  07/03/11 1600 125/88 mmHg - - 114  20  98 % -  07/03/11 1549 119/88 mmHg 97.4 F (36.3 C) Axillary 114  22  98 % -  07/03/11 1500 111/68 mmHg - - 95  24  98 % -   Wt Readings from Last 3 Encounters:  07/04/11 93 lb 0.6 oz (42.2 kg)    Intake/Output Summary (Last 24 hours) at 07/04/11 1438 Last data filed at 07/04/11 1301  Gross  per 24 hour  Intake    881 ml  Output   1900 ml  Net  -1019 ml   PE: GENERAL:  Young Adult MR/CP male.  Examined in [facility] 2600.  Lying in bed with L preference   no respiratory distress.   PSYCH: Not interactive or attentive to conversation; no eye contact H&N: AT/[place], MMM, no scleral icterus, EOMi NECK: spastic flexion contracture with loss of cervical lordosis; L torticollis EYES: L eye with conjunctival irritation and abrasion over L lateral angle of eye - improved THORAX:  LUNGS: PT ON chest PT vest - will revisit later HEART: RRR, S1/S2 heard, no murmur ABDOMEN:  GTube in place and running; no abdominal distention; no rigidity EXTREMITIES: spastic contractures in all extremities   LABS:  Lab 07/03/11 1500 07/02/11 0655 07/01/11 0705 06/30/11 2021 06/30/11 1010  WBC 8.3 5.3 9.2 10.6* 9.4  HGB 13.9 13.4 13.8 14.2 16.7  HCT 41.3 39.5 41.1 40.3 47.5  PLT 137* 139* 149* 155 145*    Lab 07/03/11 1500 07/02/11 0655 07/01/11 0705 06/30/11 2021 06/30/11 1010  NA 135 139 140 -- 135  K 3.9 4.0 4.0 -- 3.9  CL 97 101 105 -- 96  CO2 30 28 30  -- 29  BUN 6 5* 6 -- 6  CREATININE 0.24* <0.20* <0.20* 0.24* 0.23*  CALCIUM 9.4 9.5 9.4 -- 9.8  GLUCOSE 110* 127* 92 -- 86    Hematology:  Lab 06/30/11 1010  LYMPHSABS 2.6  MONOABS 1.2*  EOSABS 0.3  BASOSABS 0.0    Lab 07/03/11 1500 07/02/11 0655 07/01/11 0705  RDW 13.8 13.9 14.0  MCV 92.0 92.3 92.6  MCHC 33.7 33.9 33.6  MRET -- -- --    Lab 07/01/11 2007  PHENYTOIN --  PHENOBARB --  VALPROATE 68.4  CBMZ --  LITHIUM --    IMAGING: (in last 48 Hours) Dg Chest Port 1 View  07/03/2011  *RADIOLOGY REPORT*  Clinical Data: Short breath.  Question pneumonia appear  PORTABLE CHEST - 1 VIEW  Comparison: A portable chest 07/01/2011.  Findings: The heart is enlarged.  New left lower lobe airspace disease is concerning for pneumonia.  A left pleural effusion is present as well.  The right lung is clear.  IMPRESSION:  1.  Progressive left  lower lobe airspace disease compatible with pneumonia. 2.  Moderate-sized left pleural effusion.  Original Report Authenticated By: Jamesetta Orleans. MATTERN, M.D.    MEDS: I have reviewed all medications and made changes as below.            ASSESSMENT & PLAN  Hospital Day #4 21 yo M with CP/MR and h/o of multiple pneumonias p/w sudden respiratory distress and likely PNA on CXR  1. RESP - (Respiratory Distress, CAP, Sleep Apnea, PULMONARY EFFUSION) - Pt hypoxia reported to the 50s on RA prior to arrival to ED.  Improved with O2, and albuterol treatment.  History of recurrent PNA with prior mucous plugging due to underlying CP.  Pt does NOT use CPAP at home -  *Xopenex q4o+q2 *Cefotaz 2g q8o *CIPRO-CHEST PT - RT therapy including: Chest PT; aggressive NT suctioning; vibratory vest - 5/8 - O2 Sats improving as is resp distress.  Weaning O2 - Pulm Consult for discussion with family regarding Trach: currently family not inclined to pursue this and wish for DNR status at this time - Case Management Consulted - Transition to antipseudomonal po abx (Cipro )pending sensitivies for 21 day course - 5/9 AM - Acute hypoxic episode requiring NT Suctioning via Rapid response team; albuterol tx had been spaced to q8o.   - 5/9PM - Moderate PULMONARY EFFUSION noted on 1 view Chest X-ray.  - CIPRO & CEFOTAZ for double coverage of Pseudomonas  < > RESP CULTURE - few pseudomonas - sensitivities pending; will narrow ABX when sensitives returned.   [ ]  continue to wean o2.  Has home o2 but doing well and will hopefully not require at d/c  Would appreciate if PULM would address the following: - Moderate Pleural effusion.  ?if diagnostic /therapeutic thoracentesis would  be appropriate given extent and ? If need for prolonged ABX course.  Consider CT chest for better characterizing.  Would appreicate input on duration of ABX now with parapneumonic effusion.  Current plan for 21 days of ABX for pseudomonas.   -  CONSIDER NEB SALINE FOR helping break up secretions.  Albuterol likely helping due to moisture effect as dr. Delton Coombes (per office note) seems to indicate he feels there is less of a bronchospastic component and many of his issues stem from secretions - Consider holding Robinol if cough is suppressed  2. CV: (Tachycarida) - Pt with baseline HR of 90-110s.  Elevated to 130s currently with events to 160s.   [facility] Course     Will continue to monitor  Improved on 5/10                                      Plan / Followup  If no improvement or worsening BP will sent for CTA chest to evaluated for PE.  Has been on ppx Lovenox since admission.    3. NEURO - (Seizure Disorder, MR/CP) - Typically has small, daily seizures but no generalized/grand mal; continued on home regimen while acutely hospitalized *Depakote Sprinkles *Gabapentin *Baclofen *Robinol - Depakote Regimen = 375mg  @ 6AM & 9PM + 250mg @2PM  - Small daily seizures while hospitalized but baseline in character  - stable at baseline  4. SKIN - (Pressure Ulcer R Sacrum ~1cm, L Eye Abrasion) *Lacrilube - Eye with small abrasion on Lateral Fold - no evidence of infection  - Continue Frequent turning  --- FEN (J-Tube Feeds) *SL - Continue home J-Tube feeds - q2o flushes --- PPx: Lovenox; PPI            DISPOSITION  Pt overall improved from yesterday.  Continue scheduled pulmonary toilet and continue to wean O2.  Would appreciate pulmonology's input regarding ABX and further eval of PULM EFFUSION.  Continue to monitor today with anticipated discharge in 2 to 3 days.    Andrena Mews, DO [facility] Resident - PGY-1 07/04/2011 2:38 PM

## 2011-07-05 NOTE — Progress Notes (Signed)
  Name: [name] MRN: [id number] DOB: February 11, 1991    LOS: 5         PCCM   History of Present Illness:  21 year old male who is fully dependant for all ADLs w/ PMH of CP, poorly controlled seizure disorder, sleep apnea and prior episodes of hypoxia due to mucous plugging and aspiration. Presented acutely on 5/6 after arriving at school on bus hypoxic and cyanotic. Pulm asked to evaluate for pulmonary hygiene and risk/benefit discussion with parents about trach/life support.    Cultures: Sputum 5/6: Psuedomonas   Antibiotics: Rocephin 5/6 (CAP)>>>5/9 azithro 5/6 (CAP)>>>5/9 Ceftaz (pseudomonas PNA) 5/9>>> Cipro 5/9 (psuedomonas PNA ) >>  Tests / Events: 5/7 seizure lasting ~1 minute. A little labored post-ictal. Settled down. This is a daily occurrence per d/w his mother [name].  5/9 increased resp distress, hypoxia, tx to SD  Subjective Resting , Dad at bedside , updated   Vital Signs: Temp:  [96.7 F (35.9 C)-97.6 F (36.4 C)] 97.6 F (36.4 C) (05/11 0700) Pulse Rate:  [93-121] 101  (05/11 0900) Cardiac Rhythm:  [-] Sinus tachycardia (05/11 0800) Resp:  [10-23] 19  (05/11 0900) BP: (88-115)/(58-82) 106/68 mmHg (05/11 0900) SpO2:  [87 %-100 %] 92 % (05/11 0900)   Intake/Output Summary (Last 24 hours) at 07/05/11 1102 Last data filed at 07/05/11 0900  Gross per 24 hour  Intake   1651 ml  Output   1000 ml  Net    651 ml     Physical Examination: General: 21 year old male, CP, NAD  Neuro: opens eyes, grunts, non-verbal.  HEENT:  Dry mucosa   Neck:  Supple, no JVD   Cardiovascular:  RRR, no M/R/G Lungs:  Resps even, shallow, few  rhonchi,   Abdomen:  Soft, nontender, nondistended, bowel sounds present, PEG in place.  Musculoskeletal: all extremities contracted, no pedal edema Skin:  No rash   Labs and Imaging:   Lab 07/03/11 1500 07/02/11 0655 07/01/11 0705  NA 135 139 140  K 3.9 4.0 4.0  CL 97 101 105  CO2 30 28 30   BUN 6 5* 6  CREATININE  0.24* <0.20* <0.20*  GLUCOSE 110* 127* 92    Lab 07/03/11 1500 07/02/11 0655 07/01/11 0705  HGB 13.9 13.4 13.8  HCT 41.3 39.5 41.1  WBC 8.3 5.3 9.2  PLT 137* 139* 149*     ASSESSMENT AND PLAN Acute Respiratory Failure. Would favor aspiration event after un-witnessed seizure over CAP given no significant prodrome.  PLAN -  Cont abx -  He is NOT a candidate for NIPPV given his inability to effectively clear secretions.  Cont aggressive pulm hygiene with chest vest  F/u CXR in am  O2 - titrate for sats >90% PCCM to sign off   Previous Family discussion  **Have discussed at length with mother today and previously.  Family does not wish to pursue intubation or ACLS in any way nor would they want to pursue tracheostomy.   At this time pt appears comfortable, not in any distress and can potentially get over this acute illness.  Did reiterate today, though, that if he were to deteriorate or develop acute distress we would transition to comfort focused care as has also been previously discussed.  He will need out of facility DNR when he d/c   PARRETT,TAMMY, NP 07/05/2011  11:02 AM    Independently examined pt, evaluated data & formulated above care plan with NP   [facility] V.

## 2011-07-05 NOTE — Progress Notes (Signed)
Subjective: Interval History: none. Patient remains on supplemental O2 via [place]. No significant desaturations overnight.   Objective: Vital signs in last 24 hours: Temp:  [96.7 F (35.9 C)-97.6 F (36.4 C)] 97.1 F (36.2 C) (05/11 0311) Pulse Rate:  [93-121] 112  (05/11 0600) Resp:  [10-23] 16  (05/11 0600) BP: (88-117)/(58-88) 99/59 mmHg (05/11 0600) SpO2:  [87 %-100 %] 96 % (05/11 0600) FiO2 (%):  [50 %] 50 % (05/10 0814)  Intake/Output from previous day: 05/10 0701 - 05/11 0700 In: 1718 [I.V.:180] Out: 1000 [Urine:1000] Intake/Output this shift: Total I/O In: 1034 [I.V.:180; Other:804; IV Piggyback:50] Out: 400 [Urine:400]  General appearance: asleep, arousable, no acute distress Head: Normocephalic, without obvious abnormality, atraumatic Throat: lips, mucosa, and tongue normal; teeth and gums normal and clear oral secretions  Lungs: clear to auscultation bilaterally Heart: regular rate and rhythm, S1, S2 normal, no murmur, click, rub or gallop Extremities: spastic contractures of all four extremities. No edema. Neurologic: non-verbal, not following commands. Eyes open spontaneously. Moves all four extremities spontaneously.   Labs: no new  Micro:  5/6 Respiratory Cx: Pseudomonas, pan sensitive    Studies/Results: CXR 07/03/11:  Progressive left lower lobe airspace disease compatible with pneumonia. 2.  Moderate-sized left pleural effusion.   CXR 07/01/11:  Residual left basilar atelectasis or infiltrate with slight improved aeration.  No pulmonary edema.   CXR 06/30/11: Subtle hazy left basilar atelectasis or infiltrate.  No pulmonary edema.  Spinal fixation rods thoracolumbar spine again noted  Antibiotics:  Rocephin 5/6 (CAP)>>>5/9  azithro 5/6 (CAP)>>>5/9  Ceftaz (pseudomonas PNA) 5/9>>> Cipro 5/9 (pseudomonas PNA) >>>  Scheduled Meds:   . antiseptic oral rinse  15 mL Mouth Rinse q12n4p  . baclofen  20 mg Oral TID  . cefTAZidime (FORTAZ)  IV  2 g Intravenous Q8H    . chlorhexidine  15 mL Mouth Rinse BID  . ciprofloxacin  400 mg Intravenous Q12H  . divalproex  250 mg Oral Q1400  . divalproex  375 mg Oral BID  . enoxaparin (LOVENOX) injection  30 mg Subcutaneous Q24H  . free water  60 mL Per Tube Q2H  . gabapentin  300 mg Per Tube TID  . Gerhardt's butt cream   Topical BID  . glycopyrrolate  1 mg Oral TID  . levalbuterol  0.63 mg Nebulization Q4H  . pantoprazole sodium  40 mg Per Tube QHS  . sodium chloride  3 mL Intravenous Q12H  . DISCONTD: enoxaparin  40 mg Subcutaneous Q24H   Continuous Infusions:   . feeding supplement (OSMOLITE 1.2 CAL) 1,000 mL (07/04/11 0937)   PRN Meds:sodium chloride, acetaminophen, acetaminophen, artificial tears, levalbuterol  Assessment/Plan: 21 yo fully dependant for all ADLs and past medical history significant for cerebral palsy, epilepsy, sleep apnea and prior history of mucus plugging and presents with following acute episode of hypoxemia.   1. Hypoxemia: Multifactorial from mucus plugging and pseudomonas pneumonia. Improving based on normal WBC, afebrile, and decreased O2 requirement (from venturi mask to 4 L via ). Patient with interval development of L pleural effusion. Patient DNR/DNI.  Plan: -continue to wean O2 as tolerated, may increase O2 to venturi mask but NIMV not recommended as it may cause more harm than good.  -RT to do chest PT  -Continue double coverage for pseudomonas -recheck CBC in AM -repeat PCXR in AM to evaluate L pleural effusion.   2. Seizure disorder: small daily seizures at baseline. No change while in the hospital. Continue regimen of Depakote, gabapentin and Baclofen.  3. Pressure ulcer R sacrum: nurse care with lacrilube and frequent turns.   4. FEN/GI: lytes normal. Tube feeds at goal. Continue.   5. DVT prophylaxis: Lovenox. Plt trending down, but still within normal limits. Will recheck on AM CBC.   6. Dispo: pending continued clinical improvement and patient able to  wean from supplemental O2. Will remain in stepdown as he requires close monitoring of his respiratory status/secretions and a higher level of care.    LOS: 5 days   [facility]

## 2011-07-05 NOTE — Progress Notes (Signed)
I interviewed and examined this patient and discussed the care plan with Dr. Armen Pickup and the [facility] team and agree with assessment and plan as documented in the progress note for today.    Jammy Plotkin A. Sheffield Slider, MD Family Medicine Teaching Service Attending  07/05/2011 1:37 PM

## 2011-07-05 NOTE — Progress Notes (Addendum)
eLink Physician-Brief Progress Note Patient Name: [name] DOB: 1990-06-16 MRN: [id number]  Date of Service  07/05/2011   HPI/Events of Note   RN calling elink:   - Mom "freaking out" after seeing DNR band on patient (mothers day tomorrow). Per RN Mom telling her she only agreed for DNI but not for no CPR. She apparently wants CPR     RN clariied with mom her understanding of conversation with DR Sung Amabile on 07/02/11  About code status (per documentation that day no acls but it appears most of conversation centered around ventilator). Per RN: mom says that she never ever discussed cpr, acls meds with MD and conversation was about resp failure only.   -   eICU Interventions  - advised RN to advise mom  that we are continuing to care for patietn and not abandoning care and band is a warning sign so wishes are respected  - will continue DNI  - but change code status to YES for CPR and pressors (respect mom's wishes)  - Repeat family meeting needed     Intervention Category Intermediate Interventions: Communication with other healthcare providers and/or family  Steffi Noviello 07/05/2011, 5:27 PM

## 2011-07-06 ENCOUNTER — Inpatient Hospital Stay (HOSPITAL_COMMUNITY): Payer: BC Managed Care – PPO

## 2011-07-06 LAB — CBC
Hemoglobin: 15.2 g/dL (ref 13.0–17.0)
MCH: 31.5 pg (ref 26.0–34.0)
Platelets: 191 10*3/uL (ref 150–400)
RBC: 4.83 MIL/uL (ref 4.22–5.81)
WBC: 7.4 10*3/uL (ref 4.0–10.5)

## 2011-07-06 MED ORDER — CIPROFLOXACIN HCL 500 MG PO TABS
500.0000 mg | ORAL_TABLET | Freq: Two times a day (BID) | ORAL | Status: DC
  Administered 2011-07-06 – 2011-07-08 (×4): 500 mg via ORAL
  Filled 2011-07-06 (×6): qty 1

## 2011-07-06 NOTE — Progress Notes (Signed)
PHARMACIST - PHYSICIAN COMMUNICATION  DR: [facility]  CONCERNING: Antibiotic IV to Oral Route Change Policy  RECOMMENDATION:  This patient is receiving Cipro by the intravenous route. Based on criteria approved by the Pharmacy and Therapeutics Committee, the antibiotic(s) is/are being converted to the equivalent oral dose form(s).  DESCRIPTION:  These criteria include:  Patient being treated for a respiratory tract infection, urinary tract infection, or cellulitis  The patient is not neutropenic and does not exhibit a GI malabsorption state  The patient is eating (either orally or via tube) and/or has been taking other orally administered medications for a least 24 hours The patient is improving clinically and has a Tmax < 100.5 If you have questions about this conversion, please contact the Pharmacy Department  [ ]  [phone] ) [facility]  Arly.[place] ] [phone] ) [facility]  [ ]  [phone] ) [facility]  [ ]  [phone] ) Ilene Qua    Janace Litten, PharmD Pager [phone]

## 2011-07-06 NOTE — Progress Notes (Signed)
Subjective: Interval History: none. Patient remains on supplemental O2 via [place]. No significant desaturations overnight.   Objective: Vital signs in last 24 hours: Temp:  [97.4 F (36.3 C)-97.8 F (36.6 C)] 97.7 F (36.5 C) (05/12 0800) Pulse Rate:  [90-137] 137  (05/12 0900) Resp:  [11-23] 22  (05/12 0900) BP: (94-166)/(55-140) 97/55 mmHg (05/12 0900) SpO2:  [89 %-98 %] 96 % (05/12 0900)  Intake/Output from previous day: 05/11 0701 - 05/12 0700 In: 2298 [I.V.:240] Out: 1400 [Urine:1400] Intake/Output this shift: Total I/O In: 107 [I.V.:10; Other:97] Out: -   General appearance: awake and alert, no acute distress Head: Normocephalic, without obvious abnormality, atraumatic Throat: lips, mucosa, and tongue normal; teeth and gums normal and clear oral secretions  Lungs: coarse throughout, has chest PT vest on Heart: regular rate and rhythm, S1, S2 normal, no murmur, click, rub or gallop Extremities: spastic contractures of all four extremities. No edema. Neurologic: non-verbal, not following commands. Eyes open spontaneously. Moves all four extremities spontaneously.   Labs: Lab Results  Component Value Date   WBC 7.4 07/06/2011   HGB 15.2 07/06/2011   HCT 44.9 07/06/2011   MCV 93.0 07/06/2011   PLT 191 07/06/2011     Micro:  5/6 Respiratory Cx: Pseudomonas, pan sensitive    Studies/Results: CXR 5/12:  Reading pending but appears improved  CXR 07/03/11:  Progressive left lower lobe airspace disease compatible with pneumonia. 2.  Moderate-sized left pleural effusion.   CXR 07/01/11:  Residual left basilar atelectasis or infiltrate with slight improved aeration.  No pulmonary edema.   CXR 06/30/11: Subtle hazy left basilar atelectasis or infiltrate.  No pulmonary edema.  Spinal fixation rods thoracolumbar spine again noted  Antibiotics:  Rocephin 5/6 (CAP)>>>5/9  azithro 5/6 (CAP)>>>5/9  Ceftaz (pseudomonas PNA) 5/9>>>5/12 Cipro 5/9 (pseudomonas PNA) >>>  Scheduled Meds:    . antiseptic oral rinse  15 mL Mouth Rinse q12n4p  . baclofen  20 mg Oral TID  . chlorhexidine  15 mL Mouth Rinse BID  . ciprofloxacin  400 mg Intravenous Q12H  . divalproex  250 mg Oral Q1400  . divalproex  375 mg Oral BID  . enoxaparin (LOVENOX) injection  30 mg Subcutaneous Q24H  . free water  60 mL Per Tube Q2H  . gabapentin  300 mg Per Tube TID  . Gerhardt's butt cream   Topical BID  . glycopyrrolate  1 mg Oral TID  . levalbuterol  0.63 mg Nebulization Q4H  . pantoprazole sodium  40 mg Per Tube QHS  . sodium chloride  3 mL Intravenous Q12H  . DISCONTD: cefTAZidime (FORTAZ)  IV  2 g Intravenous Q8H   Continuous Infusions:    . feeding supplement (OSMOLITE 1.2 CAL) 1,000 mL (07/04/11 0937)   PRN Meds:sodium chloride, acetaminophen, acetaminophen, artificial tears, levalbuterol  Assessment/Plan: 21 yo fully dependant for all ADLs and past medical history significant for cerebral palsy, epilepsy, sleep apnea and prior history of mucus plugging and presents with following acute episode of hypoxemia.   1. Hypoxemia: Multifactorial from mucus plugging and pseudomonas pneumonia. Improving based on normal WBC, afebrile, and decreased O2 requirement (from venturi mask to 2 L via [place]). Patient with interval development of L pleural effusion, now appears improved, awaiting read. Patient DNI but not DNR.  Plan: -continue to wean O2 as tolerated, may increase O2 to venturi mask but NIMV not recommended as it may cause more harm than good.  -RT to do chest PT  -Continue cipro, stop ceftaz for pseudomonas -repeat  PCXR improved L pleural effusion.   2. Seizure disorder: small daily seizures at baseline. No change while in the hospital. Continue regimen of Depakote, gabapentin and Baclofen. abx lower seizure threshold, so will monitor closely.  3. Pressure ulcer R sacrum: nurse care with lacrilube and frequent turns.   4. FEN/GI: lytes normal. Tube feeds at goal. Continue.   5. DVT  prophylaxis: Lovenox. Plt returned to normal.  6. Dispo: pending continued clinical improvement and patient able to wean from supplemental O2. Will remain in stepdown as he requires close monitoring of his respiratory status/secretions and a higher level of care.    LOS: 6 days   Koryn Charlot

## 2011-07-06 NOTE — Progress Notes (Signed)
I interviewed and examined this patient and discussed the care plan with Dr. Hulen Luster and the [facility] team and agree with assessment and plan as documented in the progress note for today.    [name] A. Sheffield Slider, MD Family Medicine Teaching Service Attending  07/06/2011 3:27 PM

## 2011-07-07 MED ORDER — CIPROFLOXACIN HCL 500 MG PO TABS: 500.0000 mg | ORAL_TABLET | Freq: Two times a day (BID) | ORAL | Status: AC

## 2011-07-07 MED ORDER — ALBUTEROL SULFATE (5 MG/ML) 0.5% IN NEBU
2.5000 mg | INHALATION_SOLUTION | Freq: Four times a day (QID) | RESPIRATORY_TRACT | Status: DC
  Administered 2011-07-07 – 2011-07-08 (×4): 2.5 mg via RESPIRATORY_TRACT
  Filled 2011-07-07 (×3): qty 0.5

## 2011-07-07 MED ORDER — GERHARDT'S BUTT CREAM: 1.0000 "application " | TOPICAL_CREAM | Freq: Two times a day (BID) | CUTANEOUS | Status: DC

## 2011-07-07 MED ORDER — ARTIFICIAL TEARS OP OINT: TOPICAL_OINTMENT | OPHTHALMIC | Status: DC | PRN

## 2011-07-07 MED ORDER — ALBUTEROL SULFATE (5 MG/ML) 0.5% IN NEBU: 2.5000 mg | INHALATION_SOLUTION | RESPIRATORY_TRACT | Status: DC | PRN

## 2011-07-07 NOTE — Progress Notes (Signed)
FMTS Attending Note Patient seen and examined by me, discussed with resident team.  Damin has been afebrile over 24 hours, with minimal oxygen supplementaton (1L/min intermittently).  Has had improvement in leukocytosis since admission; now on cipro via TF for 24 hrs.  Discussed with both parents at bedside, completed MOST form to indicate parents' wish for full support except intubation/tracheostomy. Paula Compton, MD

## 2011-07-07 NOTE — Progress Notes (Signed)
Family Medicine Teaching Service Daily Resident Note   Patient name: [name] Medical record number: [id number] Date of birth: 11/06/1990 Age: 21 y.o. Gender: male Length of Stay:  LOS: 7 days             SUBJECTIVE & OVERNIGHT EVENTS  [name] did well overnight and is much improved on [place] O2 @ 1L            OBJECTIVE  Vitals: Patient Vitals for the past 24 hrs:  BP Temp Temp src Pulse Resp SpO2  07/07/11 0751 104/69 mmHg 97.2 F (36.2 C) Axillary 127  20  98 %  07/07/11 0600 - - - 95  19  95 %  07/07/11 0530 - - - 97  17  95 %  07/07/11 0500 102/68 mmHg - - 102  17  96 %  07/07/11 0328 - 96.1 F (35.6 C) Axillary - - -  07/07/11 0321 - - - - - 94 %  07/07/11 0300 101/70 mmHg - - 101  20  98 %  07/07/11 0007 - - - - - 97 %  07/07/11 0000 111/86 mmHg - - 121  14  99 %  07/06/11 2315 - 98.5 F (36.9 C) Oral - - -  07/06/11 2025 - - - - - 98 %  07/06/11 2017 - 97.6 F (36.4 C) Axillary - - -  07/06/11 2000 97/57 mmHg - - 99  15  97 %  07/06/11 1900 104/66 mmHg - - 102  16  97 %  07/06/11 1800 98/52 mmHg - - 102  16  96 %  07/06/11 1700 106/69 mmHg - - 108  14  96 %  07/06/11 1600 118/82 mmHg 97.5 F (36.4 C) Oral 136  20  96 %  07/06/11 1543 - - - - - 96 %  07/06/11 1500 111/79 mmHg - - 114  17  97 %  07/06/11 1400 107/70 mmHg - - 106  17  99 %  07/06/11 1300 101/72 mmHg - - 94  15  99 %  07/06/11 1200 72/50 mmHg 97.5 F (36.4 C) Oral 109  11  100 %  07/06/11 1154 - - - - - 99 %  07/06/11 1100 94/69 mmHg - - 100  14  98 %  07/06/11 1000 97/59 mmHg - - 109  11  97 %  07/06/11 0900 97/55 mmHg - - 137  22  96 %   Wt Readings from Last 3 Encounters:  07/04/11 93 lb 0.6 oz (42.2 kg)    Intake/Output Summary (Last 24 hours) at 07/07/11 0818 Last data filed at 07/07/11 0600  Gross per 24 hour  Intake   1674 ml  Output   1000 ml  Net    674 ml    PE: GENERAL:  Young Adult MR/CP male.  Examined in [facility] 2600.  Lying in bed with L preference   no respiratory distress.    PSYCH: Not interactive or attentive to conversation; no eye contact H&N: AT/[place], MMM, no scleral icterus, EOMi NECK: spastic flexion contracture with loss of cervical lordosis; L torticollis EYES: L eye with conjunctival irritation and abrasion over L lateral angle of eye - improved THORAX: LUNGS: PT ON chest PT vest - will revisit later HEART: RRR, S1/S2 heard, no murmur ABDOMEN:  GTube in place and running; no abdominal distention; no rigidity EXTREMITIES: spastic contractures in all extremities  LABS:  Lab 07/06/11 0530 07/03/11 1500 07/02/11 0655 07/01/11 0705 06/30/11 2021  06/30/11 1010  WBC 7.4 8.3 5.3 9.2 10.6* 9.4  HGB 15.2 13.9 13.4 13.8 14.2 16.7  HCT 44.9 41.3 39.5 41.1 40.3 47.5  PLT 191 137* 139* 149* 155 145*    Lab 07/03/11 1500 07/02/11 0655 07/01/11 0705 06/30/11 2021 06/30/11 1010  NA 135 139 140 -- 135  K 3.9 4.0 4.0 -- 3.9  CL 97 101 105 -- 96  CO2 30 28 30  -- 29  BUN 6 5* 6 -- 6  CREATININE 0.24* <0.20* <0.20* 0.24* 0.23*  CALCIUM 9.4 9.5 9.4 -- 9.8  GLUCOSE 110* 127* 92 -- 86    Hematology:  Lab 06/30/11 1010  LYMPHSABS 2.6  MONOABS 1.2*  EOSABS 0.3  BASOSABS 0.0    Lab 07/06/11 0530 07/03/11 1500 07/02/11 0655  RDW 13.7 13.8 13.9  MCV 93.0 92.0 92.3  MCHC 33.9 33.7 33.9  MRET -- -- --    Lab 07/01/11 2007  PHENYTOIN --  PHENOBARB --  VALPROATE 68.4  CBMZ --  LITHIUM --    IMAGING: (in last 48 Hours) Dg Chest Port 1 View  07/06/2011  *RADIOLOGY REPORT*  Clinical Data: Left pleural effusion and pneumonia  PORTABLE CHEST - 1 VIEW  Comparison: Chest radiograph 07/03/2011  Findings: Posterior thoracic fusion noted.  Stable cardiac silhouette.  There is improvement in left lower lobe atelectasis compared to prior.  There remains dense left lower lobe atelectasis.  No pulmonary edema.  No pneumothorax.  IMPRESSION:  1.  Improvement in dense left lower lobe atelectasis.  Original Report Authenticated By: [name], M.D.    MEDS: I  have reviewed all medications and made changes as below.            ASSESSMENT & PLAN  Hospital Day #7 21 yo M with CP/MR and h/o of multiple pneumonias p/w sudden respiratory distress and likely PNA on CXR  1. RESP - (Respiratory Distress, CAP, Sleep Apnea, PULMONARY EFFUSION) - Pt hypoxia reported to the 50s on RA prior to arrival to ED.  Improved with O2, and albuterol treatment.  History of recurrent PNA with prior mucous plugging due to underlying CP.  Pt does NOT use CPAP at home -  *Xopenex q4o+q2 *CIPRO-CHEST PT - RT therapy including: Chest PT; aggressive NT suctioning; vibratory vest - 5/8 - O2 Sats improving as is resp distress.  Weaning O2 - Pulm Consult for discussion with family regarding Trach: currently family not inclined to pursue this and wish for DNR status at this time - Case Management Consulted - Transition to antipseudomonal po abx (Cipro )pending sensitivies for 21 day course - 5/9 AM - Acute hypoxic episode requiring NT Suctioning via Rapid response team; albuterol tx had been spaced to q8o.   - 5/9PM - Moderate PULMONARY EFFUSION noted on 1 view Chest X-ray.  - Continue to follow clinically < > RESP CULTURE - few pseudomonas - FINAL Sensitive to CIPRO - Current plan for 21 days of ABX for pseudomonas.   - CIPRO & CEFOTAZ for double coverage of Pseudomonas > narrowed to CIPRO PO  [ ]  continue to wean o2.  Has home o2 but doing well and will hopefully not require at d/c - Consider holding Robinol if cough is suppressed  2. CV: (Tachycarida) - Pt with baseline HR of 90-110s.  Elevated to 130s currently with events to 160s.   [facility] Course     Will continue to monitor  Improved on 5/10  Plan / Followup  Improved slightly - will transition back to albuterol vs Xopenex as improved and will have albuterol at home    3. 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 SKIN - (Pressure Ulcer R Sacrum ~1cm, L Eye Abrasion) *Lacrilube - Eye with small abrasion on Lateral Fold - no evidence of infection  - Continue Frequent turning  --- FEN (J-Tube Feeds) *SL - Continue home J-Tube feeds - q2o flushes --- PPx: Lovenox; PPI            DISPOSITION  Pt overall improved and close to baseline.  Continues to have intermittent tachycardia and intermittent de-sats associated with sleep apnea spells vs mucous plugging.  Continue to wean O2 (now on RA, but occasional desaturations).  Will ensure home o2 and home health nursing.  Plan for D/c in AM 5/14   Andrena Mews, DO [facility] Resident - PGY-1 07/07/2011 8:20 AM

## 2011-07-08 MED ORDER — GERHARDT'S BUTT CREAM: 1.0000 "application " | TOPICAL_CREAM | Freq: Two times a day (BID) | CUTANEOUS | Status: DC

## 2011-07-08 MED ORDER — IBUPROFEN 100 MG/5ML PO SUSP
400.0000 mg | Freq: Three times a day (TID) | ORAL | Status: DC | PRN
  Administered 2011-07-08: 400 mg via ORAL
  Filled 2011-07-08: qty 20

## 2011-07-08 NOTE — Progress Notes (Signed)
   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   (If response is "NO", the following Medicare IM given date fields will be blank) Date Medicare IM given:   Date Additional Medicare IM given:    Discharge Disposition:  HOME W HOME HEALTH SERVICES  Per UR Regulation:  Reviewed for med. necessity/level of care/duration of stay  If discussed at Long Length of Stay Meetings, dates discussed:    Comments:  07/08/11  11:52a Todd Graceann Boileau rn, bsn spoke w mother. will get [facility] to bring port o2 to room for transport home. ahc to ck concentrator and port tanks at home. asked ahc to provide new tubing. has neb machine and asked for new face mask for neb.faxed orders to [facility] for [facility] for rn and aid. pt's fam are caps aids for pt also.  07/07/11 11:40 Todd Kilyn Maragh rn,bsn [phone] pt has aid but may need hhrn for wound care. await md orders.spoke w mother. pt has eq including oxygen from [facility], fam are caps workers. [facility]se come out once q 2 weeks for supplies. md to order hhrn more freq at disch. have alerted [facility] of need for inc visitis and await md orders for this.  07/03/11- 1220- Todd Pierini RN, BSN [phone] Referral received for community DNR- spoke with pt's bedside RN- pt is a DNR now here in hospital- MD resident to come see pt- left out of facility  (community) DNR with shadow chart for MD to go over with pt's mother and if that is what she wants MD will need to sign form and this form will need to go with pt on discharge to home. NCM to continue to follow for any further needs.

## 2011-07-08 NOTE — Discharge Instructions (Signed)
[facility] [phone] rn, bath aid Caps per family [facility] [phone], ck o2 eq at home, port to room, new face mask for neb machine

## 2011-07-08 NOTE — Discharge Summary (Signed)
Family Medicine Teaching Service DISCHARGE SUMMARY   Patient name: [name] Medical record number: [id number] Date of birth: Nov 26, 1990 Age: 21 y.o. Gender: male  Attending Physician: No att. providers found Primary Care Provider: Ellery Plunk, MD, MD Consultants: pulmonary/intensive care  Dates of Hospitalization:  06/30/2011 to 07/08/2011 Length of Stay: 8 days  Admission Diagnoses:  Hypoxia  Discharge Diagnoses:   No resolved problems to display.  Active Hospital Problems  Diagnoses Date Noted   . Community acquired pneumonia 06/30/2011   . Aspiration Into Respiratory Tract 07/01/2011   . SLEEP APNEA, OBSTRUCTIVE 06/13/2009   . MENTAL RETARDATION 05/30/2009   . CEREBRAL PALSY 05/30/2009   . SEIZURE DISORDER 05/30/2009     Resolved Hospital Problems  Diagnoses Date Noted Date Resolved   Patient Active Problem List  Diagnoses Date Noted  . Aspiration Into Respiratory Tract 07/01/2011  . Community acquired pneumonia 06/30/2011  . Cough with sputum 12/06/2010  . H/O: pneumonia 07/23/2010  . DYSPNEA 02/05/2010  . SLEEP APNEA, OBSTRUCTIVE 06/13/2009  . WHEELCHAIR DEPENDENCE 06/13/2009  . MENTAL RETARDATION 05/30/2009  . 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 CORTICAL BLINDNESS 05/30/2009  . HEARING IMPAIRMENT 05/30/2009  . 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 SEIZURE DISORDER 05/30/2009    Significant Diagnostics:  LABS:  Basename 07/06/11 0530 07/03/11 1500 07/02/11 0655 07/01/11 0705 06/30/11 2021 06/30/11 1010  WBC 7.4 8.3 5.3 9.2 10.6* 9.4  HGB 15.2 13.9 13.4 13.8 14.2 16.7  HCT 44.9 41.3 39.5 41.1 40.3 47.5  PLT 191 137* 139* 149* 155 145*    Basename 07/03/11 1500 07/02/11 0655 07/01/11 0705 06/30/11 2021 06/30/11 1010  NA 135 139 140 -- 135  K 3.9 4.0 4.0 -- 3.9  CL 97 101 105 -- 96  CO2 30 28 30  -- 29  BUN 6 5* 6 -- 6  CREATININE 0.24* <0.20* <0.20* 0.24* 0.23*  CALCIUM 9.4 9.5 9.4 -- 9.8  GLUCOSE 110* 127* 92 -- 86    Hematology:  Basename 06/30/11 1010  LYMPHSABS 2.6  MONOABS 1.2*  EOSABS 0.3  BASOSABS 0.0    Basename 07/06/11 0530 07/03/11 1500 07/02/11 0655  RDW 13.7 13.8 13.9  MCV 93.0 92.0 92.3  MCHC 33.9 33.7 33.9  MRET -- -- --    Basename 07/01/11 2007  PHENYTOIN --  PHENOBARB --  VALPROATE 68.4  CBMZ --  LITHIUM --    MICRO: < > BAL - Pseudomonas - CIPRO sensitive  IMAGING: Dg Chest Port 1 View  07/06/2011  *RADIOLOGY REPORT*  Clinical Data: Left pleural effusion and pneumonia  PORTABLE CHEST - 1 VIEW  Comparison: Chest radiograph 07/03/2011  Findings: Posterior thoracic fusion noted.  Stable cardiac silhouette.  There is improvement in left lower lobe atelectasis compared to prior.  There remains dense left lower lobe atelectasis.  No pulmonary edema.  No pneumothorax.  IMPRESSION:  1.  Improvement in dense left lower lobe atelectasis.  Original Report Authenticated By: Genevive Bi, M.D.   Dg Chest Port 1 View  07/03/2011  *RADIOLOGY REPORT*  Clinical Data: Short breath.  Question pneumonia appear  PORTABLE CHEST - 1 VIEW  Comparison: A portable chest 07/01/2011.  Findings: The heart is enlarged.  New left lower lobe airspace disease is concerning for pneumonia.  A left pleural effusion is present as well.  The right lung is clear.  IMPRESSION:  1.  Progressive left lower lobe airspace disease compatible with pneumonia. 2.  Moderate-sized left pleural effusion.  Original Report Authenticated By: Jamesetta Orleans. MATTERN, M.D.  Dg Chest Port 1 View  07/01/2011  *RADIOLOGY REPORT*  Clinical Data: Follow up pneumonia  PORTABLE CHEST - 1 VIEW  Comparison: 06/30/2011  Findings: Cardiomediastinal silhouette is stable.  Spinal fixation rods thoracolumbar spine again noted.  No pulmonary edema.  There is  residual left basilar atelectasis or infiltrate with slight improved aeration.  IMPRESSION: Residual left basilar atelectasis or infiltrate with slight improved aeration.  No pulmonary  edema.  Original Report Authenticated By: Natasha Mead, M.D.   Dg Chest Portable 1 View  06/30/2011  *RADIOLOGY REPORT*  Clinical Data: Respiratory distress  PORTABLE CHEST - 1 VIEW  Comparison: 12/06/2010  Findings: Cardiomediastinal silhouette is stable.  Spinal fixation rods thoracolumbar spine again noted.  No pulmonary edema.  There is subtle hazy left basilar atelectasis or infiltrate.  IMPRESSION: Subtle hazy left basilar atelectasis or infiltrate.  No pulmonary edema.  Spinal fixation rods thoracolumbar spine again noted.  Original Report Authenticated By: Natasha Mead, M.D.    Procedures:   Chest PT  Discharge Vitals and Exam:  Vitals: BP 91/60  Pulse 100  Temp(Src) 98.1 F (36.7 C) (Axillary)  Resp 16  Ht 4\' 10"  (1.473 m)  Wt 93 lb 11.1 oz (42.5 kg)  BMI 19.58 kg/m2  SpO2 94%  Wt Readings from Last 3 Encounters:  07/08/11 93 lb 11.1 oz (42.5 kg)   PE: GENERAL: Young Adult MR/CP male. Examined in [facility] 2600. Lying in bed with L preference no respiratory distress.  PSYCH: Not interactive or attentive to conversation; occasional eye contact while being examined H&N: AT/[place], MMM, no scleral icterus, EOMi  NECK: spastic flexion contracture with loss of cervical lordosis; L torticollis  EYES: L eye with conjunctival irritation and abrasion over L lateral angle of eye - improved  THORAX:  LUNGS: Breathsounds improved with coarse breath sounds in B bases but no wheezing and no increased work of breathing above baseline HEART: RRR, S1/S2 heard, no murmur  ABDOMEN: GTube in place and running; no abdominal distention; no rigidity  EXTREMITIES: spastic contractures in all extremities    Discharge Information   Disposition: To Home with home health Discharge Diet: Resume diet Discharge Condition:  Improved Discharge Activity: Ad lib  Medication List  As of 07/10/2011  5:36 AM   TAKE these medications         artificial tears Oint ophthalmic ointment   Apply to eye every 4 (four)  hours as needed. Please provide bottle used in hospital      baclofen 10 MG tablet   Commonly known as: LIORESAL   Take 20 mg by mouth 3 (three) times daily.      ciprofloxacin 500 MG tablet   Commonly known as: CIPRO   Take 1 tablet (500 mg total) by mouth every 12 (twelve) hours.      diazepam 2.5 MG Gel   Commonly known as: DIASTAT   Unknown dose. Prescribed by Dr. Sharene Skeans, neurology      divalproex 125 MG capsule   Commonly known as: DEPAKOTE SPRINKLE   3 caps per tube at 6 am, 2 caps per tube at 2 pm, 3 caps per tube at 9 pm      gabapentin 300 MG capsule   Commonly known as: NEURONTIN   1 tab per tube three times a day      Gerhardt's butt cream Crea   Apply 1 application topically 2 (two) times daily. Please provide remained of tube used in hospital      glycopyrrolate 1 MG  tablet   Commonly known as: ROBINUL   Take 1 mg by mouth 3 (three) times daily.      lansoprazole 30 MG capsule   Commonly known as: PREVACID   Take 30 mg by mouth daily.      PROAIR HFA 108 (90 BASE) MCG/ACT inhaler   Generic drug: albuterol   Inhale 2 puffs into the lungs every 4 (four) hours as needed. Shortness of breath      albuterol (2.5 MG/3ML) 0.083% nebulizer solution   Commonly known as: PROVENTIL   Take 2.5 mg by nebulization every 6 (six) hours as needed. Shortness of breath             Follow Up Appointments:    Discharge Orders    Future Appointments: Provider: Department: Dept Phone: Center:   07/11/2011 11:15 AM Reginold Agent, MD [facility] [phone] [facility]     Future Orders Please Complete By Expires   Diet - low sodium heart healthy      Increase activity slowly      Discharge instructions      Comments:   Please call and schedule an appointment with Dr. Delton Coombes for sometime in the next 1-2 months.  Please follow up with Dr. Hulen Luster on Friday as scheduled.     Follow-up Information    Follow up with Ellery Plunk, MD on 07/11/2011. (@ 1115)     Contact information:   [address] [place] [address] [phone]       Schedule an appointment as soon as possible for a visit with BYRUM,ROBERT S., MD. (in 1 - 2 months)    Contact information:   [address] [employer] [place] [address] [phone]            Brief Hospital Course & Follow up Issues   Pending Results: none  [name] is a 21 y.o. year old male who presented following a hypoxic episode while riding the bus to school.  He was reported to have been ashen and pale and found to have a pulse ox in the 50s at the time of EMS arrival. He was found to have a likely PNA on CXR and was admitted for futher care.  CCM/Pulmonlogy was consulted   21 yo M with CP/MR and h/o of multiple pneumonias p/w sudden respiratory distress and likely PNA on CXR.  The remainder of his hospital course is as follows:   1. RESP - (Respiratory Distress, CAP, Sleep Apnea, PULMONARY EFFUSION) - Pt hypoxia reported to the 50s on RA prior to arrival to ED.  Improved with O2, and albuterol treatment.  History of recurrent PNA with prior mucous plugging due to underlying CP.  Pt does NOT use CPAP at home - Followed by Dr. Delton Coombes as OP *Xopenex q4o+q2 *CIPRO-CHEST PT - RT therapy including: Chest PT; aggressive NT suctioning; vibratory vest - 5/8 - O2 Sats improving as is resp distress.  Weaning O2 - Pulm Consult for discussion with family regarding Trach placement for management of secretions and profound documendated OSA.  Currently, family not inclined to pursue [facility] and wishes for DNI with full medical recussitation in the event [place] would need it.  Pt was provided with out of facilty DNI form.  - CIPRO & CEFOTAZ for double coverage of Pseudomonas > narrowed to CIPRO PO < > RESP CULTURE - few pseudomonas - FINAL Sensitive to CIPRO - Current plan for 21 days of ABX for pseudomonas.   -  Transition to antipseudomonal po abx  (Cipro) following sensitivies for 21 day course - 5/9 AM - Acute hypoxic episode requiring NT Suctioning via Rapid response team; albuterol tx had been spaced to q8o.   - 5/9PM - Moderate PULMONARY EFFUSION noted on 1 view Chest X-ray.  - Continue to follow clinically  Pt discharged with home O2 to help maintain sats at night as known OSA and not a candidate for NIPPV.   Follow up pulmonary effusion with repeat CXR/CT in 1 month.  2. CV: (Tachycarida) - Pt with baseline HR of 90-110s.  Elevated to 130s currently with events to 160s.   [facility] Course     Will continue to monitor  Improved on 5/10                                      Plan / Followup  Follow up tachycardia    3. NEURO - (Seizure Disorder, MR/CP) - Typically has small, daily seizures but no generalized/grand mal; continued on home regimen while acutely hospitalized *Depakote Sprinkles *Gabapentin *Baclofen *Robinol - Depakote Regimen = 375mg  @ 6AM & 9PM + 250mg @2PM  - Small daily seizures while hospitalized but baseline in character  - stable at baseline  4. SKIN - (Pressure Ulcer R Sacrum ~1cm, L Eye Abrasion) *Lacrilube - Eye with small abrasion on Lateral Fold - no evidence of infection  - Continue Frequent turning; ulcer improved during hospitalization   Andrena Mews, DO [facility] Resident - PGY-1 07/10/2011 5:36 AM

## 2011-07-08 NOTE — Progress Notes (Signed)
DISCHARGED HOME  ACCOMPANIED  BY HIS MOTHER , STABLE, BELONGINGS AND DISCHARGE INSTRUCTIONS  GIVEN.

## 2011-07-08 NOTE — Progress Notes (Signed)
FMTS Attending Patient seen and examined by me; please see my separate note of 07/07/2011. Paula Compton, MD

## 2011-07-11 ENCOUNTER — Ambulatory Visit (INDEPENDENT_AMBULATORY_CARE_PROVIDER_SITE_OTHER): Payer: BC Managed Care – PPO | Admitting: Family Medicine

## 2011-07-11 ENCOUNTER — Encounter: Payer: Self-pay | Admitting: Family Medicine

## 2011-07-11 ENCOUNTER — Telehealth: Payer: Self-pay | Admitting: Emergency Medicine

## 2011-07-11 VITALS — BP 120/75 | HR 90 | Temp 97.7°F

## 2011-07-11 DIAGNOSIS — J189 Pneumonia, unspecified organism: Secondary | ICD-10-CM

## 2011-07-11 DIAGNOSIS — Z993 Dependence on wheelchair: Secondary | ICD-10-CM

## 2011-07-11 NOTE — Patient Instructions (Addendum)
Please have your xray done mid June I will call with the result   Follow up with Dr. Delton Coombes for pulmonology  We will call for the PT referral

## 2011-07-11 NOTE — Telephone Encounter (Signed)
Called and offered appt w/ TP.  Mother aware pt has appt scheduled 6/15 @ 11:00 with TP.

## 2011-07-11 NOTE — Assessment & Plan Note (Addendum)
To finish his 21 day course of Cipro. I reviewed with mom that this may lower his seizure threshold. She knows to watch him closely and she has diastat if she needs it. Ordered chest x-ray today to recheck pulmonary effusion. They will get an appointment with pulmonology.

## 2011-07-11 NOTE — Assessment & Plan Note (Signed)
Send physical therapy for evaluation for home mobility aids.

## 2011-07-11 NOTE — Telephone Encounter (Signed)
Crystal and RB please advise if and where we can add patient in on schedule; next slot open isn't until 09-09-2011. Thanks.

## 2011-07-11 NOTE — Progress Notes (Signed)
  Subjective:    Patient ID: [name], male    DOB: 1990/06/20, 21 y.o.   MRN: [id number]  HPI Mauro returned with his mother for followup after hospitalization. Since hospitalization he is been getting oxygen at night. Mom has been sleeping in his room to monitor him. She states that his albuterol and his chest PT. Yesterday he had a seizure lasting 1-2 minutes. He often has seizures, but this is long for him. He continues on antibiotics. He has not had any more fevers and has not had any changes in bowel movements. She does report candidal infection in the diaper area. She has a nystatin cream for this.  Overall, she thinks he is close to baseline with his breathing and function.  She would like to get a shower chair to help in the bathroom. She would like to get a physical therapy evaluation for this.  Review of Systems No fevers, increasing wheezing or cough    Objective:   Physical Exam Gen.-alert, looking around the room. Breathing comfortably. Patient had a seizure lasting less than 30 seconds while in the room. Heart - normal rate, regular rhythm, normal S1, S2, no murmurs, rubs, clicks or gallops Chest - clear to auscultation, no wheezes, rales or rhonchi, symmetric air entry, no tachypnea, retractions or cyanosis        Assessment & Plan:

## 2011-07-24 ENCOUNTER — Encounter: Payer: Self-pay | Admitting: Family Medicine

## 2011-07-24 ENCOUNTER — Ambulatory Visit (INDEPENDENT_AMBULATORY_CARE_PROVIDER_SITE_OTHER): Payer: BC Managed Care – PPO | Admitting: Family Medicine

## 2011-07-24 VITALS — BP 90/68 | HR 98 | Temp 97.4°F

## 2011-07-24 DIAGNOSIS — J189 Pneumonia, unspecified organism: Secondary | ICD-10-CM

## 2011-07-24 NOTE — Patient Instructions (Signed)
Bingham's lungs sounds clear today  Seek medical care for fever, trouble breathing, or other concerns

## 2011-07-24 NOTE — Progress Notes (Signed)
  Subjective:    Patient ID: [name], male    DOB: [birth year], 21 y.o.   MRN: [id number]  HPI  21 yo brought here by mom for cough   PMH sig for CP, non communicative.  Recent hospitalization May 5-14 for pseudomonas pneumonia.  Finished course of cipro yesterday.  Stools soft but not loose or watery,  Mom states cough is new for past three days and hears more rattling in his chest.  No fever.  Is tired.  Does not communicate, symptoms difficult to elicit.  I have reviewed patient's  PMH, FH, and Social history and Medications as related to this visit.  Review of Systemssee HPI     Objective:   Physical Exam GEN: wheelchair -bound.  Blind. Nonverbal. CV: RRR Resp:  CTAB.       Assessment & Plan:

## 2011-07-24 NOTE — Assessment & Plan Note (Signed)
Pneumonia resolving.  Oxygen sat and chest ascultation reassuring.  Advised continue pulmonary toilet, close monitoring.  Discussed red flags to seek medical care.    Has pulmonary follow-up in 2-3 weeks.

## 2011-07-25 ENCOUNTER — Ambulatory Visit (HOSPITAL_COMMUNITY)
Admission: RE | Admit: 2011-07-25 | Discharge: 2011-07-25 | Disposition: A | Discharge status: Home / Self Care | Payer: BC Managed Care – PPO | Source: Ambulatory Visit | Attending: Interventional Radiology | Admitting: Interventional Radiology

## 2011-07-25 ENCOUNTER — Other Ambulatory Visit (HOSPITAL_COMMUNITY): Payer: Self-pay | Admitting: Interventional Radiology

## 2011-07-25 DIAGNOSIS — G809 Cerebral palsy, unspecified: Secondary | ICD-10-CM

## 2011-07-25 DIAGNOSIS — Z434 Encounter for attention to other artificial openings of digestive tract: Secondary | ICD-10-CM | POA: Insufficient documentation

## 2011-07-25 MED ORDER — IOHEXOL 300 MG/ML  SOLN
50.0000 mL | Freq: Once | INTRAMUSCULAR | Status: AC | PRN
  Administered 2011-07-25: 20 mL

## 2011-07-28 ENCOUNTER — Telehealth (HOSPITAL_COMMUNITY): Payer: Self-pay | Admitting: *Deleted

## 2011-07-28 NOTE — Telephone Encounter (Signed)
Message left on answering machine to call radiology if any questions or concerns.

## 2011-07-28 NOTE — Telephone Encounter (Signed)
Radiology post procedure call. Message left in VM to call radiology if any questions or concerns.

## 2011-08-06 ENCOUNTER — Other Ambulatory Visit: Payer: Self-pay | Admitting: Family Medicine

## 2011-08-06 DIAGNOSIS — Z993 Dependence on wheelchair: Secondary | ICD-10-CM

## 2011-08-12 ENCOUNTER — Inpatient Hospital Stay: Payer: BC Managed Care – PPO | Admitting: Adult Health

## 2011-08-15 ENCOUNTER — Ambulatory Visit
Admission: RE | Admit: 2011-08-15 | Discharge: 2011-08-15 | Disposition: A | Discharge status: Home / Self Care | Payer: BC Managed Care – PPO | Source: Ambulatory Visit | Attending: Family Medicine | Admitting: Family Medicine

## 2011-08-15 DIAGNOSIS — J189 Pneumonia, unspecified organism: Secondary | ICD-10-CM

## 2011-08-19 ENCOUNTER — Encounter: Payer: Self-pay | Admitting: Adult Health

## 2011-08-19 ENCOUNTER — Ambulatory Visit (INDEPENDENT_AMBULATORY_CARE_PROVIDER_SITE_OTHER): Payer: BC Managed Care – PPO | Admitting: Adult Health

## 2011-08-19 VITALS — BP 100/80 | HR 68 | Temp 96.9°F

## 2011-08-19 DIAGNOSIS — J189 Pneumonia, unspecified organism: Secondary | ICD-10-CM

## 2011-08-19 DIAGNOSIS — G4733 Obstructive sleep apnea (adult) (pediatric): Secondary | ICD-10-CM

## 2011-08-19 NOTE — Progress Notes (Signed)
  Subjective:    Patient ID: [name], male    DOB: 03-31-1990, 21 y.o.   MRN: [id number]  HPI 21 yo man, hx of cerebral palsy. He is dependent for his care, lives at home with mom and dad. Had been followed for secretion management, recurrent PNA's by Dr Donnie Coffin at [facility]. Never required intubation. Followed by Dr Sharene Skeans with Neurology. He gets suctioned occasionally but not frequently (few times a month). Not currently using pulmicort nebs at this time. He is on albuterol nebs prn. Uses chest vest bid. Condition Complicated by impaired secretion management    08/19/2011 [facility]  Admitted 5/6-5/14 for CAP -Psuedomonas. Tx w/ 21 days of Cipro.  Pulmonary consult to discuss possible trach placement , family declined w/ DNI now in place. Placed on O2 at night at discharge.  Seen by [facility] 6/21 -cxr follow up 6/21 w/ clearance of PNA  Does Vest Twice daily      Review of Systems  Constitutional:   No  weight loss, night sweats,  Fevers, chills, fatigue, or  lassitude.  HEENT:   No headaches,  Difficulty swallowing,  Tooth/dental problems, or  Sore throat,                No sneezing, itching, ear ache, nasal congestion, post nasal drip,   CV:  No chest pain,  Orthopnea, PND, swelling in lower extremities, anasarca, dizziness, palpitations, syncope.   GI  No heartburn, indigestion, abdominal pain, nausea, vomiting, diarrhea, change in bowel habits, loss of appetite, bloody stools.   Resp: No shortness of breath with exertion or at rest.  No excess mucus, no productive cough,  No non-productive cough,  No coughing up of blood.  No change in color of mucus.  No wheezing.  No chest wall deformity  Skin: no rash or lesions.  GU: no dysuria, change in color of urine, no urgency or frequency.  No flank pain, no hematuria   MS:  No joint pain or swelling.  No decreased range of motion.  No back pain.  Psych:  No change in mood or affect. No depression or anxiety.  No memory  loss.        Objective:   Physical Exam  Gen: Debilitated man in wheelchair, non-communicative  ENT: No lesions,  mouth clear,  oropharynx clear, poor dentition, no postnasal drip, L eye lateral gaze  Neck: No stridor, UA appears to be clear  Lungs: No use of accessory muscles, no dullness to percussion, clear without rales or rhonchi  Cardiovascular: RRR, heart sounds normal, no murmur or gallops, no peripheral edema  Abdomen: PEG in place.   Musculoskeletal: No deformities, no cyanosis or clubbing  Neuro: alert, non focal  Skin: Warm, no lesions or rashes     Assessment & Plan:

## 2011-08-19 NOTE — Assessment & Plan Note (Signed)
Continue on current pulmonary regimen.

## 2011-08-19 NOTE — Patient Instructions (Addendum)
Use albuterol nebulizers as needed Continue your suctioning as needed Continue your Chest Vest twice a day.  Follow up with Dr Delton Coombes in 3 months and As needed

## 2011-08-19 NOTE — Assessment & Plan Note (Signed)
Continue on nocturnal O2  Not candidate for CPAP

## 2011-08-20 ENCOUNTER — Ambulatory Visit (HOSPITAL_COMMUNITY)
Admission: RE | Admit: 2011-08-20 | Discharge: 2011-08-20 | Disposition: A | Discharge status: Home / Self Care | Payer: BC Managed Care – PPO | Source: Ambulatory Visit | Attending: Interventional Radiology | Admitting: Interventional Radiology

## 2011-08-20 ENCOUNTER — Other Ambulatory Visit (HOSPITAL_COMMUNITY): Payer: Self-pay | Admitting: Interventional Radiology

## 2011-08-20 DIAGNOSIS — G809 Cerebral palsy, unspecified: Secondary | ICD-10-CM

## 2011-08-20 DIAGNOSIS — Z434 Encounter for attention to other artificial openings of digestive tract: Secondary | ICD-10-CM | POA: Insufficient documentation

## 2011-08-20 NOTE — Procedures (Signed)
20 fr J tube replaced No comp Full report in pacs

## 2011-08-27 ENCOUNTER — Ambulatory Visit: Payer: BC Managed Care – PPO | Admitting: Physical Therapy

## 2011-09-19 ENCOUNTER — Other Ambulatory Visit: Payer: Self-pay | Admitting: *Deleted

## 2011-09-21 ENCOUNTER — Other Ambulatory Visit: Payer: Self-pay | Admitting: Family Medicine

## 2011-09-22 MED ORDER — OMEPRAZOLE 2 MG/ML ORAL SUSPENSION: 10.0000 mg | Freq: Every day | ORAL | Status: DC

## 2011-09-26 ENCOUNTER — Telehealth: Payer: Self-pay | Admitting: Family Medicine

## 2011-09-26 NOTE — Telephone Encounter (Signed)
Needs to get another  - kangaroo joey pump set with flush bag - - ref# [id number] - [facility] fax [phone] - needs to run script for a year if possible Was been getting from pediatrics but he has aged out of program  pls let me know

## 2011-09-29 MED ORDER — KANGAROO JOEY ENTERAL PUMP MISC: 1.0000 | Status: DC | PRN

## 2011-09-29 NOTE — Telephone Encounter (Signed)
Will you please fax a refill for this?

## 2011-09-29 NOTE — Telephone Encounter (Signed)
Rx in.  Also, please advise patient to follow-up for appointment within month so I can meet him.  Thank you.

## 2011-09-29 NOTE — Telephone Encounter (Signed)
We will need an order placed before we can print it out and fax it.  You can either do it as an order or write a paper Rx. [name], [name]

## 2011-09-29 NOTE — Addendum Note (Signed)
Addended by: Madolyn Frieze, Marylene Land J on: 09/29/2011 03:30 PM   Modules accepted: Orders

## 2011-09-30 NOTE — Telephone Encounter (Signed)
Rx faxed to number provided

## 2011-10-01 MED ORDER — KANGAROO JOEY ENTERAL PUMP MISC: 30.0000 [IU] | Freq: Every day | Status: DC

## 2011-10-01 NOTE — Telephone Encounter (Signed)
Patient has appointment 08/26 Will give refills for Kangaroo enteral feeding device Spoke with mother. Should be changed daily   ASHA: Please fax to [facility] with new order with refills

## 2011-10-01 NOTE — Addendum Note (Signed)
Addended by: Madolyn Frieze, Marylene Land J on: 10/01/2011 02:00 PM   Modules accepted: Orders

## 2011-10-01 NOTE — Telephone Encounter (Signed)
New Order faxed to [employer] [facility].

## 2011-10-19 ENCOUNTER — Other Ambulatory Visit: Payer: Self-pay | Admitting: Family Medicine

## 2011-10-20 ENCOUNTER — Encounter: Payer: Self-pay | Admitting: Family Medicine

## 2011-10-20 ENCOUNTER — Ambulatory Visit (INDEPENDENT_AMBULATORY_CARE_PROVIDER_SITE_OTHER): Payer: BC Managed Care – PPO | Admitting: Family Medicine

## 2011-10-20 VITALS — BP 104/71 | HR 85 | Temp 97.1°F

## 2011-10-20 DIAGNOSIS — R569 Unspecified convulsions: Secondary | ICD-10-CM

## 2011-10-20 DIAGNOSIS — G809 Cerebral palsy, unspecified: Secondary | ICD-10-CM

## 2011-10-20 DIAGNOSIS — G4733 Obstructive sleep apnea (adult) (pediatric): Secondary | ICD-10-CM

## 2011-10-20 DIAGNOSIS — R1310 Dysphagia, unspecified: Secondary | ICD-10-CM

## 2011-10-20 MED ORDER — KANGAROO JOEY ENTERAL PUMP MISC: 30.0000 [IU] | Freq: Every day | Status: AC

## 2011-10-20 NOTE — Assessment & Plan Note (Signed)
Will refer to local GI specialists for PEG tube management. Previously followed by Peds GI at [facility], however, patient now adult and advised to seek other provider. His mother requesting someone in [place].

## 2011-10-20 NOTE — Progress Notes (Signed)
  Subjective:    Patient ID: [name], male    DOB: [birth year], 21 y.o.   MRN: [id number]  HPI Routine follow-up, no acute issues, medication refills.  Spasms are controlled with baclofen  Secretions are controlled with glycopyrrolate  She is requesting GI referral to someone in [place]. He was followed by Pediatric GI for PEG tube management in the past but has been discharged from service due to his now being an adult. He has dysphagia and takes nothing by mouth  No recent history of seizures. Followed by DR. Hickling and compliant with Depakote and gabapentin  Review of Systems Per HPI Occasional constipation alleviated by prn Miralax Pneumonia spring 2013--resolved. No dyspnea    Objective:   Physical Exam GEN: NAD CV: RRR PULM: NI WOB; CTAB without wheezes or rales; good breath sounds bilateral lungs with normal (not deep) breaths ABD: soft, NT, ND; PEG tube site clean/dry/intact without rashes EXT: no edema; contractures bilateral feet; wheelchair-bound    Assessment & Plan:

## 2011-10-20 NOTE — Patient Instructions (Addendum)
Follow-up in 6 months for a check-up or sooner if needed

## 2011-10-20 NOTE — Telephone Encounter (Signed)
Pediatric dosing for drooling indication for glycopyrrolate 0.1 mg/kg tid. He is within limits.  Will refill.

## 2011-10-20 NOTE — Assessment & Plan Note (Signed)
Continue baclofen for spasms

## 2011-11-04 ENCOUNTER — Encounter: Payer: Self-pay | Admitting: Family Medicine

## 2011-11-04 ENCOUNTER — Encounter: Payer: Self-pay | Admitting: Emergency Medicine

## 2011-11-04 ENCOUNTER — Ambulatory Visit (INDEPENDENT_AMBULATORY_CARE_PROVIDER_SITE_OTHER): Payer: BC Managed Care – PPO | Admitting: Emergency Medicine

## 2011-11-04 VITALS — BP 102/60 | HR 79 | Wt 95.0 lb

## 2011-11-04 DIAGNOSIS — G809 Cerebral palsy, unspecified: Secondary | ICD-10-CM

## 2011-11-04 DIAGNOSIS — Z23 Encounter for immunization: Secondary | ICD-10-CM

## 2011-11-04 DIAGNOSIS — R1310 Dysphagia, unspecified: Secondary | ICD-10-CM

## 2011-11-04 NOTE — Assessment & Plan Note (Signed)
Documentation only. Saw [facility] Dr. Madilyn Fireman 09/06. Continue omeprazole, Miralax prn, feeding supplies provided. Follow-up 1 year.

## 2011-11-04 NOTE — Progress Notes (Signed)
  Subjective:    Patient ID: [name], male    DOB: 09-14-1990, 21 y.o.   MRN: [id number]  HPI 21 yo man, hx of cerebral palsy. He is dependent for his care, lives at home with mom and dad. Had been followed for secretion management, recurrent PNA's by Dr Donnie Coffin at [facility]. Never required intubation. Followed by Dr Sharene Skeans with Neurology. He gets suctioned occasionally but not frequently (few times a month). Not currently using pulmicort nebs at this time. He is on albuterol nebs prn. Uses chest vest bid. Condition Complicated by impaired secretion management    [facility] 08/19/11 --  Admitted 5/6-5/14 for CAP -Psuedomonas. Tx w/ 21 days of Cipro.  Pulmonary consult to discuss possible trach placement , family declined w/ DNI now in place. Placed on O2 at night at discharge.  Seen by [facility] 6/21 -cxr follow up 6/21 w/ clearance of PNA  Does Vest Twice daily    ROV 11/04/11 -- Hx cerebral palsy, recurrent PNA's. Severe CAP as above, now recovered. He is being suctioned rarely. His family had conversations about code status, trach, etc during hospitalization. They do not want him ventilated or to receive CPR, but they MIGHT accept trach if it were for maintenance like suctioning etc.       Objective:   Physical Exam Filed Vitals:   11/04/11 1642  BP: 102/60  Pulse: 79   Gen: Debilitated man in wheelchair, non-communicative  ENT: No lesions,  mouth clear,  oropharynx clear, poor dentition, no postnasal drip, L eye lateral gaze  Neck: No stridor, UA appears to be clear  Lungs: No use of accessory muscles, no dullness to percussion, clear without rales or rhonchi  Cardiovascular: RRR, heart sounds normal, no murmur or gallops, no peripheral edema  Abdomen: PEG in place.   Musculoskeletal: No deformities, no cyanosis or clubbing  Neuro: alert, non focal  Skin: Warm, no lesions or rashes     Assessment & Plan:  CEREBRAL PALSY With recurrent PNA's and probable  intermittent aspiration. Good discussion with his mother today regarding goals of care. She would not want him ventilated or resuscitated, but would consider tracheostomy as a management tool  - ie to allow suctioning, etc. She would not want an emergent airway, etc.  - continue albuterol prn - continue chest vest bid

## 2011-11-04 NOTE — Assessment & Plan Note (Signed)
With recurrent PNA's and probable intermittent aspiration. Good discussion with his mother today regarding goals of care. She would not want him ventilated or resuscitated, but would consider tracheostomy as a management tool  - ie to allow suctioning, etc. She would not want an emergent airway, etc.  - continue albuterol prn - continue chest vest bid

## 2011-11-04 NOTE — Patient Instructions (Addendum)
Please continue to do your chest vest treatments every day.  Use albuterol as needed I would encourage you to record officially your wishes and our discussions about ventilation and tracheostomy.  Flu shot today Follow with Dr Delton Coombes in 6 months or sooner if you have any problems

## 2011-12-02 ENCOUNTER — Encounter: Payer: Self-pay | Admitting: Family Medicine

## 2011-12-02 DIAGNOSIS — R569 Unspecified convulsions: Secondary | ICD-10-CM

## 2011-12-02 NOTE — Assessment & Plan Note (Addendum)
Documentation only. Dr. Sharene Skeans appointment 10/06. No changes made. Increase Miralax for constipation. Follow-up 6 months.

## 2011-12-08 ENCOUNTER — Telehealth: Payer: Self-pay | Admitting: Emergency Medicine

## 2011-12-08 NOTE — Telephone Encounter (Signed)
I spoke with pt mother [name] and she stated pt is due for CAPP services renewal. His case manager needs a short summary letter stating pt's condition and his treatment. [name] would like to pick this up when ready. Please advise RB thanks

## 2011-12-10 ENCOUNTER — Other Ambulatory Visit: Payer: Self-pay | Admitting: *Deleted

## 2011-12-11 ENCOUNTER — Other Ambulatory Visit: Payer: Self-pay | Admitting: Family Medicine

## 2011-12-11 MED ORDER — OMEPRAZOLE 2 MG/ML ORAL SUSPENSION: 10.0000 mg | Freq: Every day | ORAL | Status: DC

## 2011-12-11 MED ORDER — OMEPRAZOLE 2 MG/ML ORAL SUSPENSION: 20.0000 mg | Freq: Every day | ORAL | Status: DC

## 2011-12-17 ENCOUNTER — Ambulatory Visit (INDEPENDENT_AMBULATORY_CARE_PROVIDER_SITE_OTHER): Payer: BC Managed Care – PPO | Admitting: Family Medicine

## 2011-12-17 ENCOUNTER — Inpatient Hospital Stay (HOSPITAL_COMMUNITY)
Admission: AD | Admit: 2011-12-17 | Discharge: 2012-01-05 | DRG: 541 | Disposition: A | Discharge status: Home Health | Payer: BC Managed Care – PPO | Source: Ambulatory Visit | Attending: Family Medicine | Admitting: Family Medicine

## 2011-12-17 ENCOUNTER — Inpatient Hospital Stay (HOSPITAL_COMMUNITY): Payer: BC Managed Care – PPO

## 2011-12-17 ENCOUNTER — Encounter (HOSPITAL_COMMUNITY): Payer: Self-pay | Admitting: General Practice

## 2011-12-17 VITALS — BP 104/68 | HR 122 | Temp 97.8°F

## 2011-12-17 DIAGNOSIS — G808 Other cerebral palsy: Secondary | ICD-10-CM

## 2011-12-17 DIAGNOSIS — J45909 Unspecified asthma, uncomplicated: Secondary | ICD-10-CM | POA: Diagnosis present

## 2011-12-17 DIAGNOSIS — IMO0002 Reserved for concepts with insufficient information to code with codable children: Secondary | ICD-10-CM | POA: Diagnosis present

## 2011-12-17 DIAGNOSIS — H47619 Cortical blindness, unspecified side of brain: Secondary | ICD-10-CM | POA: Diagnosis present

## 2011-12-17 DIAGNOSIS — R5381 Other malaise: Secondary | ICD-10-CM | POA: Diagnosis present

## 2011-12-17 DIAGNOSIS — G40909 Epilepsy, unspecified, not intractable, without status epilepticus: Secondary | ICD-10-CM | POA: Diagnosis present

## 2011-12-17 DIAGNOSIS — R569 Unspecified convulsions: Secondary | ICD-10-CM

## 2011-12-17 DIAGNOSIS — F79 Unspecified intellectual disabilities: Secondary | ICD-10-CM | POA: Diagnosis present

## 2011-12-17 DIAGNOSIS — K219 Gastro-esophageal reflux disease without esophagitis: Secondary | ICD-10-CM | POA: Diagnosis present

## 2011-12-17 DIAGNOSIS — R1310 Dysphagia, unspecified: Secondary | ICD-10-CM | POA: Diagnosis present

## 2011-12-17 DIAGNOSIS — R05 Cough: Secondary | ICD-10-CM

## 2011-12-17 DIAGNOSIS — Z79899 Other long term (current) drug therapy: Secondary | ICD-10-CM

## 2011-12-17 DIAGNOSIS — G809 Cerebral palsy, unspecified: Secondary | ICD-10-CM | POA: Diagnosis present

## 2011-12-17 DIAGNOSIS — K9423 Gastrostomy malfunction: Secondary | ICD-10-CM | POA: Diagnosis present

## 2011-12-17 DIAGNOSIS — J9600 Acute respiratory failure, unspecified whether with hypoxia or hypercapnia: Secondary | ICD-10-CM

## 2011-12-17 DIAGNOSIS — R0602 Shortness of breath: Secondary | ICD-10-CM | POA: Diagnosis present

## 2011-12-17 DIAGNOSIS — K117 Disturbances of salivary secretion: Secondary | ICD-10-CM

## 2011-12-17 DIAGNOSIS — Y849 Medical procedure, unspecified as the cause of abnormal reaction of the patient, or of later complication, without mention of misadventure at the time of the procedure: Secondary | ICD-10-CM | POA: Diagnosis present

## 2011-12-17 DIAGNOSIS — R52 Pain, unspecified: Secondary | ICD-10-CM

## 2011-12-17 DIAGNOSIS — R0902 Hypoxemia: Secondary | ICD-10-CM

## 2011-12-17 DIAGNOSIS — R000 Tachycardia, unspecified: Secondary | ICD-10-CM | POA: Diagnosis present

## 2011-12-17 DIAGNOSIS — J189 Pneumonia, unspecified organism: Secondary | ICD-10-CM

## 2011-12-17 DIAGNOSIS — T17308A Unspecified foreign body in larynx causing other injury, initial encounter: Secondary | ICD-10-CM | POA: Diagnosis present

## 2011-12-17 DIAGNOSIS — Z66 Do not resuscitate: Secondary | ICD-10-CM | POA: Diagnosis present

## 2011-12-17 DIAGNOSIS — J9620 Acute and chronic respiratory failure, unspecified whether with hypoxia or hypercapnia: Secondary | ICD-10-CM | POA: Diagnosis present

## 2011-12-17 DIAGNOSIS — J159 Unspecified bacterial pneumonia: Secondary | ICD-10-CM

## 2011-12-17 DIAGNOSIS — J129 Viral pneumonia, unspecified: Principal | ICD-10-CM | POA: Diagnosis present

## 2011-12-17 DIAGNOSIS — Z993 Dependence on wheelchair: Secondary | ICD-10-CM

## 2011-12-17 DIAGNOSIS — Z515 Encounter for palliative care: Secondary | ICD-10-CM

## 2011-12-17 DIAGNOSIS — G4733 Obstructive sleep apnea (adult) (pediatric): Secondary | ICD-10-CM | POA: Diagnosis present

## 2011-12-17 DIAGNOSIS — Z9981 Dependence on supplemental oxygen: Secondary | ICD-10-CM

## 2011-12-17 HISTORY — DX: Personal history of other diseases of the digestive system: Z87.19

## 2011-12-17 HISTORY — DX: Gastro-esophageal reflux disease without esophagitis: K21.9

## 2011-12-17 HISTORY — DX: Dependence on supplemental oxygen: Z99.81

## 2011-12-17 HISTORY — DX: Cytomegaloviral disease, unspecified: B25.9

## 2011-12-17 LAB — CBC WITH DIFFERENTIAL/PLATELET
Eosinophils Absolute: 0 10*3/uL (ref 0.0–0.7)
HCT: 41.6 % (ref 39.0–52.0)
Hemoglobin: 14.6 g/dL (ref 13.0–17.0)
Lymphs Abs: 1.4 10*3/uL (ref 0.7–4.0)
MCH: 31.8 pg (ref 26.0–34.0)
MCV: 90.6 fL (ref 78.0–100.0)
Monocytes Absolute: 1.5 10*3/uL — ABNORMAL HIGH (ref 0.1–1.0)
Monocytes Relative: 16 % — ABNORMAL HIGH (ref 3–12)
Neutrophils Relative %: 69 % (ref 43–77)
RBC: 4.59 MIL/uL (ref 4.22–5.81)

## 2011-12-17 LAB — BASIC METABOLIC PANEL
BUN: 7 mg/dL (ref 6–23)
CO2: 27 mEq/L (ref 19–32)
Chloride: 91 mEq/L — ABNORMAL LOW (ref 96–112)
Glucose, Bld: 92 mg/dL (ref 70–99)
Potassium: 5.3 mEq/L — ABNORMAL HIGH (ref 3.5–5.1)
Sodium: 130 mEq/L — ABNORMAL LOW (ref 135–145)

## 2011-12-17 MED ORDER — PANTOPRAZOLE SODIUM 40 MG PO PACK
40.0000 mg | PACK | Freq: Every day | ORAL | Status: DC
  Administered 2011-12-17 – 2012-01-05 (×20): 40 mg
  Filled 2011-12-17 (×20): qty 20

## 2011-12-17 MED ORDER — GLYCOPYRROLATE 1 MG PO TABS
1.0000 mg | ORAL_TABLET | Freq: Three times a day (TID) | ORAL | Status: DC
  Administered 2011-12-17 – 2011-12-27 (×30): 1 mg
  Filled 2011-12-17 (×39): qty 1

## 2011-12-17 MED ORDER — ACETAMINOPHEN 325 MG PO TABS
650.0000 mg | ORAL_TABLET | Freq: Four times a day (QID) | ORAL | Status: DC | PRN
  Administered 2011-12-17 (×2): 650 mg via ORAL
  Filled 2011-12-17 (×2): qty 2

## 2011-12-17 MED ORDER — ALBUTEROL SULFATE (5 MG/ML) 0.5% IN NEBU
2.5000 mg | INHALATION_SOLUTION | RESPIRATORY_TRACT | Status: DC | PRN
  Administered 2011-12-17 – 2011-12-18 (×2): 2.5 mg via RESPIRATORY_TRACT
  Filled 2011-12-17 (×2): qty 0.5

## 2011-12-17 MED ORDER — DIVALPROEX SODIUM 125 MG PO CPSP
125.0000 mg | ORAL_CAPSULE | Freq: Three times a day (TID) | ORAL | Status: DC
  Administered 2011-12-17 – 2011-12-18 (×3): 125 mg via ORAL
  Filled 2011-12-17 (×6): qty 1

## 2011-12-17 MED ORDER — ENOXAPARIN SODIUM 40 MG/0.4ML ~~LOC~~ SOLN
40.0000 mg | SUBCUTANEOUS | Status: DC
  Administered 2011-12-17 – 2011-12-22 (×6): 40 mg via SUBCUTANEOUS
  Filled 2011-12-17 (×7): qty 0.4

## 2011-12-17 MED ORDER — BACLOFEN 20 MG PO TABS
20.0000 mg | ORAL_TABLET | Freq: Three times a day (TID) | ORAL | Status: DC
  Administered 2011-12-17 – 2012-01-05 (×55): 20 mg
  Filled 2011-12-17 (×64): qty 1

## 2011-12-17 MED ORDER — OSMOLITE 1.2 CAL PO LIQD
1000.0000 mL | ORAL | Status: DC
  Administered 2011-12-17 – 2012-01-04 (×10): 1000 mL
  Filled 2011-12-17 (×21): qty 1000

## 2011-12-17 MED ORDER — FREE WATER
30.0000 mL | Status: DC
  Administered 2011-12-18 – 2012-01-05 (×220): 30 mL

## 2011-12-17 MED ORDER — ADULT MULTIVITAMIN LIQUID CH
5.0000 mL | Freq: Every day | ORAL | Status: DC
  Administered 2011-12-17 – 2012-01-05 (×20): 5 mL
  Filled 2011-12-17 (×20): qty 5

## 2011-12-17 MED ORDER — BIOTENE DRY MOUTH MT LIQD
15.0000 mL | Freq: Two times a day (BID) | OROMUCOSAL | Status: DC
  Administered 2011-12-18 – 2012-01-05 (×37): 15 mL via OROMUCOSAL

## 2011-12-17 MED ORDER — ACETAMINOPHEN 650 MG RE SUPP: 650.0000 mg | Freq: Four times a day (QID) | RECTAL | Status: DC | PRN

## 2011-12-17 MED ORDER — AZITHROMYCIN 500 MG PO TABS
500.0000 mg | ORAL_TABLET | Freq: Every day | ORAL | Status: AC
  Administered 2011-12-17: 500 mg via ORAL
  Filled 2011-12-17: qty 1

## 2011-12-17 MED ORDER — CEFTRIAXONE SODIUM 1 G IJ SOLR
1.0000 g | Freq: Once | INTRAMUSCULAR | Status: AC
  Administered 2011-12-17: 1 g via INTRAMUSCULAR

## 2011-12-17 MED ORDER — GABAPENTIN 300 MG PO CAPS
300.0000 mg | ORAL_CAPSULE | Freq: Three times a day (TID) | ORAL | Status: DC
  Administered 2011-12-17 – 2011-12-28 (×33): 300 mg
  Filled 2011-12-17 (×35): qty 1

## 2011-12-17 MED ORDER — OMEPRAZOLE 2 MG/ML ORAL SUSPENSION: 20.0000 mg | Freq: Every day | ORAL | Status: DC

## 2011-12-17 MED ORDER — FREE WATER
60.0000 mL | Status: DC
  Administered 2011-12-17 (×3): 60 mL

## 2011-12-17 MED ORDER — AZITHROMYCIN 250 MG PO TABS
250.0000 mg | ORAL_TABLET | Freq: Every day | ORAL | Status: DC
  Filled 2011-12-17: qty 1

## 2011-12-17 MED ORDER — GERHARDT'S BUTT CREAM
1.0000 "application " | TOPICAL_CREAM | Freq: Two times a day (BID) | CUTANEOUS | Status: DC
  Administered 2011-12-17 – 2012-01-05 (×37): 1 via TOPICAL
  Filled 2011-12-17 (×5): qty 1

## 2011-12-17 MED ORDER — ALBUTEROL SULFATE (2.5 MG/3ML) 0.083% IN NEBU
2.5000 mg | INHALATION_SOLUTION | Freq: Once | RESPIRATORY_TRACT | Status: AC
  Administered 2011-12-17: 2.5 mg via RESPIRATORY_TRACT

## 2011-12-17 NOTE — Progress Notes (Signed)
INITIAL ADULT NUTRITION ASSESSMENT Date: 12/17/2011   Time: 2:31 PM  Reason for Assessment: MD Consult for Initiation and Management of Enteral Nutrition  INTERVENTION: 1. Continue home regimen of Osmolite 1.2 at 37 ml/hr with 60 ml (2 oz) flushes q 2 hours. This provides: 1065 kcal, 50 grams protein, 728 ml free water, 720 ml additional water daily (total of 1448 ml water daily.)  2. Liquid MVI daily 3. Recommend referral to alternative company for management of enteral nutrition if able; recommend option of [facility] -- discussed with Case Manager Darlyne Russian 4. RD to continue to follow nutrition care plan  DOCUMENTATION CODES Per approved criteria  -Not Applicable   ASSESSMENT: Male 21 y.o.  Dx: SOB  Hx:  Past Medical History  Diagnosis Date  . Allergy   . Asthma   . Cerebral palsy, quadriplegic   . Congenital CMV infection   . Pneumonia   . Seizures   . Neuromuscular disorder     cp  . Community acquired pneumonia 06/30/2011    Psuedomonas PNA 06/2011 >tx 21 d of Cipro  CXR 08/15/11 >clearance of PNA     Past Surgical History  Procedure Date  . Nissen fundoplication   . Rod    Related Meds:     . azithromycin  250 mg Oral Daily  . azithromycin  500 mg Oral Daily  . baclofen  20 mg Per Tube TID WC  . divalproex  125 mg Oral Q8H  . enoxaparin (LOVENOX) injection  40 mg Subcutaneous Q24H  . gabapentin  300 mg Per Tube TID  . Gerhardt's butt cream  1 application Topical BID  . glycopyrrolate  1 mg Per Tube TID  . pantoprazole sodium  40 mg Per Tube Daily  . DISCONTD: omeprazole  20 mg Oral Q1200   Ht:  4\' 10"  (147.3 cm)  Wt:  95 lb (43.1 kg)  Ideal Wt:    39.6 kg (adjusted for quadriplegia) % Ideal Wt: 109%  Wt Readings from Last 15 Encounters:  11/04/11 95 lb (43.092 kg)  07/08/11 93 lb 11.1 oz (42.5 kg)  Usual Wt: 95 - 100 lb % Usual Wt: 100%  BMI 19.9 - WNL  Food/Nutrition Related Hx: pt requires enteral nutrition at baseline  Labs:    CMP     Component Value Date/Time   NA 130* 12/17/2011 1315   K 5.3* 12/17/2011 1315   CL 91* 12/17/2011 1315   CO2 27 12/17/2011 1315   GLUCOSE 92 12/17/2011 1315   BUN 7 12/17/2011 1315   CREATININE 0.23* 12/17/2011 1315   CALCIUM 9.3 12/17/2011 1315   GFRNONAA >90 12/17/2011 1315   GFRAA >90 12/17/2011 1315   No results found for this basename: phos   No results found for this basename: prealbumin   No results found for this basename: mg   Intake/Output: No intake or output data in the 24 hours ending 12/17/11 1436  Diet Order:   no diet order - RD to clarify  Supplements/Tube Feeding: home regimen is Osmolite 1.2 at 37 ml/hr, 2 oz flushes every 2 hr  IVF:    Estimated Nutritional Needs:   Kcal:  1000 - 1200 kcal Protein:  36 - 45 grams protein Fluid:  1.2 - 1.4 liters daily  Past medical history of CP/MR. Uses g-tube for feedings at baseline.   Current order is Osmolite 1.2 at 37 ml/hr with 60 ml flushes q 2 hours. This provides 1065 kcal, 50 grams protein, 728 ml  free water, 720 ml additional water daily. Mom reports that pt receives 60 ml water flushes q 2 hours at baseline and would like for him to continue that regimen here.   Discussed possibility of using liquid MVI with mom. She states that to her knowledge pt has never received a MVI. Pt's current TF regimen does not provide 100% RDI's. Will initiate liquid MVI at this time.  Discussed stooling pattern for patient. Mom notes that pt typically has 1 stool a week. Mom reports that it has been at least 1 week since pt has had a BM. Discussed option of using fiber-containing formula, such as Jevity 1.2 to help with frequency of stools. Mom aware of fiber-containing formulas but would prefer to trial formula at outpatient.  Also discussed home care agency for enteral nutrition. Noted that mom is not satisfied with current company and is agreeable to trying [facility] for management of enteral nutrition. Pt  currently receives O2 from [facility]. Discussed this with Case Manager Annette.  NUTRITION DIAGNOSIS: Inadequate oral intake r/t inability to eat AEB NPO status, need for G-tube for alternate source of nutrition.  MONITORING/EVALUATION(Goals): Goal: EN to meet >90% of estimated needs.  Monitor: TF tolerance, weights, labs  EDUCATION NEEDS: -No education needs identified at this time  Jarold Motto MS, RD, LDN Pager: [phone] After-hours pager: [phone]

## 2011-12-17 NOTE — Progress Notes (Signed)
PGY 1 Update: Pt became febrile overnight despite ABx treatment of Azithromycin and Rocephin today.  Due to his underlying Co-Morbidities including CP-MR, will transfer him to SDU for closer follow up.  We will also switch him to Zosyn and Azithromycin for treatment.  Will get repeat CXR as well but will not get BCx as pt has already received ABx today.  Twana First Paulina Fusi, DO of [facility] 12/17/2011, 10:28 PM

## 2011-12-17 NOTE — Progress Notes (Signed)
Patient ID: [name], male   DOB: 1990-11-06, 21 y.o.   MRN: [id number]  Seen and examined patient upon arrival to medical floor.  Mother at bedside and is a great historian.  Briefly, this patient was directly admitted from our clinic for increasing WOB, productive cough, and O2 sat 87% on RA.  He received albuterol treatment x 1 in our clinic without relief.  Mother says "when Mycal becomes ill, he can go from looking great to looking really bad very quickly."  He has had pneumonia in the past.  He is followed by [facility] and received his flu shot there last month.  Patient also has a hx of seizures.  He had about 5 episodes today while in clinic (lasted less than one minute).  Described as "he turns his neck and head to one side and both arms become stiff."  He takes Depakote per G-tube three times per day.  On exam, patient is sleeping comfortably.  Heart: RRR, no murmur appreciated.  Lungs: coarse BS anteriorly, no wheezes, rales, or rhonchi.  Plan: - Follow up CXR 2-view to rule out infectious process  - Continue Azithromycin and CTX for possible CAP - Maintain O2 saturation > 92% - Consult Nutrition for G-tube feedings - Continue to respiratory status closely  DE LA CRUZ,Demetress Tift PGY-3  12/17/11 @ 2:26 PM

## 2011-12-17 NOTE — Assessment & Plan Note (Signed)
Admit to inpatient.

## 2011-12-17 NOTE — H&P (Signed)
Family Medicine Teaching Service  Hospital Admission History and Physical  Patient name: [name] Medical record number: [id number]  Date of birth: 01/30/1991 Age: 21 y.o. Gender: male  Primary Care Provider: OH PARK, ANGELA, MD  Assessment and Plan:  [name] is a 21 y.o. year old male with cerebral palsy, mental retardation, and seizure disorder presenting with increased work of breathing and hypoxemia.  #. Hypoxemia - Patient with likely respiratory infection, specifically community acquired pneumornia. He was given albuterol nebulizer treatment x 1 in the office, and he remained hypoxemia to 87% on room air.  - s/p CTX 1 gm IM x 1 in clinc, start azithromycin 500 mg today and 250 mg for 4 following days  - obtain chest X-ray, CBC, BMET  - Albuterol PRN  - continue glycopyrrolate daily  - O2 support  #. Seizure Disorder - Increasing frequency likely secondary to acute illness.  - Continue depakote at 3 caps per home regimen  #. Cerebral Palsy - continue home dose of baclofen, diazepam, and gabapentin  # Obstructive Sleep Apnea - Continue night time O2  #. FENGI - Patient needs a nutrition consultation for tube feeds; continue home omeprazole  #. PPX - levenox for DVT ppx  #. Disposition - Admit to Family Medicine Teaching Service on Med Surg Floor under Dr. Hale  # Code - Limited. DNI.   Chief Complaint: Increased work of breathing   History of Present Illness: [name] is a 21 y.o. year old male with cerebral palsy, mental retardation, and seizure disorder presenting with increased work of breathing and fever. This started a a dry cough yesterday. He was noted to be febrile last night to 101. He was given motrin x 2, last time at 6AM this morning. His cough has now become productive, and his mother is concerned about a respiratory infection. She has not given him albuterol since last week, but believes that he currently needs it. His O2 saturation was 87% upon arrival  and did not improve with albuterol. According to his mother his saturation is in the 90's at well visits. He was given 1 gm of ceftriaxone IM in the office, but needs admission for his hypoxemia.  Patient Active Problem List   Diagnosis   .  MENTAL RETARDATION   .  SLEEP APNEA, OBSTRUCTIVE   .  CEREBRAL PALSY   .  CORTICAL BLINDNESS   .  HEARING IMPAIRMENT   .  CONGENITAL CYTOMEGALOVIRUS INFECTION   .  SEIZURE DISORDER   .  WHEELCHAIR DEPENDENCE   .  Dysphagia    Past Medical History:  Past Medical History   Diagnosis  Date   .  Allergy    .  Asthma    .  Cerebral palsy, quadriplegic    .  Congenital CMV infection    .  Pneumonia    .  Seizures    .  Neuromuscular disorder      cp   .  Community acquired pneumonia  06/30/2011     Psuedomonas PNA 06/2011 >tx 21 d of Cipro CXR 08/15/11 >clearance of PNA    Past Surgical History:  Past Surgical History   Procedure  Date   .  Nissen fundoplication    .  Rod     Social History:  History    Social History   .  Marital Status:  Single     Spouse Name:  N/A     Number of Children:    N/A   .  Years of Education:  N/A    Social History Main Topics   .  Smoking status:  Never Smoker   .  Smokeless tobacco:  Never Used   .  Alcohol Use:  No   .  Drug Use:  No   .  Sexually Active:  No    Other Topics  Concern   .  Not on file    Social History Narrative   .  No narrative on file    Family History:  Family History   Problem  Relation  Age of Onset   .  Allergies  Mother    .  Heart disease  Maternal Uncle    .  Colon cancer  Paternal Grandmother     Allergies:  Allergies   Allergen  Reactions   .  Sulfamethoxazole W-Trimethoprim     Current Outpatient Prescriptions   Medication  Sig  Dispense  Refill   .  albuterol (PROVENTIL) (2.5 MG/3ML) 0.083% nebulizer solution  Take 2.5 mg by nebulization every 6 (six) hours as needed. Shortness of breath     .  artificial tears (LACRILUBE) OINT ophthalmic ointment  Apply to  eye every 4 (four) hours as needed. Please provide bottle used in hospital     .  baclofen (LIORESAL) 10 MG tablet  TAKE 2 TABLETS INTO FEEDING TUBE 3 TIMES A DAY  180 tablet  11   .  diazepam (DIASTAT) 2.5 MG GEL  Unknown dose. Prescribed by Dr. Hickling, neurology     .  divalproex (DEPAKOTE SPRINKLE) 125 MG capsule  3 caps per tube at 6 am, 2 caps per tube at 2 pm, 3 caps per tube at 9 pm     .  Feeding Tubes - Pump (KANGAROO JOEY ENTERAL PUMP) MISC  30 Units by Does not apply route daily. Please provide bags for Kangroo Joey enteral feeding device. Should be changed daily.  30 each  12   .  gabapentin (NEURONTIN) 300 MG capsule  1 tab per tube three times a day     .  glycopyrrolate (ROBINUL) 1 MG tablet  1 TAB PER TUBE THREE TIMES A DAY FOR LUNG SECRETIONS  90 tablet  11   .  Hydrocortisone (GERHARDT'S BUTT CREAM) CREA  Apply 1 application topically 2 (two) times daily. Please provide remained of tube used in hospital  1 each  2   .  omeprazole (PRILOSEC) 2 mg/mL SUSP  Take 10 mLs (20 mg total) by mouth daily at 12 noon.  150 mL  3   .  PROVENTIL HFA 108 (90 BASE) MCG/ACT inhaler  USE 2 PUFFS EVERY 4 HOURS AS NEEDED FOR WHEEZE  1 Inhaler  0    Current Facility-Administered Medications   Medication  Dose  Route  Frequency  Provider  Last Rate  Last Dose   .  albuterol (PROVENTIL) (2.5 MG/3ML) 0.083% nebulizer solution 2.5 mg  2.5 mg  Nebulization  Once  Maeci Kalbfleisch V Layman Gully, MD   2.5 mg at 12/17/11 1101    Review Of Systems: Per HPI with the following additions: none  Otherwise 12 point review of systems was performed and was unremarkable.  Physical Exam:  BP 104/68  Pulse 122  Temp 97.8 F (36.6 C) (Axillary)  SpO2 87%  General: alert, mild distress and wheelchair bound, non communicative  HEENT: plagiocephalic, disconjugate gaze, poor dentition, OP with anatomical obstruction, no submandibular or submental lymphadenopathy  Heart: S1, S2

## 2011-12-17 NOTE — Progress Notes (Signed)
  Subjective:    Patient ID: JEANCLAUDE WENTWORTH, male    DOB: 1990-06-05, 21 y.o.   MRN: [id number]  HPI  21 year old M with cerebral palsy, mental retardation and seizure disorder who presents with his mother for evaluation of a cough and fever. All of the HPI is given by the mother since the patient cannot speak. These symptoms started yesterday. The cough was initially dry and now is productive. He is have difficulty with respiration according to his mother. He is usually given albuterol nebulizer treatments when this occurs. He has also had a fever, measure to 101 at home yesterday. He has been given motrin x 2, most recently at 6AM. His mother also notes an increasing seizure frequency in the last 24 hours. He has had 2 since in the clinic today.   PMH: hospitalized with pneumonia in May 2013 Soc: spends time at daycare, lives at home with parents   Review of Systems Patient cannot perform 2/2 to baseline medical problems    Objective:   Physical Exam BP 104/68  Pulse 122  Temp 97.8 F (36.6 C) (Axillary)  SpO2 87% Gen: ill appearing, wheelchair bound, minimally interactive HEENT: poor dentition,  CV: sinus tachycardia Pulm: increased WOB with subcostal retractions, decreased breath sounds in LLL, diffuse rhonchi without wheezes or rales        Assessment & Plan:  Patient given albuterol 2.5 mg nebulizer treatment.    FMTS Attending Admit Note  Patient seen and examined by me with admitting resident Dr Clinton Sawyer, discussed plan of care with him and with patient's mother.  Briefly, Johnryan is a 21yoM with cerebral palsy and history of rapidly progressing pneumonia (last hospitalization May 2013), who is brought in today for fevers/chills in the past 2 days (temp last night 101F), and this morning presenting with cough and apparent difficulty breathing.  His POx in the office on room air in the mid-80s, reportedly in the mid-low 90s on room air at baseline.  Received Motrin at home  this mornnig before coming to office.   Assess/Plan Concern for possible CAP and history of rapid progression.  Plan for direct admit/observation, empiric therapy with Ceftriaxone and azithro while we await CXR and CBC results.  Also supplemental oxygen and albuterol, pulmonary toilet.  Mother is clear in her wishes for all measures short of intubation for [place], has reiterated these and has a completed MOST form in the office (signed by me from his May 2013 hospitalization).  Still in effect with no changes.   Paula Compton, MD

## 2011-12-17 NOTE — Progress Notes (Signed)
Patient ID: HOBART MARTE, male DOB: 10-Jan-1991, 21 y.o. MRN: [id number]   Brief Progress Note:  S: Mother at bedside and concerned that the patient is continuing to have increased work of breathing and is continuously moaning. These are both atypical for him.   O: BP 106/49  Pulse 123  Temp 101.3 F (38.5 C) (Axillary)  Resp 20  Ht 4' 9.87" (1.47 m)  Wt 94 lb (42.638 kg)  BMI 19.73 kg/m2  SpO2 95% Gen: ill appearing, distressed, bed bound at baseline, non communicative  CV: sinus tachycardia Pulm: increased work of breathing with supraclavicular retractions; decreased breath sounds in LLL, diffuse rhonchi, no wheezes  A/P: DONOVYN GUIDICE is a 21 y.o. year old male with cerebral palsy, mental retardation, and seizure disorder presenting with increased work of breathing and hypoxemia.  - patient worsening clinical condition after CTX 1 gm and Azithromycin 500 mg today as well as O2 support, albuterol nebs and tylenol; concern for aspiration pneumonia:  - broaden coverage to Zosyn to cover anaerobes and pseudomonas,  - repeat CXR - transfer to step down and start continuous pulse ox    Si Raider. Clinton Sawyer, MD, MBA 12/17/2011, 10:37 PM Family Medicine Resident, PGY-2 [phone] pager

## 2011-12-18 ENCOUNTER — Inpatient Hospital Stay (HOSPITAL_COMMUNITY): Payer: BC Managed Care – PPO

## 2011-12-18 ENCOUNTER — Encounter: Payer: Self-pay | Admitting: Emergency Medicine

## 2011-12-18 LAB — CBC WITH DIFFERENTIAL/PLATELET
Eosinophils Absolute: 0 10*3/uL (ref 0.0–0.7)
Eosinophils Relative: 0 % (ref 0–5)
HCT: 42.7 % (ref 39.0–52.0)
Hemoglobin: 14.2 g/dL (ref 13.0–17.0)
Lymphocytes Relative: 32 % (ref 12–46)
Lymphs Abs: 2.9 10*3/uL (ref 0.7–4.0)
MCH: 30.9 pg (ref 26.0–34.0)
MCV: 93 fL (ref 78.0–100.0)
Monocytes Absolute: 1.3 10*3/uL — ABNORMAL HIGH (ref 0.1–1.0)
Monocytes Relative: 14 % — ABNORMAL HIGH (ref 3–12)
RBC: 4.59 MIL/uL (ref 4.22–5.81)
WBC: 9 10*3/uL (ref 4.0–10.5)

## 2011-12-18 MED ORDER — NYSTATIN 100000 UNIT/GM EX CREA
TOPICAL_CREAM | Freq: Two times a day (BID) | CUTANEOUS | Status: DC | PRN
  Administered 2011-12-27 – 2011-12-28 (×2): via TOPICAL

## 2011-12-18 MED ORDER — DIVALPROEX SODIUM 125 MG PO CPSP
250.0000 mg | ORAL_CAPSULE | Freq: Every day | ORAL | Status: DC
  Administered 2011-12-18 – 2012-01-05 (×19): 250 mg via ORAL
  Filled 2011-12-18 (×19): qty 2

## 2011-12-18 MED ORDER — DIVALPROEX SODIUM 125 MG PO CPSP
375.0000 mg | ORAL_CAPSULE | Freq: Two times a day (BID) | ORAL | Status: DC
  Administered 2011-12-18 – 2012-01-05 (×36): 375 mg via ORAL
  Filled 2011-12-18 (×39): qty 3

## 2011-12-18 MED ORDER — LEVOFLOXACIN IN D5W 750 MG/150ML IV SOLN
750.0000 mg | INTRAVENOUS | Status: DC
  Administered 2011-12-18: 750 mg via INTRAVENOUS
  Filled 2011-12-18 (×2): qty 150

## 2011-12-18 MED ORDER — PIPERACILLIN-TAZOBACTAM 3.375 G IVPB
3.3750 g | Freq: Three times a day (TID) | INTRAVENOUS | Status: DC
  Administered 2011-12-18 – 2011-12-19 (×5): 3.375 g via INTRAVENOUS
  Filled 2011-12-18 (×7): qty 50

## 2011-12-18 MED ORDER — ALBUTEROL SULFATE (5 MG/ML) 0.5% IN NEBU
2.5000 mg | INHALATION_SOLUTION | Freq: Four times a day (QID) | RESPIRATORY_TRACT | Status: DC
  Administered 2011-12-18 – 2011-12-20 (×8): 2.5 mg via RESPIRATORY_TRACT
  Filled 2011-12-18 (×8): qty 0.5

## 2011-12-18 MED ORDER — ALBUTEROL SULFATE (5 MG/ML) 0.5% IN NEBU
2.5000 mg | INHALATION_SOLUTION | RESPIRATORY_TRACT | Status: DC | PRN
  Administered 2011-12-19 – 2011-12-20 (×2): 2.5 mg via RESPIRATORY_TRACT
  Filled 2011-12-18 (×2): qty 0.5

## 2011-12-18 NOTE — Discharge Summary (Signed)
Physician Discharge Summary  Patient ID: [name] MRN: [id number] DOB: 1990/06/15 Age: 21 y.o.  Admit date: 12/17/2011 Discharge date: 01/05/2012 Admitting Physician: Tobin Chad, MD  PCP: Priscella Mann, MD  Consultants:Pulmonology, Palliative Care    Discharge Diagnosis: Principal Problem: 1. Community acquired pneumonia Active Problems:  2. MENTAL RETARDATION  3. SLEEP APNEA, OBSTRUCTIVE  4. CEREBRAL PALSY  5. Acute and chronic respiratory failure  6. Seizure disorder   Hospital Course [name] is a 21 y.o. year old male with cerebral palsy, mental retardation, and seizure disorder with persistent hypoxemia/acute respiratory failure secondary to viral vs. bacterial pneumonia, s/p antibiotics and receiving supportive care, with lengthy duration of symptoms.    1) Community-acquired pneumonia - Pt sent from clinic due to hypoxemia despite one nebulizer treatment.  He was also given a dose of Rocephin and Azithromycin for the possibility of HCAP.  On admission, started on O2 2 L, albuterol PRN, and a CXR, CBC, and BMET were ordered.  The CXR showed possible LLL PNA and the CBC showed a WBC of 9.5.  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  At this point, he did have one fever to 101.45F and was switched to Zosyn and Azithromycin to cover for Pseudomonas as well as Anaerobic coverage from possible aspiration.  Pt slowly started to improve and his work of breathing decreased, his saturations remained above 92% on his normal O2 requirement, and he was switched to Levaquin from the Azithromycin and zosyn discontinued.  However, worsening oxygen saturations required non-rebreather mask.  His outpatient pulmonologist, Dr. Delton Coombes, was consulted and agreed that this was most likely viral with some mucous plugging and likely poor prognosis due to  baseline low pulmonary reserve (OSA, MR-CP not taking deep breaths).  Due to continued requirements for a non-rebreather mask, pt was kept in the stepdown unit.  With poor pulmonary reserve, transition to comfort care was considered with his mother and she was on board with LTAC placement. Palliative Care recommended scheduled tylenol for pain control, scheduled q4hour respiratory therapy that helped with the copious secretions, Kanair bed to help with pressure sores, and clarified code status to include DNI, NO chest compressions or defibrillation with ACLS, YES to medications for BP and arrhythmias; UNDECIDED re: pacemakers or cardioversion.  Pt received guaifenesin for secretions and xopenex for SOB.  He was needing less O2 by venturi mask and so trialled O2 by [place], which patient tolerated.  By discharge, pt was not requiring supplemental oxygen and O2 saturations 94-96 % on room air with occasional dip into upper 80s when was sleeping.  Though initially hoped for LTAC at [facility], [employer] did not approve this and mother preferred patient to go home where she could give him O2 he needed and help with his chest PT.  Palliative Care and CM were consulted to help organize for patient to have home health services in this patient needing regular RT/chest PT, suctioning, seizure management, PEG tube management if malfunction, and O2 administration to maintain O2 sat.  Discharged with DME for O2 with mask and follow-up scheduled with PCP and Dr Delton Coombes, pulmonologist.  Provided mom with note explaining his intensity of care needed this hospitalization.  2) Seizure Disorder - Continued depakote home dosing. Valproic Acid level 70 on admission.  Stable with exception of one 1-minute seizure on 11/6 when patient's PEG tube  had become clogged.  Patient's medications delayed, including antiepileptics.  This in addition to lowered seizure threshold during illness likely contributed to the seizure.  PR diazepam added PRN generalized  seizure for >2 minutes, though patient did not need this and was stable remainder of hospitalization.  3) Cerebral Palsy - Continued home dose of baclofen, diazepam, and gabapentin.  Stable.  PEG tube feeds and medication continued through hospitalization though PEG did become clogged and needed revision once.  Palliative care consulted and clarified with mom patient's code status (see above).  4) OSA - Continued home O2 at night. Stable, with occasional desaturations while asleep.  Mom did not want BiPAP or CPAP at this time.   5) Tachycardia - On 11/6, patient had tachycardia to 140-160s throughout the day, likely due to aggravation with clogged PEG tube and manipulation throughout the day for that.  Stat EKG showed sinus tachycardia.  Other vital signs were stable.  Monitored for signs of sepsis which patient did not have.  If tachycardia persists after discharge, can consider ABG and if patient hypercarbic, CPAP/BiPAP short-term may treat this, though patient may have hard time tolerating and mom did not want at this time.  Tachycardia had resolved by discharge.  6) Loose stools - Patient had a few episodes of diarrhea, likely due to antibiotics.  Negative c difficile PCR, and resolved by 11/7.  Buttocks red and irritated, likely moisture damage vs candidiasis.  Pt received kinair bed and barrier cream daily, as well as nystatin cream, and dosed ketoconazole 100mg  PO x 1 per wound care recommendations; Redness had improved greatly by discharge.  Discharge Day PE   Filed Vitals:   01/05/12 1218  BP: 109/72  Pulse: 104  Temp: 97.9 F (36.6 C)  Resp: 16  O2 saturation 96% on room air with occasional drop when sleeping to upper 80s  General: Lying in bed, awake, NAD  HEENT: Normocephalic. Lots of upper airway/nasal sounds, MMM, EOM with left eye drifting to left but otherwise movements in tact; sclera clear, poor dentition Heart: Regular rate and rhythm, no murmurs, rubs, gallops heard    Lungs: Right lung with decreased breath sounds, but overall poor effort, shallow breaths, no oxygen administered, loud upper airway breath sounds transmitted into lungs  Abdomen: gastric tube in left periumbilical area, well healed midline incision, soft, nondistended, does not appear tender on exam  Extremities: Contractured, atrophied arms and legs  Skin:no rashes, no wounds  Neuro: Awake, eyes looking left to right but not tracking examiner, pt does not seem to know examiner present, grunts and smiles occasionally   Procedures/Imaging:  Dg Chest 2 View  12/17/2011    IMPRESSION: 1.  Ill-defined left lower lobe opacity concerning for early bronchopneumonia or sequelae of mild aspiration. 2.  Trace left pleural effusion.   Original Report Authenticated By: Florencia Reasons, M.D.    Dg Chest Port 1 View  12/18/2011   IMPRESSION: Opacity at the left lung base medially suspicious for atelectasis and/or pneumonia.  Consider two-view chest x-ray if possible.   Original Report Authenticated By: Juline Patch, M.D.     Labs  CBC No results found for this basename: WBC:3,HGB:3,HCT:3,PLT:3 in the last 168 hours BMET  Lab 12/31/11 0555  NA --  K --  CL --  CO2 --  BUN --  CREATININE 0.20*  CALCIUM --  PROT --  BILITOT --  ALKPHOS --  ALT --  AST --  GLUCOSE --   CBC  Component Value Date/Time   WBC 10.3 12/28/2011 0500   RBC 4.54 12/28/2011 0500   HGB 14.2 12/28/2011 0500   HCT 42.1 12/28/2011 0500   PLT 407* 12/28/2011 0500   MCV 92.7 12/28/2011 0500   MCH 31.3 12/28/2011 0500   MCHC 33.7 12/28/2011 0500   RDW 13.5 12/28/2011 0500   LYMPHSABS 2.2 12/24/2011 0845   MONOABS 1.4* 12/24/2011 0845   EOSABS 0.1 12/24/2011 0845   BASOSABS 0.0 12/24/2011 0845   C difficile negative   Patient condition at time of discharge/disposition: Improved, not requiring O2.  Disposition- Home with mother   Discharge follow up:  Discharge Orders    Future Appointments: Provider:  Department: Dept Phone: Center:   01/09/2012 11:15 AM Shelly Rubenstein, MD [facility] [phone] [facility]   01/15/2012 1:30 PM Leslye Peer, MD [facility] [phone] None     Future Orders Please Complete By Expires   For home use only DME oxygen      Comments:   O2 PRN by mask or nasal cannula.  Patient needs mask in case does not tolerate [place].   Questions: Responses:   Mode or (Route) Mask   Liters per Minute 2   Frequency    Oxygen conserving device      Follow-up Information    Call [facility] PARK, ANGELA, MD. (Call to make an appointment within the next week)    Contact information:   [address] [place] [address] [phone]          Follow up issues: 1. Follow-up home oxygen needs - will likely continue to need PRN 2. Patient needing regular RT/chest PT, suctioning, seizure management, PEG tube management if malfunction, and will likely need PRN O2 administration to maintain O2 sat - home with mask and rx for another mask   Discharge Instructions: Please refer to Patient Instructions section of EMR for full details.  Patient was counseled important signs and symptoms that should prompt return to medical care, changes in medications, dietary instructions, activity restrictions, and follow up appointments.  Significant instructions noted below:   Discharge Medications   Medication List     As of 01/05/2012 12:21 PM    START taking these medications         acetaminophen 160 MG/5ML solution   Commonly known as: TYLENOL   Take 10.2 mLs (325 mg total) by mouth every 6 (six) hours as needed (fever>101).      levalbuterol 0.63 MG/3ML nebulizer solution   Commonly known as: XOPENEX   Take 3 mLs (0.63 mg total) by nebulization every 2 (two) hours as needed for wheezing or shortness of breath.      nystatin cream   Commonly known as: MYCOSTATIN   Apply topically 2 (two) times daily as needed (Redness).      CHANGE how  you take these medications         diazepam 2.5 MG Gel   Commonly known as: DIASTAT   Place 10 mg rectally as needed. If [name] has a continuous generalized seizure > 2 minutes long.   What changed: - dose - route (how to take the med) - how often to take the med - reasons to take the med - doctor's instructions      CONTINUE taking these medications         artificial tears Oint ophthalmic ointment   Apply to eye every 4 (four) hours as needed. Please provide bottle used  in hospital      baclofen 10 MG tablet   Commonly known as: LIORESAL   TAKE 2 TABLETS INTO FEEDING TUBE 3 TIMES A DAY      divalproex 125 MG capsule   Commonly known as: DEPAKOTE SPRINKLE      gabapentin 300 MG capsule   Commonly known as: NEURONTIN      Gerhardt's butt cream Crea   Apply 1 application topically 2 (two) times daily. Please provide remained of tube used in hospital      glycopyrrolate 1 MG tablet   Commonly known as: ROBINUL   1 TAB PER TUBE THREE TIMES A DAY FOR LUNG SECRETIONS      KANGAROO JOEY ENTERAL PUMP Misc   30 Units by Does not apply route daily. Please provide bags for Kangroo Joey enteral feeding device. Should be changed daily.      omeprazole 2 mg/mL Susp   Commonly known as: PRILOSEC   Take 10 mLs (20 mg total) by mouth daily at 12 noon.      PROVENTIL HFA 108 (90 BASE) MCG/ACT inhaler   Generic drug: albuterol   USE 2 PUFFS EVERY 4 HOURS AS NEEDED FOR WHEEZE      STOP taking these medications         albuterol (2.5 MG/3ML) 0.083% nebulizer solution   Commonly known as: PROVENTIL          Where to get your medications    These are the prescriptions that you need to pick up. We sent them to a specific pharmacy, so you will need to go there to get them.   [id number] [place],  - [address]    [address] [place] [address]    Phone: [phone]        acetaminophen 160 MG/5ML solution   levalbuterol 0.63 MG/3ML nebulizer solution    nystatin cream         You may get these medications from any pharmacy.         diazepam 2.5 MG Gel            Simone Curia, MD of [facility] 01/05/2012 12:21 PM

## 2011-12-18 NOTE — Progress Notes (Signed)
PGY-1 Daily Progress Note Family Medicine Teaching Service Honaker R. Amalee Olsen, DO Service Pager: [phone]   Subjective: Per mom, pt seems to be doing better, he is not working as hard to breath right now.  No fevers since 2 AM.   Objective:  VITALS Temp:  [97.4 F (36.3 C)-101.3 F (38.5 C)] 98.6 F (37 C) (10/24 0740) Pulse Rate:  [96-130] 113  (10/24 0800) Resp:  [13-30] 23  (10/24 0800) BP: (92-123)/(49-68) 101/56 mmHg (10/24 0800) SpO2:  [87 %-98 %] 95 % (10/24 0918) Weight:  [94 lb (42.638 kg)] 94 lb (42.638 kg) (10/23 2022)  In/Out  Intake/Output Summary (Last 24 hours) at 12/18/11 0944 Last data filed at 12/18/11 0800  Gross per 24 hour  Intake  395.5 ml  Output      3 ml  Net  392.5 ml    Physical Exam: General: NAD  HEENT: poor dentition, OP with anatomical obstruction, no submandibular or submental lymphadenopathy  Heart: S1, S2 normal, no murmur, rub or gallop, regular rate and rhythm  Lungs: increased work of breathing, decreased breath sounds in LLL, rhonchi throughout lungs Abdomen: gastric tube in left periumbilical area, well healed midline incision  Extremities: atrophied with contractures  Skin:no rashes, no wounds    MEDS Scheduled Meds:    . antiseptic oral rinse  15 mL Mouth Rinse BID  . azithromycin  250 mg Oral Daily  . azithromycin  500 mg Oral Daily  . baclofen  20 mg Per Tube TID WC  . divalproex  250 mg Oral Daily  . divalproex  375 mg Oral BID  . enoxaparin (LOVENOX) injection  40 mg Subcutaneous Q24H  . free water  30 mL Per Tube Q2H  . gabapentin  300 mg Per Tube TID  . Gerhardt's butt cream  1 application Topical BID  . glycopyrrolate  1 mg Per Tube TID  . multivitamin  5 mL Per Tube Daily  . pantoprazole sodium  40 mg Per Tube Daily  . piperacillin-tazobactam (ZOSYN)  IV  3.375 g Intravenous Q8H  . DISCONTD: divalproex  125 mg Oral Q8H  . DISCONTD: free water  60 mL Per Tube Q2H  . DISCONTD: omeprazole  20 mg Oral Q1200    Continuous Infusions:    . feeding supplement (OSMOLITE 1.2 CAL) 1,000 mL (12/17/11 1905)   PRN Meds:.acetaminophen, acetaminophen, albuterol  Labs and imaging:   CBC  Lab 12/17/11 1315  WBC 9.5  HGB 14.6  HCT 41.6  PLT 151   BMET/CMET  Lab 12/17/11 1315  NA 130*  K 5.3*  CL 91*  CO2 27  BUN 7  CREATININE 0.23*  CALCIUM 9.3  PROT --  BILITOT --  ALKPHOS --  ALT --  AST --  GLUCOSE 92   Results for orders placed during the hospital encounter of 12/17/11 (from the past 24 hour(s))  BASIC METABOLIC PANEL     Status: Abnormal   Collection Time   12/17/11  1:15 PM      Component Value Range   Sodium 130 (*) 135 - 145 mEq/L   Potassium 5.3 (*) 3.5 - 5.1 mEq/L   Chloride 91 (*) 96 - 112 mEq/L   CO2 27  19 - 32 mEq/L   Glucose, Bld 92  70 - 99 mg/dL   BUN 7  6 - 23 mg/dL   Creatinine, Ser 6.44 (*) 0.50 - 1.35 mg/dL   Calcium 9.3  8.4 - 03.4 mg/dL   GFR calc non Af  Amer >90  >90 mL/min   GFR calc Af Amer >90  >90 mL/min  CBC WITH DIFFERENTIAL     Status: Abnormal   Collection Time   12/17/11  1:15 PM      Component Value Range   WBC 9.5  4.0 - 10.5 K/uL   RBC 4.59  4.22 - 5.81 MIL/uL   Hemoglobin 14.6  13.0 - 17.0 g/dL   HCT 40.9  81.1 - 91.4 %   MCV 90.6  78.0 - 100.0 fL   MCH 31.8  26.0 - 34.0 pg   MCHC 35.1  30.0 - 36.0 g/dL   RDW 78.2  95.6 - 21.3 %   Platelets 151  150 - 400 K/uL   Neutrophils Relative 69  43 - 77 %   Neutro Abs 6.6  1.7 - 7.7 K/uL   Lymphocytes Relative 15  12 - 46 %   Lymphs Abs 1.4  0.7 - 4.0 K/uL   Monocytes Relative 16 (*) 3 - 12 %   Monocytes Absolute 1.5 (*) 0.1 - 1.0 K/uL   Eosinophils Relative 0  0 - 5 %   Eosinophils Absolute 0.0  0.0 - 0.7 K/uL   Basophils Relative 0  0 - 1 %   Basophils Absolute 0.0  0.0 - 0.1 K/uL  MRSA PCR SCREENING     Status: Normal   Collection Time   12/17/11 11:45 PM      Component Value Range   MRSA by PCR NEGATIVE  NEGATIVE  VALPROIC ACID LEVEL     Status: Normal   Collection Time    12/18/11  4:50 AM      Component Value Range   Valproic Acid Lvl 70.6  50.0 - 100.0 ug/mL   Dg Chest 2 View  12/17/2011    IMPRESSION: 1.  Ill-defined left lower lobe opacity concerning for early bronchopneumonia or sequelae of mild aspiration. 2.  Trace left pleural effusion.   Original Report Authenticated By: Florencia Reasons, M.D.    Assessment  [name] is a 21 y.o. year old male with cerebral palsy, mental retardation, and seizure disorder presenting with increased work of breathing and hypoxemia.     Plan:  1 Pneumonia (Aspiration vs CAP) - Pt presented to clinic on 10/23 with increased WOB and decreased saturations.   1) Given CTX x 1 along with starting Azithromycin.  Albuterol PRN.    2) Developed fever to 101.3 overnight and remained tachycardic to the 110's (high of 130 this AM)  3) Saturations have been good above 92% on 2 L of   4) Repeat CXR last night does not show worsening.  However, clinical picture does show worsening so switched to IV Zosyn from CTX.  Will continue the Azithromycin.  MRSA screen on admission was negative but if continues to have deterioration despite being on Zosyn, will add Vancomycin.  Blood Cultures were not drawn due to antibiotic use in outpatient but if continues to have fever, will draw these as well before starting Vancomycin.   5) Continuous pulse ox, vitals q floor, O2 as needed.   6) Will get another CBCAD.  Previous CBC shows WBC of 9.5. 2. Seizure Disorder - Continue depakote home dosing.  Valproic Acid level 70 on admission. 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 OSA - Continue O2 per home dosing  FEN/GI: Tube feeds per nutrition.  Home Prilosec Prophylaxis:  Lovenox 40 mg SQ Disposition: SDU and can transfer out pending  clinical improvement.  Code Status: DNI  Genola Yuille R. Montario Zilka DO, FMTS PGY-1

## 2011-12-18 NOTE — Telephone Encounter (Signed)
Dr. Delton Coombes please advise if this has been done or if there is anything we can help with. Carron Curie, CMA

## 2011-12-18 NOTE — Progress Notes (Signed)
I interviewed and examined this patient and discussed the care plan with Dr. Paulina Fusi and the [facility] team and agree with assessment and plan as documented in the progress note for today. He is currently awake and has a frequent strong cough. He appears to be well hydrated which we will continue to assure to prevent the mucus plugging that was thought to have occurred with his last episode of pneumonia earlier this year.     Najat Olazabal A. Sheffield Slider, MD Family Medicine Teaching Service Attending  12/18/2011 3:23 PM

## 2011-12-18 NOTE — Telephone Encounter (Signed)
I did the letter and printed it to be picked up

## 2011-12-19 DIAGNOSIS — J189 Pneumonia, unspecified organism: Secondary | ICD-10-CM | POA: Diagnosis present

## 2011-12-19 LAB — GLUCOSE, CAPILLARY: Glucose-Capillary: 112 mg/dL — ABNORMAL HIGH (ref 70–99)

## 2011-12-19 MED ORDER — LEVOFLOXACIN 25 MG/ML PO SOLN
750.0000 mg | Freq: Every day | ORAL | Status: DC
  Administered 2011-12-19: 750 mg
  Filled 2011-12-19 (×2): qty 30

## 2011-12-19 MED ORDER — ACETAMINOPHEN 160 MG/5ML PO SOLN
650.0000 mg | Freq: Four times a day (QID) | ORAL | Status: DC | PRN
  Administered 2011-12-19 – 2011-12-24 (×5): 650 mg via ORAL
  Filled 2011-12-19 (×5): qty 20.3

## 2011-12-19 NOTE — Progress Notes (Signed)
Dr. Casper Harrison notified about pt's parents refusing for him to transfer off of the unit to his new room.  Pt's father is stating "if he is well enough to transfer then we are going to take him home."

## 2011-12-19 NOTE — Telephone Encounter (Signed)
Thank you :)

## 2011-12-19 NOTE — Telephone Encounter (Signed)
Called and spoke with patients mother. Informed her that letter was available for pickup. Verbalized understanding of this. Nothing further needed at this time.  FYI: [name] would also like to inform Dr. Delton Coombes that patient is currently admitted at [facility] for Respiratory issues.

## 2011-12-19 NOTE — Progress Notes (Signed)
Dr. Madolyn Frieze has spoke with pt's father.  Pt transfer is on hold until pt's mother arrives and talks to MD.  Bed placement called and notified about the situation.

## 2011-12-19 NOTE — Progress Notes (Signed)
Dr. Sheffield Slider spoke to pt's father and with his status he will be remaining in the SDU.

## 2011-12-19 NOTE — Progress Notes (Signed)
PGY-1 Daily Progress Note Family Medicine Teaching Service [place] R. Marcus Schwandt, DO Service Pager: [phone]   Subjective: Pt did well overnight per nursing.  Mom and Dad are not present upon examination today.  Nursing states he did not have increased cough or desaturations overnight.     Objective:  VITALS Temp:  [98.4 F (36.9 C)-99.3 F (37.4 C)] 98.5 F (36.9 C) (10/25 0000) Pulse Rate:  [93-138] 93  (10/25 0700) Resp:  [11-25] 24  (10/25 0700) BP: (98-124)/(56-86) 98/62 mmHg (10/25 0600) SpO2:  [93 %-98 %] 97 % (10/25 0804) Weight:  [99 lb 6.8 oz (45.1 kg)] 99 lb 6.8 oz (45.1 kg) (10/25 0500)  In/Out  Intake/Output Summary (Last 24 hours) at 12/19/11 0918 Last data filed at 12/19/11 0500  Gross per 24 hour  Intake   1400 ml  Output    625 ml  Net    775 ml    Physical Exam: General: NAD  HEENT: poor dentition, OP with anatomical obstruction, no submandibular or submental lymphadenopathy  Heart: S1, S2 normal, no murmur, rub or gallop, regular rate and rhythm  Lungs:normal work of breathing, decreased breath sounds in LLL, rhonchi throughout lungs, improved Abdomen: gastric tube in left periumbilical area, well healed midline incision  Extremities: atrophied with contractures  Skin:no rashes, no wounds    MEDS Scheduled Meds:    . albuterol  2.5 mg Nebulization Q6H  . antiseptic oral rinse  15 mL Mouth Rinse BID  . baclofen  20 mg Per Tube TID WC  . divalproex  250 mg Oral Daily  . divalproex  375 mg Oral BID  . enoxaparin (LOVENOX) injection  40 mg Subcutaneous Q24H  . free water  30 mL Per Tube Q2H  . gabapentin  300 mg Per Tube TID  . Gerhardt's butt cream  1 application Topical BID  . glycopyrrolate  1 mg Per Tube TID  . levofloxacin (LEVAQUIN) IV  750 mg Intravenous Q24H  . multivitamin  5 mL Per Tube Daily  . pantoprazole sodium  40 mg Per Tube Daily  . piperacillin-tazobactam (ZOSYN)  IV  3.375 g Intravenous Q8H  . DISCONTD: azithromycin  250 mg Oral  Daily   Continuous Infusions:    . feeding supplement (OSMOLITE 1.2 CAL) 1,000 mL (12/17/11 1905)   PRN Meds:.acetaminophen, acetaminophen, albuterol, nystatin cream, DISCONTD: albuterol  Labs and imaging:   CBC  Lab 12/18/11 0942 12/17/11 1315  WBC 9.0 9.5  HGB 14.2 14.6  HCT 42.7 41.6  PLT 125* 151   BMET/CMET  Lab 12/17/11 1315  NA 130*  K 5.3*  CL 91*  CO2 27  BUN 7  CREATININE 0.23*  CALCIUM 9.3  PROT --  BILITOT --  ALKPHOS --  ALT --  AST --  GLUCOSE 92   Results for orders placed during the hospital encounter of 12/17/11 (from the past 24 hour(s))  CBC WITH DIFFERENTIAL     Status: Abnormal   Collection Time   12/18/11  9:42 AM      Component Value Range   WBC 9.0  4.0 - 10.5 K/uL   RBC 4.59  4.22 - 5.81 MIL/uL   Hemoglobin 14.2  13.0 - 17.0 g/dL   HCT 45.4  09.8 - 11.9 %   MCV 93.0  78.0 - 100.0 fL   MCH 30.9  26.0 - 34.0 pg   MCHC 33.3  30.0 - 36.0 g/dL   RDW 14.7  82.9 - 56.2 %   Platelets 125 (*)  150 - 400 K/uL   Neutrophils Relative 54  43 - 77 %   Neutro Abs 4.9  1.7 - 7.7 K/uL   Lymphocytes Relative 32  12 - 46 %   Lymphs Abs 2.9  0.7 - 4.0 K/uL   Monocytes Relative 14 (*) 3 - 12 %   Monocytes Absolute 1.3 (*) 0.1 - 1.0 K/uL   Eosinophils Relative 0  0 - 5 %   Eosinophils Absolute 0.0  0.0 - 0.7 K/uL   Basophils Relative 0  0 - 1 %   Basophils Absolute 0.0  0.0 - 0.1 K/uL   Dg Chest 2 View  12/17/2011    IMPRESSION: 1.  Ill-defined left lower lobe opacity concerning for early bronchopneumonia or sequelae of mild aspiration. 2.  Trace left pleural effusion.   Original Report Authenticated By: Florencia Reasons, M.D.    Assessment  JETLI LAUBSCHER is a 21 y.o. year old male with cerebral palsy, mental retardation, and seizure disorder presenting with increased work of breathing and hypoxemia.     Plan:  1 Pneumonia (Aspiration vs CAP) - Pt presented to clinic on 10/23 with increased WOB and decreased saturations.   1) Given CTX x 1  along with starting Azithromycin.  Switched albuterol to scheduled q 6 hrs yesterday and saturations and work of breathing improved overnight.   2) Afebrile overnight.  Slightly tachycardic as has been since admission.   3) Saturations have been good above 92% on 2 L of [place]  4) Did well over the last 24 hrs on Zosyn and Levaquin IV.  Will try to switch to Levaquin PO (per PEG) and see how he does in regards to vital signs and fever.   5) Continuous pulse ox, vitals q floor, O2 as needed.   6) Repeat CBC trending down.  Consider repeat if clinically worsens along with CXR.  2. Seizure Disorder - Continue depakote home dosing.  Valproic Acid level 70 on admission. 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 OSA - Continue O2 per home dosing  FEN/GI: Tube feeds per nutrition.  Home Prilosec Prophylaxis:  Lovenox 40 mg SQ Disposition: SDU and can transfer out pending clinical improvement.  Code Status: DNI  Azalie Harbeck R. Roselinda Bahena DO, FMTS PGY-1

## 2011-12-20 ENCOUNTER — Inpatient Hospital Stay (HOSPITAL_COMMUNITY): Payer: BC Managed Care – PPO

## 2011-12-20 LAB — BLOOD GAS, ARTERIAL
Bicarbonate: 33.2 mEq/L — ABNORMAL HIGH (ref 20.0–24.0)
O2 Saturation: 96.5 %
Patient temperature: 98.2
TCO2: 34.8 mmol/L (ref 0–100)
pO2, Arterial: 78.5 mmHg — ABNORMAL LOW (ref 80.0–100.0)

## 2011-12-20 MED ORDER — LEVALBUTEROL HCL 0.63 MG/3ML IN NEBU
0.6300 mg | INHALATION_SOLUTION | RESPIRATORY_TRACT | Status: DC | PRN
  Administered 2011-12-21 – 2011-12-23 (×2): 0.63 mg via RESPIRATORY_TRACT
  Filled 2011-12-20 (×2): qty 3

## 2011-12-20 MED ORDER — LEVALBUTEROL HCL 0.63 MG/3ML IN NEBU
0.6300 mg | INHALATION_SOLUTION | RESPIRATORY_TRACT | Status: DC
  Administered 2011-12-20 – 2012-01-03 (×81): 0.63 mg via RESPIRATORY_TRACT
  Filled 2011-12-20 (×90): qty 3

## 2011-12-20 MED ORDER — LEVOFLOXACIN 25 MG/ML PO SOLN
750.0000 mg | Freq: Every day | ORAL | Status: AC
  Administered 2011-12-20 – 2011-12-25 (×6): 750 mg
  Filled 2011-12-20 (×6): qty 30

## 2011-12-20 NOTE — Progress Notes (Signed)
I interviewed and examined this patient and discussed the care plan with Dr. Paulina Fusi and the [facility] team and agree with assessment and plan as documented in the progress note for today.    Jill Stopka A. Sheffield Slider, MD Family Medicine Teaching Service Attending  12/20/2011 11:46 AM

## 2011-12-20 NOTE — Progress Notes (Signed)
Pt's heart rate is fluctuating to 150's at time.  Can we change to xopenex.  Thanks so much  Respiratory Therapy

## 2011-12-20 NOTE — Progress Notes (Signed)
PGY-1 Daily Progress Note Family Medicine Teaching Service [place] R. Tristyn Pharris, DO Service Pager: [phone]   Subjective: History obtained by mom.  She does not feel that he did well overnight and did have desaturations into the upper 80's.  His WOB did increase overnight as well.   Objective:  VITALS Temp:  [97.4 F (36.3 C)-99 F (37.2 C)] 98.2 F (36.8 C) (10/26 0431) Pulse Rate:  [102-140] 132  (10/26 0431) Resp:  [16-27] 26  (10/26 0431) BP: (100-115)/(65-72) 100/65 mmHg (10/26 0400) SpO2:  [90 %-97 %] 95 % (10/26 0431) Weight:  [97 lb (44 kg)] 97 lb (44 kg) (10/26 0431)  In/Out  Intake/Output Summary (Last 24 hours) at 12/20/11 0748 Last data filed at 12/20/11 0435  Gross per 24 hour  Intake    624 ml  Output   1025 ml  Net   -401 ml    Physical Exam: General: NAD  HEENT: poor dentition, OP with anatomical obstruction, no submandibular or submental lymphadenopathy  Heart: S1, S2 normal, no murmur, rub or gallop, regular rate and rhythm  Lungs:Increased WOB, decreased breath sounds in LLL, rhonchi throughout lungs Abdomen: gastric tube in left periumbilical area, well healed midline incision  Extremities: atrophied with contractures  Skin:no rashes, no wounds    MEDS Scheduled Meds:    . albuterol  2.5 mg Nebulization Q6H  . antiseptic oral rinse  15 mL Mouth Rinse BID  . baclofen  20 mg Per Tube TID WC  . divalproex  250 mg Oral Daily  . divalproex  375 mg Oral BID  . enoxaparin (LOVENOX) injection  40 mg Subcutaneous Q24H  . free water  30 mL Per Tube Q2H  . gabapentin  300 mg Per Tube TID  . Gerhardt's butt cream  1 application Topical BID  . glycopyrrolate  1 mg Per Tube TID  . levofloxacin  750 mg Per Tube Daily  . multivitamin  5 mL Per Tube Daily  . pantoprazole sodium  40 mg Per Tube Daily  . DISCONTD: levofloxacin (LEVAQUIN) IV  750 mg Intravenous Q24H  . DISCONTD: piperacillin-tazobactam (ZOSYN)  IV  3.375 g Intravenous Q8H   Continuous Infusions:    . feeding supplement (OSMOLITE 1.2 CAL) 1,000 mL (12/17/11 1905)   PRN Meds:.acetaminophen (TYLENOL) oral liquid 160 mg/5 mL, acetaminophen, acetaminophen, albuterol, nystatin cream  Labs and imaging:   CBC  Lab 12/18/11 0942 12/17/11 1315  WBC 9.0 9.5  HGB 14.2 14.6  HCT 42.7 41.6  PLT 125* 151   BMET/CMET  Lab 12/17/11 1315  NA 130*  K 5.3*  CL 91*  CO2 27  BUN 7  CREATININE 0.23*  CALCIUM 9.3  PROT --  BILITOT --  ALKPHOS --  ALT --  AST --  GLUCOSE 92   Results for orders placed during the hospital encounter of 12/17/11 (from the past 24 hour(s))  BLOOD GAS, ARTERIAL     Status: Abnormal   Collection Time   12/20/11  5:02 AM      Component Value Range   FIO2 0.35     Delivery systems NASAL CANNULA     pH, Arterial 7.434  7.350 - 7.450   pCO2 arterial 50.3 (*) 35.0 - 45.0 mmHg   pO2, Arterial 78.5 (*) 80.0 - 100.0 mmHg   Bicarbonate 33.2 (*) 20.0 - 24.0 mEq/L   TCO2 34.8  0 - 100 mmol/L   Acid-Base Excess 8.6 (*) 0.0 - 2.0 mmol/L   O2 Saturation 96.5  Patient temperature 98.2     Collection site RIGHT RADIAL     Drawn by COLLECTED BY RT     Sample type ARTERIAL DRAW     Allens test (pass/fail) PASS  PASS   Dg Chest 2 View  12/17/2011    IMPRESSION: 1.  Ill-defined left lower lobe opacity concerning for early bronchopneumonia or sequelae of mild aspiration. 2.  Trace left pleural effusion.   Original Report Authenticated By: Florencia Reasons, M.D.    Assessment  NAM VOSSLER is a 21 y.o. year old male with cerebral palsy, mental retardation, and seizure disorder presenting with increased work of breathing and hypoxemia.     Plan:  1 Pneumonia (Aspiration vs CAP) - Pt presented to clinic on 10/23 with increased WOB and decreased saturations.   1) Given CTX x 1 along with starting Azithromycin.  Switched albuterol to scheduled q 6 hrs.  2) Afebrile overnight.  Did have desats into the upper 80's last night.  Pt had a CXR and ABG which did not  show worsening infiltrate in the LLL.  ABG showed chronic CO2 retention with proper compensation   4) Switched to G-tube Levaquin yesterday.  Consider adding back on Zosyn if pt develops fever or worsening clinically in regards to his pneumonia   5) Continuous pulse ox, vitals q floor, O2 as needed.   6) Repeat CBC trending down.  Consider repeat if clinically worsens along with CXR.  2. Seizure Disorder - Continue depakote home dosing.  Valproic Acid level 70 on admission. 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 OSA - Continue O2 per home dosing  FEN/GI: Tube feeds per nutrition.  Home Prilosec Prophylaxis:  Lovenox 40 mg SQ Disposition: SDU.  Watch overnight and if does well can be d/c.  Code Status: DNI  Rece Zechman R. Gerrod Maule DO, FMTS PGY-1

## 2011-12-20 NOTE — Progress Notes (Signed)
I interviewed and examined this patient and discussed the care plan with Dr. Paulina Fusi and the [facility] team and agree with assessment and plan as documented in the progress note for today. Sounded like mainly upper airway obstruction. He is having more retractions, but this improved when his father helped him sit up higher in the bed. I now hear better air movement in the left base. Father says [place] has CPAP at home, but it never seemed to help much. Main concern with the hypercarbia seen on the recent ABG is concern for his tiring. I ordered a 25, [place] Vitamin D level for the AM to consider the need for supplementation to help with long term muscle strength optimization.   Tavon Magnussen A. Sheffield Slider, MD Family Medicine Teaching Service Attending  12/20/2011 11:47 AM

## 2011-12-21 ENCOUNTER — Inpatient Hospital Stay (HOSPITAL_COMMUNITY): Payer: BC Managed Care – PPO

## 2011-12-21 DIAGNOSIS — J189 Pneumonia, unspecified organism: Secondary | ICD-10-CM

## 2011-12-21 DIAGNOSIS — R0602 Shortness of breath: Secondary | ICD-10-CM

## 2011-12-21 DIAGNOSIS — R0902 Hypoxemia: Secondary | ICD-10-CM

## 2011-12-21 DIAGNOSIS — G4733 Obstructive sleep apnea (adult) (pediatric): Secondary | ICD-10-CM

## 2011-12-21 DIAGNOSIS — G809 Cerebral palsy, unspecified: Secondary | ICD-10-CM

## 2011-12-21 LAB — CBC
Hemoglobin: 13.2 g/dL (ref 13.0–17.0)
MCH: 31 pg (ref 26.0–34.0)
MCV: 93 fL (ref 78.0–100.0)
Platelets: 121 10*3/uL — ABNORMAL LOW (ref 150–400)
RBC: 4.26 MIL/uL (ref 4.22–5.81)
WBC: 4.6 10*3/uL (ref 4.0–10.5)

## 2011-12-21 LAB — BASIC METABOLIC PANEL
CO2: 36 mEq/L — ABNORMAL HIGH (ref 19–32)
Chloride: 97 mEq/L (ref 96–112)
Glucose, Bld: 135 mg/dL — ABNORMAL HIGH (ref 70–99)
Sodium: 139 mEq/L (ref 135–145)

## 2011-12-21 MED ORDER — GUAIFENESIN 100 MG/5ML PO SOLN
200.0000 mg | Freq: Four times a day (QID) | ORAL | Status: DC
  Administered 2011-12-21 – 2012-01-05 (×61): 200 mg
  Filled 2011-12-21 (×65): qty 10

## 2011-12-21 MED ORDER — GUAIFENESIN 100 MG/5ML PO SYRP
200.0000 mg | ORAL_SOLUTION | Freq: Four times a day (QID) | ORAL | Status: DC
  Filled 2011-12-21 (×5): qty 118

## 2011-12-21 NOTE — Consult Note (Signed)
Name: [name] MRN: [id number] DOB: May 16, 1990    LOS: 4  Referring Provider:  Dr Sheffield Slider, FPTS Reason for Referral:  Respiratory distress, hypoxemia  PULMONARY / CRITICAL CARE MEDICINE  HPI:  21 yo man with cerebral palsy, MR, seizures, known to me from outpatient office. He was admitted to [facility] on 10/23 with few days of increased secretions, resp difficulty requiring albuterol (which he often does not require) and low grade fevers. He has been treated for possible HCAP. Course c/b increase freq seizures, some agitation associated with resp distress and desaturations.   Past Medical History  Diagnosis Date  . Allergy   . Asthma   . Cerebral palsy, quadriplegic   . Congenital CMV infection   . Neuromuscular disorder     cp  . H/O hiatal hernia   . Pneumonia     "he's had it several times; maybe now" (12/17/2011)  . Community acquired pneumonia 06/30/2011    Psuedomonas PNA 06/2011 >tx 21 d of Cipro  CXR 08/15/11 >clearance of PNA    . GERD (gastroesophageal reflux disease)   . Seizures     "has had gran mals before" (12/17/2011)  . Cytomegalovirus   . On home oxygen therapy     "at night" (12/17/2011"   Past Surgical History  Procedure Date  . Nissen fundoplication   . Rod ~ 2003 "or before"    "in his back" (12/17/2011   Prior to Admission medications   Medication Sig Start Date End Date Taking? Authorizing Provider  albuterol (PROVENTIL) (2.5 MG/3ML) 0.083% nebulizer solution Take 2.5 mg by nebulization every 6 (six) hours as needed. Shortness of breath   Yes Historical Provider, MD  artificial tears (LACRILUBE) OINT ophthalmic ointment Apply to eye every 4 (four) hours as needed. Please provide bottle used in hospital 07/07/11  Yes Andrena Mews, DO  baclofen (LIORESAL) 10 MG tablet TAKE 2 TABLETS INTO FEEDING TUBE 3 TIMES A DAY 10/19/11  Yes Shelly Rubenstein, MD  divalproex (DEPAKOTE SPRINKLE) 125 MG capsule 3 caps per tube at 6 am, 2 caps per tube at 2 pm, 3 caps per tube  at 9 pm    Yes Historical Provider, MD  Feeding Tubes - Pump (KANGAROO JOEY ENTERAL PUMP) MISC 30 Units by Does not apply route daily. Please provide bags for Kangroo Joey enteral feeding device. Should be changed daily. 10/20/11  Yes Shelly Rubenstein, MD  gabapentin (NEURONTIN) 300 MG capsule 1 tab per tube three times a day    Yes Historical Provider, MD  glycopyrrolate (ROBINUL) 1 MG tablet 1 TAB PER TUBE THREE TIMES A DAY FOR LUNG SECRETIONS 10/19/11  Yes Shelly Rubenstein, MD  Hydrocortisone (GERHARDT'S BUTT CREAM) CREA Apply 1 application topically 2 (two) times daily. Please provide remained of tube used in hospital 07/08/11  Yes Andrena Mews, DO  omeprazole (PRILOSEC) 2 mg/mL SUSP Take 10 mLs (20 mg total) by mouth daily at 12 noon. 12/11/11  Yes Shelly Rubenstein, MD  PROVENTIL HFA 108 [phone] BASE) MCG/ACT inhaler USE 2 PUFFS EVERY 4 HOURS AS NEEDED FOR WHEEZE 09/21/11  Yes Elsie [place], MD  diazepam (DIASTAT) 2.5 MG GEL Unknown dose. Prescribed by Dr. Sharene Skeans, neurology    Historical Provider, MD   Allergies Allergies  Allergen Reactions  . Sulfamethoxazole W-Trimethoprim Rash    "don't know how bad" /father 12/17/2011    Family History Family History  Problem Relation Age of Onset  . Allergies  Mother   . Heart disease Maternal Uncle   . Colon cancer Paternal Grandmother    Social History  reports that he has never smoked. He has never used smokeless tobacco. He reports that he does not drink alcohol or use illicit drugs.  Review Of Systems:  Unable to obtain  Events Since Admission:  Current Status: Guarded  Subjective:  Mother reports period of agitation this am with associated desaturation and tachypnea. Seemed to improve after bronchodilator  Vital Signs: Temp:  [99.4 F (37.4 C)-100 F (37.8 C)] 99.4 F (37.4 C) (10/27 0346) Pulse Rate:  [120-139] 132  (10/27 0859) Resp:  [19-36] 36  (10/27 0859) BP: (105-128)/(63-82) 105/63 mmHg (10/27 0859) SpO2:  [92 %-99  %] 99 % (10/27 0952) FiO2 (%):  [50 %] 50 % (10/27 0002) Weight:  [45 kg (99 lb 3.3 oz)] 45 kg (99 lb 3.3 oz) (10/26 2352)  Physical Examination: General:  Ill appearing, non-communcative Neuro:  Quadriplegia, some eye movements HEENT:  Some UA obstruction from tongue, disconjugate gaze Neck:  No overt stridor Cardiovascular:  Tachy, regular Lungs: using accessory muscles, bronchial BS on L, insp crackles. Some referred UA noise on the R Abdomen:  benign Musculoskeletal:  contracted Skin:  No wounds or rashes  Principal Problem:  *Community acquired pneumonia Active Problems:  MENTAL RETARDATION  SLEEP APNEA, OBSTRUCTIVE  CEREBRAL PALSY  SOB (shortness of breath)   Lab 12/17/11 1315  NA 130*  K 5.3*  CL 91*  CO2 27  GLUCOSE 92  BUN 7  CREATININE 0.23*  CALCIUM 9.3  MG --  PHOS --    Lab 12/21/11 0445 12/18/11 0942 12/17/11 1315  HGB 13.2 14.2 14.6  HCT 39.6 42.7 41.6  WBC 4.6 9.0 9.5  PLT 121* 125* 151    Lab 12/20/11 0502  PHART 7.434  PCO2ART 50.3*  PO2ART 78.5*  HCO3 33.2*  O2SAT 96.5   Ventilator Settings: Vent Mode:  [-]  FiO2 (%):  [50 %] 50 %  CXR:  10/26 >> Possible LLL infiltrate, R clear  Impressions/Recommendations:   1. Acute respiratory failure due to probable infectious process (viral vs superimposed bacterial PNA) in a debilitated man with no pulmonary reserve - agree with treating as possible HCAP even in absence of positive cx's - check Pct to help guide abx decisions - aggressive pulmonary toilet - chest vest, positioning, suctioning - mucinex; consider adding other decongestants such as chlorpheniramine or brompheniramine if they won't interact with his other medical regimen - discussed the pro's and con's of BiPAP with pt's mom. Would be appropriate to try if worsening distress to ease WOB. The drawbacks would be increased aspiration risk, possible agitation.  - confirmed with pt's mom and w Dr Sheffield Slider that we will not intubate Vinod,  will not move towards tracheostomy. We will support aggressively w all medical care  2. Seizure disorder  3. OSA (not on CPAP outpt)  4. Cerebral Palsy  Levy Pupa, MD, PhD 12/21/2011, 11:34 AM [facility] [phone] or if no answer [phone]

## 2011-12-21 NOTE — Progress Notes (Signed)
I interviewed and examined this patient and discussed the care plan with Dr.Hunter and the FPTS team and agree with assessment and plan as documented in the progress note for today. His mother was in the room when Dr Delton Coombes joined [place]. He knows [name] well from being his primary pulmonary physician. [name] is currently breathing much more comfortably. His mother became slightly tearful reflecting that [name] seems to be having more difficulty recovering from each respiratory illness and that they were told when he was very young that he wouldn't live past his teen years. Dr Delton Coombes reviewed the pros and cons of Bipap, and recommends avoiding it since it would likely agitate [name].     [name] A. Sheffield Slider, MD Family Medicine Teaching Service Attending  12/21/2011 12:00 PM

## 2011-12-21 NOTE — Progress Notes (Signed)
At the beginning of the shift pt's mother said that she notice that pt was having short intermittent seizures and described them as head pulling more severely to left, eyes fixed to left, face reddening, BUE rigidity, with increased heartrate and mildly declining O2 sats (90-92% from 94-96%). She stated that she witnessed 3-5 of these events while sitting alone with the pt.  Scheduled seizure meds given, as they were due. No similar event witnessed by staff at that time.  At 2358, pt did have an event matching that description lasting 35-45 seconds. MD was notified. Pt's rectal temp was 100F degrees.  Tylenol, per MD order, given as pt's mother has mentioned that fevers often precipitate seizure activity.  Will continue to monitor.

## 2011-12-21 NOTE — Progress Notes (Signed)
Pt. Breathing labored. Pt. On NRB sat 97-99%. RR 25-33. Pt. Mother requested to re-page Dr. Paulina Fusi to re-eval. Pt. Dr. Paulina Fusi notified and rounded. Resp. Called to re-eval pt, resp. Therapist rounded.

## 2011-12-21 NOTE — Progress Notes (Signed)
PGY-2 Daily Progress Note Family Medicine Teaching Service Service Pager: [phone]   Subjective/overnight: History obtained per overnight nurse and resident. patient had another desaturation into the 80s on 5L. Advanced to venturi mask. RN stated tried NT suctioning this morning and patient became agitated with cough and had to advance to nonrebreather. Additionally, patient was reported to have 3 periods of seizure like activity (per mother becomes red in the face and deviates head to the left more than normal. Nursing witnessed one of these event.   Objective:  VITALS Temp:  [99.4 F (37.4 C)-100 F (37.8 C)] 99.4 F (37.4 C) (10/27 0346) Pulse Rate:  [120-139] 138  (10/27 0432) Resp:  [19-33] 33  (10/27 0432) BP: (105-128)/(67-82) 111/77 mmHg (10/27 0346) SpO2:  [92 %-98 %] 98 % (10/27 0803) FiO2 (%):  [50 %] 50 % (10/27 0002) Weight:  [99 lb 3.3 oz (45 kg)] 99 lb 3.3 oz (45 kg) (10/26 2352)  In/Out  Intake/Output Summary (Last 24 hours) at 12/21/11 0838 Last data filed at 12/21/11 0700  Gross per 24 hour  Intake   1110 ml  Output    675 ml  Net    435 ml    Physical Exam: General: NAD  HEENT: poor dentition, MMM, mildly red over face with slight diaphoresis Heart: S1, S2 normal, no murmur, rub or gallop, regular rate and rhythm  Lungs: use of accessory muscles noted by supraclavicular retractions, tachpneic into 30s initially coming to mid 20s (please note patient recently suctioned with NT suctioning) decreased breath sounds in LLL, upper airway sounds transmitted throughout lungs Abdomen: gastric tube in left periumbilical area, well healed midline incision  Extremities: atrophied with contractures  Skin:no rashes, no wounds    MEDS Scheduled Meds:    . antiseptic oral rinse  15 mL Mouth Rinse BID  . baclofen  20 mg Per Tube TID WC  . divalproex  250 mg Oral Daily  . divalproex  375 mg Oral BID  . enoxaparin (LOVENOX) injection  40 mg Subcutaneous Q24H  . free  water  30 mL Per Tube Q2H  . gabapentin  300 mg Per Tube TID  . Gerhardt's butt cream  1 application Topical BID  . glycopyrrolate  1 mg Per Tube TID  . levalbuterol  0.63 mg Nebulization Q4H  . levofloxacin  750 mg Per Tube Daily  . multivitamin  5 mL Per Tube Daily  . pantoprazole sodium  40 mg Per Tube Daily  . DISCONTD: albuterol  2.5 mg Nebulization Q6H  . DISCONTD: levofloxacin  750 mg Per Tube Daily   Continuous Infusions:    . feeding supplement (OSMOLITE 1.2 CAL) 1,000 mL (12/20/11 0510)   PRN Meds:.acetaminophen (TYLENOL) oral liquid 160 mg/5 mL, levalbuterol, nystatin cream, DISCONTD: acetaminophen, DISCONTD: acetaminophen, DISCONTD: albuterol  Labs and imaging:   CBC  Lab 12/21/11 0445 12/18/11 0942 12/17/11 1315  WBC 4.6 9.0 9.5  HGB 13.2 14.2 14.6  HCT 39.6 42.7 41.6  PLT 121* 125* 151   BMET/CMET  Lab 12/17/11 1315  NA 130*  K 5.3*  CL 91*  CO2 27  BUN 7  CREATININE 0.23*  CALCIUM 9.3  PROT --  BILITOT --  ALKPHOS --  ALT --  AST --  GLUCOSE 92    Dg Chest Port 1 View 12/20/2011  IMPRESSION: Infiltration or atelectasis remains in the left lung base without change.   Original Report Authenticated By: [name], M.D.      Assessment  [name]  [name] is a 21 y.o. year old male with cerebral palsy, mental retardation, and seizure disorder presenting with increased work of breathing and hypoxemia concerning for viral vs. Bacterial pneumonia with likely mucus plugging causing desaturations    Plan:  1 Pneumonia (Aspiration vs CAP) - Pt presented to clinic on 10/23 with increased WOB and decreased saturations.   1) On day 4 of antibiotic. On Levaquin alone since 10/25. Increased work of breathing typically overnight and now on nonrebreather. Patient febrile yesterday morning but afebrile since. WBC trending down.   2)Given that patient was just NT suctioned, will observe to see if respiratory status related to agitation. If increased work of  breathing persists, will repeat CXR and consider restarting Zosyn which was stopped 10/25.   3) ABG yesterday showed hypercarbia. Concern for patient tiring vs likely chronic issue. Pending vitamin d to see if supplementation may aid strength.   4) albuterol to scheduled q 6 hrs.. Not appreciating any wheeze at this time. Consider making prn.   5) will add guaifenesin to thin secretions given concern for mucus plugging 2. Seizure Disorder - seizures x3 yesterday evening but <1 minute and no ativan required. Will order BMET at this time. Valproic acid level adequate at admission. Continue depakote home dosing.  Valproic Acid level 70 on admission. If seizures persist throughout today, may discuss with patient's neurologist for recommendations.  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 OSA - Continue O2 (on home oxygen 2L)  FEN/GI: Tube feeds per nutrition.  Home Prilosec. BMET now Prophylaxis:  Lovenox 40 mg SQ Disposition: SDU given nonrebreather requirement Code Status: DNI  Aldine Contes. Marti Sleigh, MD, PGY2 12/21/2011 8:59 AM

## 2011-12-22 LAB — BASIC METABOLIC PANEL
BUN: 7 mg/dL (ref 6–23)
CO2: 37 mEq/L — ABNORMAL HIGH (ref 19–32)
Glucose, Bld: 131 mg/dL — ABNORMAL HIGH (ref 70–99)
Potassium: 3.3 mEq/L — ABNORMAL LOW (ref 3.5–5.1)
Sodium: 137 mEq/L (ref 135–145)

## 2011-12-22 LAB — VALPROIC ACID LEVEL: Valproic Acid Lvl: 58.4 ug/mL (ref 50.0–100.0)

## 2011-12-22 NOTE — Progress Notes (Signed)
Name: [name] MRN: [id number] DOB: 1990-11-01    LOS: 5  Referring Provider:  Dr Sheffield Slider, FPTS Reason for Referral:  Respiratory distress, hypoxemia  PULMONARY / CRITICAL CARE MEDICINE  HPI:  21 yo man with cerebral palsy, MR, seizures, known to me from outpatient office. He was admitted to [facility] on 10/23 with few days of increased secretions, resp difficulty requiring albuterol (which he often does not require) and low grade fevers. He has been treated for possible HCAP. Course c/b increase freq seizures, some agitation associated with resp distress and desaturations.   Past Medical History  Diagnosis Date  . Allergy   . Asthma   . Cerebral palsy, quadriplegic   . Congenital CMV infection   . Neuromuscular disorder     cp  . H/O hiatal hernia   . Pneumonia     "he's had it several times; maybe now" (12/17/2011)  . Community acquired pneumonia 06/30/2011    Psuedomonas PNA 06/2011 >tx 21 d of Cipro  CXR 08/15/11 >clearance of PNA    . GERD (gastroesophageal reflux disease)   . Seizures     "has had gran mals before" (12/17/2011)  . Cytomegalovirus   . On home oxygen therapy     "at night" (12/17/2011"   Past Surgical History  Procedure Date  . Nissen fundoplication   . Rod ~ 2003 "or before"    "in his back" (12/17/2011   Prior to Admission medications   Medication Sig Start Date End Date Taking? Authorizing Provider  albuterol (PROVENTIL) (2.5 MG/3ML) 0.083% nebulizer solution Take 2.5 mg by nebulization every 6 (six) hours as needed. Shortness of breath   Yes Historical Provider, MD  artificial tears (LACRILUBE) OINT ophthalmic ointment Apply to eye every 4 (four) hours as needed. Please provide bottle used in hospital 07/07/11  Yes Andrena Mews, DO  baclofen (LIORESAL) 10 MG tablet TAKE 2 TABLETS INTO FEEDING TUBE 3 TIMES A DAY 10/19/11  Yes Shelly Rubenstein, MD  divalproex (DEPAKOTE SPRINKLE) 125 MG capsule 3 caps per tube at 6 am, 2 caps per tube at 2 pm, 3 caps per tube  at 9 pm    Yes Historical Provider, MD  Feeding Tubes - Pump (KANGAROO JOEY ENTERAL PUMP) MISC 30 Units by Does not apply route daily. Please provide bags for Kangroo Joey enteral feeding device. Should be changed daily. 10/20/11  Yes Shelly Rubenstein, MD  gabapentin (NEURONTIN) 300 MG capsule 1 tab per tube three times a day    Yes Historical Provider, MD  glycopyrrolate (ROBINUL) 1 MG tablet 1 TAB PER TUBE THREE TIMES A DAY FOR LUNG SECRETIONS 10/19/11  Yes Shelly Rubenstein, MD  Hydrocortisone (GERHARDT'S BUTT CREAM) CREA Apply 1 application topically 2 (two) times daily. Please provide remained of tube used in hospital 07/08/11  Yes Andrena Mews, DO  omeprazole (PRILOSEC) 2 mg/mL SUSP Take 10 mLs (20 mg total) by mouth daily at 12 noon. 12/11/11  Yes Shelly Rubenstein, MD  PROVENTIL HFA 108 [phone] BASE) MCG/ACT inhaler USE 2 PUFFS EVERY 4 HOURS AS NEEDED FOR WHEEZE 09/21/11  Yes Elsie [place], MD  diazepam (DIASTAT) 2.5 MG GEL Unknown dose. Prescribed by Dr. Sharene Skeans, neurology    Historical Provider, MD   Allergies Allergies  Allergen Reactions  . Sulfamethoxazole W-Trimethoprim Rash    "don't know how bad" /father 12/17/2011    Family History Family History  Problem Relation Age of Onset  . Allergies  Mother   . Heart disease Maternal Uncle   . Colon cancer Paternal Grandmother    Social History  reports that he has never smoked. He has never used smokeless tobacco. He reports that he does not drink alcohol or use illicit drugs.  Review Of Systems:  Unable to obtain  Events Since Admission:  Current Status: Guarded  Subjective:  Mother reports period of agitation this am with associated desaturation and tachypnea. Seemed to improve after bronchodilator  Vital Signs: Temp:  [98 F (36.7 C)-99.1 F (37.3 C)] 98.2 F (36.8 C) (10/28 1117) Pulse Rate:  [97-142] 97  (10/28 1117) Resp:  [16-32] 23  (10/28 1117) BP: (94-131)/(62-83) 112/64 mmHg (10/28 1117) SpO2:  [92 %-100 %]  100 % (10/28 1117) Weight:  [43 kg (94 lb 12.8 oz)] 43 kg (94 lb 12.8 oz) (10/28 0300)  Physical Examination: General:  Ill appearing, non-communcative Neuro:  Quadriplegia, some eye movements HEENT:  Some UA obstruction from tongue, disconjugate gaze Neck:  No overt stridor Cardiovascular:  Tachy, regular Lungs: using accessory muscles, bronchial BS on L, insp crackles. Some referred UA noise on the R Abdomen:  benign Musculoskeletal:  contracted Skin:  No wounds or rashes  Principal Problem:  *Community acquired pneumonia Active Problems:  MENTAL RETARDATION  SLEEP APNEA, OBSTRUCTIVE  CEREBRAL PALSY  SOB (shortness of breath)   Lab 12/22/11 0505 12/21/11 1122 12/17/11 1315  NA 137 139 130*  K 3.3* 3.8 --  CL 97 97 91*  CO2 37* 36* 27  GLUCOSE 131* 135* 92  BUN 7 4* 7  CREATININE 0.22* <0.20* 0.23*  CALCIUM 9.4 9.4 9.3  MG -- -- --  PHOS -- -- --    Lab 12/21/11 0445 12/18/11 0942 12/17/11 1315  HGB 13.2 14.2 14.6  HCT 39.6 42.7 41.6  WBC 4.6 9.0 9.5  PLT 121* 125* 151    Lab 12/20/11 0502  PHART 7.434  PCO2ART 50.3*  PO2ART 78.5*  HCO3 33.2*  O2SAT 96.5   Ventilator Settings:    CXR:  10/26 >> Possible LLL infiltrate, R clear  Impressions/Recommendations:   1. Acute respiratory failure due to probable infectious process (viral vs superimposed bacterial PNA) in a debilitated man with no pulmonary reserve - Agree with treating as possible HCAP even in absence of positive cx's, on levofloxacin with stabilization of WBC and fever.  Even with PCT of <0.1 would finish a course of levo (8 days) given aspiration and clinical condition. - Aggressive pulmonary toilet - chest vest, positioning, suctioning as I have little doubt that aspiration is a large contributing factor here. - Mucinex; consider adding other decongestants such as chlorpheniramine or brompheniramine if they won't interact with his other medical regimen - Discussed via Dr. Delton Coombes, the pro's and  con's of BiPAP with pt's mom. Would be appropriate to try if worsening distress to ease WOB. The drawbacks would be increased aspiration risk, possible agitation.  - Code status noted, no intubation, BiPAP only for comfort if there are signs of distress.  2. Seizure disorder  3. OSA (not on CPAP outpt)  4. Cerebral Palsy  Alyson Reedy, M.D. [facility] Pulmonary/Critical Care Medicine. Pager: [phone]. After hours pager: [phone].

## 2011-12-22 NOTE — Progress Notes (Signed)
FMTS Attending Daily Note: Denny Levy MD [phone] pager office [phone] I  have seen and examined this patient, reviewed their chart. I have discussed this patient with the resident. I agree with the resident's findings, assessment and care plan. I am unsure how much of his increased WOB is related to his presumed lung infection vs decline in pulmonary function, mucous plugging, etc. Appreciate pulmonary input.

## 2011-12-22 NOTE — Progress Notes (Signed)
PGY-2 Daily Progress Note Family Medicine Teaching Service Service Pager: [phone]   Subjective/overnight: History obtained per overnight nurse and resident. Pt remained on venturi mask overnight and did well on this.  No further seizure like activity reported. Dr. Delton Coombes, his outpatient pulmonologist, did come by to see the patient and talk to his mother.   Objective:  VITALS Temp:  [98 F (36.7 C)-99.1 F (37.3 C)] 98.1 F (36.7 C) (10/28 0800) Pulse Rate:  [104-142] 126  (10/28 0800) Resp:  [16-32] 24  (10/28 0800) BP: (94-131)/(62-83) 109/77 mmHg (10/28 0800) SpO2:  [92 %-100 %] 99 % (10/28 0800) Weight:  [94 lb 12.8 oz (43 kg)] 94 lb 12.8 oz (43 kg) (10/28 0300)  In/Out  Intake/Output Summary (Last 24 hours) at 12/22/11 0939 Last data filed at 12/22/11 0900  Gross per 24 hour  Intake   1248 ml  Output    400 ml  Net    848 ml    Physical Exam: General: NAD  HEENT: poor dentition, MMM, mildly red over face with slight diaphoresis Heart: S1, S2 normal, no murmur, rub or gallop, regular rate and rhythm  Lungs: use of accessory muscles noted by supraclavicular retractions, decreased breath sounds in LLL, upper airway sounds transmitted throughout lungs Abdomen: gastric tube in left periumbilical area, well healed midline incision  Extremities: atrophied with contractures  Skin:no rashes, no wounds    MEDS Scheduled Meds:    . antiseptic oral rinse  15 mL Mouth Rinse BID  . baclofen  20 mg Per Tube TID WC  . divalproex  250 mg Oral Daily  . divalproex  375 mg Oral BID  . enoxaparin (LOVENOX) injection  40 mg Subcutaneous Q24H  . free water  30 mL Per Tube Q2H  . gabapentin  300 mg Per Tube TID  . Gerhardt's butt cream  1 application Topical BID  . glycopyrrolate  1 mg Per Tube TID  . guaiFENesin  200 mg Per Tube Q6H  . levalbuterol  0.63 mg Nebulization Q4H  . levofloxacin  750 mg Per Tube Daily  . multivitamin  5 mL Per Tube Daily  . pantoprazole sodium  40 mg  Per Tube Daily   Continuous Infusions:    . feeding supplement (OSMOLITE 1.2 CAL) 1,000 mL (12/22/11 0056)   PRN Meds:.acetaminophen (TYLENOL) oral liquid 160 mg/5 mL, levalbuterol, nystatin cream  Labs and imaging:   CBC  Lab 12/21/11 0445 12/18/11 0942 12/17/11 1315  WBC 4.6 9.0 9.5  HGB 13.2 14.2 14.6  HCT 39.6 42.7 41.6  PLT 121* 125* 151   BMET/CMET  Lab 12/22/11 0505 12/21/11 1122 12/17/11 1315  NA 137 139 130*  K 3.3* 3.8 5.3*  CL 97 97 91*  CO2 37* 36* 27  BUN 7 4* 7  CREATININE 0.22* <0.20* 0.23*  CALCIUM 9.4 9.4 9.3  PROT -- -- --  BILITOT -- -- --  ALKPHOS -- -- --  ALT -- -- --  AST -- -- --  GLUCOSE 131* 135* 92    Dg Chest Port 1 View 12/20/2011  IMPRESSION: Infiltration or atelectasis remains in the left lung base without change.   Original Report Authenticated By: [name], M.D.    DG CXR 12/21/11 IMPRESSION:  Bibasilar consolidation may represent infiltrates and / or  atelectasis possibly with small pleural effusions.  Pulmonary vascular prominence most notable centrally.   Assessment  [name] is a 21 y.o. year old male with cerebral palsy, mental retardation, and  seizure disorder presenting with increased work of breathing and hypoxemia concerning for viral vs. Bacterial pneumonia with likely mucus plugging causing desaturations    Plan:  1 Pneumonia (Aspiration vs CAP vs viral) - Pt presented to clinic on 10/23 with increased WOB and decreased saturations.   1) On day 5 of antibiotic. On Levaquin alone since 10/25. Increased work of breathing typically overnight and now on nonrebreather. Pt afebrile overnight.   2) Dr. Delton Coombes, his outpatient pulmonologist, is following pt.  Appreciate input. Will check pro calcitonin daily to help guide management of ABx  3) ABG yesterday showed hypercarbia. Concern for patient tiring vs likely chronic issue. Pending vitamin d to see if supplementation may aid strength.   4) Xopenex 2 hrs PRN  for SOB as well as scheduled q 4 hrs.   5) Guaifenasin added yesterday to help decrease thickened secretions.   6) Continue Chest PT, RT recommendations in regards to his respiration status.  2. Seizure Disorder - stable over the last 24 hrs.  1) Valproic acid level adequate at admission. Continue depakote home dosing.  Valproic Acid level 70 on admission.  2) Will speak to Dr. Sharene Skeans if has another seizure.  3) Will consider adding ativan 0.5 mg prn 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 OSA - Continue venturi mask overnight due to desaturations.  He most likely desats at nights at home but he is on pulse ox continuous right now.    FEN/GI: Tube feeds per nutrition.  Home Prilosec.  Prophylaxis:  Lovenox 40 mg SQ Disposition: SDU given nonrebreather requirement Code Status: DNI  [name] R. Paulina Fusi, DO of [facility] 12/22/2011, 9:50 AM

## 2011-12-23 ENCOUNTER — Inpatient Hospital Stay (HOSPITAL_COMMUNITY): Payer: BC Managed Care – PPO

## 2011-12-23 LAB — BASIC METABOLIC PANEL
BUN: 8 mg/dL (ref 6–23)
CO2: 32 mEq/L (ref 19–32)
Chloride: 97 mEq/L (ref 96–112)
Glucose, Bld: 106 mg/dL — ABNORMAL HIGH (ref 70–99)
Potassium: 3.9 mEq/L (ref 3.5–5.1)
Sodium: 137 mEq/L (ref 135–145)

## 2011-12-23 MED ORDER — ENOXAPARIN SODIUM 30 MG/0.3ML ~~LOC~~ SOLN
30.0000 mg | SUBCUTANEOUS | Status: DC
  Administered 2011-12-23 – 2012-01-04 (×13): 30 mg via SUBCUTANEOUS
  Filled 2011-12-23 (×14): qty 0.3

## 2011-12-23 NOTE — Progress Notes (Signed)
FMTS Attending Daily Note: Todd Levy MD [phone] pager office [phone] I  have seen and examined this patient, reviewed their chart. I have discussed this patient with the resident. I agree with the resident's findings, assessment and care plan. Appreciate Dr Kavin Leech care. Have broached subject with Mom regarding SNF---either short term rehab or long term care. She is considering but likely will want to return home if they can manage.

## 2011-12-23 NOTE — Progress Notes (Signed)
PGY-2 Daily Progress Note Family Medicine Teaching Service Service Pager: [phone]   Subjective/overnight: History obtained per overnight nurse and resident. Pt remained on venturi mask overnight and did well on this.  No further seizure like activity reported. Dr. Delton Coombes, his outpatient pulmonologist, did come by to see the patient and talk to his mother.   Objective:  VITALS Temp:  [97.4 F (36.3 C)-98.3 F (36.8 C)] 97.4 F (36.3 C) (10/29 0825) Pulse Rate:  [97-126] 126  (10/29 0825) Resp:  [14-25] 22  (10/29 0825) BP: (101-122)/(64-89) 118/70 mmHg (10/29 0825) SpO2:  [89 %-100 %] 90 % (10/29 0825) FiO2 (%):  [50 %-100 %] 100 % (10/29 0825)  In/Out  Intake/Output Summary (Last 24 hours) at 12/23/11 0930 Last data filed at 12/23/11 0400  Gross per 24 hour  Intake   1086 ml  Output      0 ml  Net   1086 ml    Physical Exam: General: NAD  HEENT: poor dentition, MMM, mildly red over face with slight diaphoresis Heart: S1, S2 normal, no murmur, rub or gallop, regular rate and rhythm  Lungs: use of accessory muscles noted by supraclavicular retractions, decreased breath sounds in LLL, upper airway sounds transmitted throughout lungs Abdomen: gastric tube in left periumbilical area, well healed midline incision  Extremities: atrophied with contractures  Skin:no rashes, no wounds    MEDS Scheduled Meds:    . antiseptic oral rinse  15 mL Mouth Rinse BID  . baclofen  20 mg Per Tube TID WC  . divalproex  250 mg Oral Daily  . divalproex  375 mg Oral BID  . enoxaparin (LOVENOX) injection  40 mg Subcutaneous Q24H  . free water  30 mL Per Tube Q2H  . gabapentin  300 mg Per Tube TID  . Gerhardt's butt cream  1 application Topical BID  . glycopyrrolate  1 mg Per Tube TID  . guaiFENesin  200 mg Per Tube Q6H  . levalbuterol  0.63 mg Nebulization Q4H  . levofloxacin  750 mg Per Tube Daily  . multivitamin  5 mL Per Tube Daily  . pantoprazole sodium  40 mg Per Tube Daily    Continuous Infusions:    . feeding supplement (OSMOLITE 1.2 CAL) 1,000 mL (12/22/11 0056)   PRN Meds:.acetaminophen (TYLENOL) oral liquid 160 mg/5 mL, levalbuterol, nystatin cream  Labs and imaging:   CBC  Lab 12/21/11 0445 12/18/11 0942 12/17/11 1315  WBC 4.6 9.0 9.5  HGB 13.2 14.2 14.6  HCT 39.6 42.7 41.6  PLT 121* 125* 151   BMET/CMET  Lab 12/23/11 0710 12/22/11 0505 12/21/11 1122  NA 137 137 139  K 3.9 3.3* 3.8  CL 97 97 97  CO2 32 37* 36*  BUN 8 7 4*  CREATININE <0.20* 0.22* <0.20*  CALCIUM 9.4 9.4 9.4  PROT -- -- --  BILITOT -- -- --  ALKPHOS -- -- --  ALT -- -- --  AST -- -- --  GLUCOSE 106* 131* 135*    Dg Chest Port 1 View 12/20/2011  IMPRESSION: Infiltration or atelectasis remains in the left lung base without change.   Original Report Authenticated By: [name], M.D.    DG CXR 12/21/11 IMPRESSION:  Bibasilar consolidation may represent infiltrates and / or  atelectasis possibly with small pleural effusions.  Pulmonary vascular prominence most notable centrally.   Assessment  [name] is a 21 y.o. year old male with cerebral palsy, mental retardation, and seizure disorder presenting with increased  work of breathing and hypoxemia concerning for viral vs. Bacterial pneumonia with likely mucus plugging causing desaturations    Plan:  1 Pneumonia (Aspiration vs CAP vs viral) - Pt presented to clinic on 10/23 with increased WOB and decreased saturations.   1) On day 6 of antibiotic. On Levaquin alone since 10/25. Increased work of breathing typically overnight and now on nonrebreather. Will try to switch him to venti mask and eventually O2 [place].  Keep saturations above 88%.  Pt afebrile overnight.   2) Dr. Delton Coombes, his outpatient pulmonologist, is following pt.  Appreciate input. After speaking with Dr. Delton Coombes on 10/28, he recommends trying to wean him down on the O2 and when his WOB goes down, at that point will be closer to d/c.   3) Xopenex 2  hrs PRN for SOB as well as scheduled q 4 hrs. Thought is to decrease the B agonist effect of albuterol maybe contributing to the tachycardia.   5) Guaifenasin  to help decrease thickened secretions.   6) Continue Chest PT, RT recommendations in regards to his respiration status.   2. Seizure Disorder - stable over the last 24 hrs.  1) Valproic acid level adequate at admission. Continue depakote home dosing.  Valproic Acid level 70 on admission.  2) Will speak to Dr. Sharene Skeans if has another seizure.  3) Will consider adding ativan 0.5 mg prn  3. 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 OSA - Continue venturi mask overnight due to desaturations.  He most likely desats at nights at home but he is on pulse ox continuous right now.    FEN/GI: Tube feeds per nutrition.  Home Prilosec.  Prophylaxis:  Lovenox 40 mg SQ Disposition: SDU given nonrebreather requirement Code Status: DNI  [name] R. Kristalyn Bergstresser, DO of [facility] 12/23/2011, 9:30 AM

## 2011-12-23 NOTE — Progress Notes (Signed)
Nutrition Follow-up  Intervention:   1.  Enteral nutrition; continue current management.  Continue MVI  Assessment:   Pt resting comfortably at time of visit.  No family at bedside.  Pt was resumed home regimen on admission with addition of MVI which pt is tolerating. Residuals:  0 mL at last documented check. Last BM (10/28)  Pt remains in step down due to respiratory distress.  Required venturi mask overnight.  Diet Order:  Osmolite 1.2 @ 37 mL/hr continuous per home regimen.  Meds: Scheduled Meds:   . antiseptic oral rinse  15 mL Mouth Rinse BID  . baclofen  20 mg Per Tube TID WC  . divalproex  250 mg Oral Daily  . divalproex  375 mg Oral BID  . enoxaparin (LOVENOX) injection  30 mg Subcutaneous Q24H  . free water  30 mL Per Tube Q2H  . gabapentin  300 mg Per Tube TID  . Gerhardt's butt cream  1 application Topical BID  . glycopyrrolate  1 mg Per Tube TID  . guaiFENesin  200 mg Per Tube Q6H  . levalbuterol  0.63 mg Nebulization Q4H  . levofloxacin  750 mg Per Tube Daily  . multivitamin  5 mL Per Tube Daily  . pantoprazole sodium  40 mg Per Tube Daily  . DISCONTD: enoxaparin (LOVENOX) injection  40 mg Subcutaneous Q24H   Continuous Infusions:   . feeding supplement (OSMOLITE 1.2 CAL) 1,000 mL (12/22/11 0056)   PRN Meds:.acetaminophen (TYLENOL) oral liquid 160 mg/5 mL, levalbuterol, nystatin cream   CMP     Component Value Date/Time   NA 137 12/23/2011 0710   K 3.9 12/23/2011 0710   CL 97 12/23/2011 0710   CO2 32 12/23/2011 0710   GLUCOSE 106* 12/23/2011 0710   BUN 8 12/23/2011 0710   CREATININE <0.20* 12/23/2011 0710   CALCIUM 9.4 12/23/2011 0710   GFRNONAA NOT CALCULATED 12/23/2011 0710   GFRAA NOT CALCULATED 12/23/2011 0710    CBG (last 3)  No results found for this basename: GLUCAP:3 in the last 72 hours   Intake/Output Summary (Last 24 hours) at 12/23/11 1214 Last data filed at 12/23/11 0400  Gross per 24 hour  Intake    915 ml  Output      0 ml    Net    915 ml    Weight Status:  94 lbs, stable  Re-estimated needs:  1000-1200 kcal, 36-45g protein  Nutrition Dx:  Inadequate oral intake r/t inability to eat, ongoing.  Not likely to resolve given medical condition and cognition/developemental delay  Monitor:   Goal: EN to meet >90% of estimated needs.  Monitor: TF tolerance, weights, labs   Loyce Dys, MS RD LDN Clinical Inpatient Dietitian Pager: [phone] Weekend/After hours pager: [phone]

## 2011-12-23 NOTE — Progress Notes (Signed)
RT spoke with pts mother.  Nasal trumpet placed with his mothers approval.  Nasal trumpet placed in left nare with no complications.

## 2011-12-23 NOTE — Progress Notes (Signed)
Name: [name] MRN: [id number] DOB: 01/11/1991    LOS: 6  Referring Provider:  Dr Sheffield Slider, FPTS Reason for Referral:  Respiratory distress, hypoxemia  PULMONARY / CRITICAL CARE MEDICINE  HPI:  21 yo man with cerebral palsy, MR, seizures, known to me from outpatient office. He was admitted to [facility] on 10/23 with few days of increased secretions, resp difficulty requiring albuterol (which he often does not require) and low grade fevers. He has been treated for possible HCAP. Course c/b increase freq seizures, some agitation associated with resp distress and desaturations.   Past Medical History  Diagnosis Date  . Allergy   . Asthma   . Cerebral palsy, quadriplegic   . Congenital CMV infection   . Neuromuscular disorder     cp  . H/O hiatal hernia   . Pneumonia     "he's had it several times; maybe now" (12/17/2011)  . Community acquired pneumonia 06/30/2011    Psuedomonas PNA 06/2011 >tx 21 d of Cipro  CXR 08/15/11 >clearance of PNA    . GERD (gastroesophageal reflux disease)   . Seizures     "has had gran mals before" (12/17/2011)  . Cytomegalovirus   . On home oxygen therapy     "at night" (12/17/2011"   Past Surgical History  Procedure Date  . Nissen fundoplication   . Rod ~ 2003 "or before"    "in his back" (12/17/2011   Prior to Admission medications   Medication Sig Start Date End Date Taking? Authorizing Provider  albuterol (PROVENTIL) (2.5 MG/3ML) 0.083% nebulizer solution Take 2.5 mg by nebulization every 6 (six) hours as needed. Shortness of breath   Yes Historical Provider, MD  artificial tears (LACRILUBE) OINT ophthalmic ointment Apply to eye every 4 (four) hours as needed. Please provide bottle used in hospital 07/07/11  Yes Andrena Mews, DO  baclofen (LIORESAL) 10 MG tablet TAKE 2 TABLETS INTO FEEDING TUBE 3 TIMES A DAY 10/19/11  Yes Shelly Rubenstein, MD  divalproex (DEPAKOTE SPRINKLE) 125 MG capsule 3 caps per tube at 6 am, 2 caps per tube at 2 pm, 3 caps per tube  at 9 pm    Yes Historical Provider, MD  Feeding Tubes - Pump (KANGAROO JOEY ENTERAL PUMP) MISC 30 Units by Does not apply route daily. Please provide bags for Kangroo Joey enteral feeding device. Should be changed daily. 10/20/11  Yes Shelly Rubenstein, MD  gabapentin (NEURONTIN) 300 MG capsule 1 tab per tube three times a day    Yes Historical Provider, MD  glycopyrrolate (ROBINUL) 1 MG tablet 1 TAB PER TUBE THREE TIMES A DAY FOR LUNG SECRETIONS 10/19/11  Yes Shelly Rubenstein, MD  Hydrocortisone (GERHARDT'S BUTT CREAM) CREA Apply 1 application topically 2 (two) times daily. Please provide remained of tube used in hospital 07/08/11  Yes Andrena Mews, DO  omeprazole (PRILOSEC) 2 mg/mL SUSP Take 10 mLs (20 mg total) by mouth daily at 12 noon. 12/11/11  Yes Shelly Rubenstein, MD  PROVENTIL HFA 108 [phone] BASE) MCG/ACT inhaler USE 2 PUFFS EVERY 4 HOURS AS NEEDED FOR WHEEZE 09/21/11  Yes Elsie [place], MD  diazepam (DIASTAT) 2.5 MG GEL Unknown dose. Prescribed by Dr. Sharene Skeans, neurology    Historical Provider, MD   Allergies Allergies  Allergen Reactions  . Sulfamethoxazole W-Trimethoprim Rash    "don't know how bad" /father 12/17/2011    Family History Family History  Problem Relation Age of Onset  . Allergies  Mother   . Heart disease Maternal Uncle   . Colon cancer Paternal Grandmother    Social History  reports that he has never smoked. He has never used smokeless tobacco. He reports that he does not drink alcohol or use illicit drugs.  Review Of Systems:  Unable to obtain  Events Since Admission:  Current Status: Guarded  Subjective:  Mother reports period of agitation this am with associated desaturation and tachypnea. Seemed to improve after bronchodilator  Vital Signs: Temp:  [97.4 F (36.3 C)-98.3 F (36.8 C)] 98.1 F (36.7 C) (10/29 1138) Pulse Rate:  [103-126] 118  (10/29 1138) Resp:  [14-29] 29  (10/29 1138) BP: (101-122)/(57-89) 112/57 mmHg (10/29 1138) SpO2:  [89  %-100 %] 100 % (10/29 1213) FiO2 (%):  [50 %-100 %] 100 % (10/29 1213)  Physical Examination: General:  Ill appearing, non-communcative Neuro:  Quadriplegia, some eye movements HEENT:  Some UA obstruction from tongue, disconjugate gaze Neck:  No overt stridor Cardiovascular:  Tachy, regular Lungs: using accessory muscles, bronchial BS on L, insp crackles. Some referred UA noise on the R Abdomen:  benign Musculoskeletal:  contracted Skin:  No wounds or rashes  Principal Problem:  *Community acquired pneumonia Active Problems:  MENTAL RETARDATION  SLEEP APNEA, OBSTRUCTIVE  CEREBRAL PALSY  SOB (shortness of breath)   Lab 12/23/11 0710 12/22/11 0505 12/21/11 1122 12/17/11 1315  NA 137 137 139 130*  K 3.9 3.3* -- --  CL 97 97 97 91*  CO2 32 37* 36* 27  GLUCOSE 106* 131* 135* 92  BUN 8 7 4* 7  CREATININE <0.20* 0.22* <0.20* 0.23*  CALCIUM 9.4 9.4 9.4 9.3  MG -- -- -- --  PHOS -- -- -- --    Lab 12/21/11 0445 12/18/11 0942 12/17/11 1315  HGB 13.2 14.2 14.6  HCT 39.6 42.7 41.6  WBC 4.6 9.0 9.5  PLT 121* 125* 151    Lab 12/20/11 0502  PHART 7.434  PCO2ART 50.3*  PO2ART 78.5*  HCO3 33.2*  O2SAT 96.5   Ventilator Settings: Vent Mode:  [-]  FiO2 (%):  [50 %-100 %] 100 %   Intake/Output Summary (Last 24 hours) at 12/23/11 1236 Last data filed at 12/23/11 0400  Gross per 24 hour  Intake    915 ml  Output      0 ml  Net    915 ml   CXR:  10/26 >> Possible LLL infiltrate, R clear  Impressions/Recommendations:   1. Acute respiratory failure due to probable infectious process (viral vs superimposed bacterial PNA) in a debilitated man with no pulmonary reserve - Agree with treating as possible HCAP even in absence of positive cx's, on levofloxacin with stabilization of WBC and fever.  Even with PCT of <0.1 would finish a course of levo (8 days) given aspiration and clinical condition. - If deteriorates from an infectious standpoint would be a candidate for broader  spectrum abx given very high aspiration risk (during succtioning today the patient has little to no ability to protect airway). - Aggressive pulmonary toilet - chest vest, positioning, suctioning as I have little doubt that aspiration is a large contributing factor here.  Mother finally agreed to a nasal trumpet for suction. - Mucinex; consider adding other decongestants such as chlorpheniramine or brompheniramine if they won't interact with his other medical regimen - Discussed via Dr. Delton Coombes, the pro's and con's of BiPAP with pt's mom. Would be appropriate to try if worsening distress to ease WOB. The drawbacks would be  increased aspiration risk, possible agitation.  - Code status noted, no intubation, BiPAP is not refused by mother.  2. Seizure disorder  3. OSA (not on CPAP outpt)  4. Cerebral Palsy  Will order a CXR in AM and continue to follow with you.  Alyson Reedy, M.D. [facility] Pulmonary/Critical Care Medicine. Pager: [phone]. After hours pager: [phone].

## 2011-12-24 DIAGNOSIS — J9620 Acute and chronic respiratory failure, unspecified whether with hypoxia or hypercapnia: Secondary | ICD-10-CM | POA: Diagnosis present

## 2011-12-24 HISTORY — DX: Acute and chronic respiratory failure, unspecified whether with hypoxia or hypercapnia: J96.20

## 2011-12-24 LAB — CBC WITH DIFFERENTIAL/PLATELET
Basophils Relative: 0 % (ref 0–1)
Eosinophils Absolute: 0.1 10*3/uL (ref 0.0–0.7)
Hemoglobin: 13 g/dL (ref 13.0–17.0)
Lymphs Abs: 2.2 10*3/uL (ref 0.7–4.0)
MCH: 30.7 pg (ref 26.0–34.0)
Monocytes Relative: 19 % — ABNORMAL HIGH (ref 3–12)
Neutro Abs: 3.6 10*3/uL (ref 1.7–7.7)
Neutrophils Relative %: 50 % (ref 43–77)
Platelets: 147 10*3/uL — ABNORMAL LOW (ref 150–400)
RBC: 4.24 MIL/uL (ref 4.22–5.81)

## 2011-12-24 LAB — CREATININE, SERUM: Creatinine, Ser: <0.2 mg/dL — ABNORMAL LOW (ref 0.50–1.35)

## 2011-12-24 NOTE — Progress Notes (Signed)
PGY-2 Daily Progress Note Family Medicine Teaching Service Service Pager: [phone]   Subjective/overnight: History obtained per overnight nurse and resident. Pt remained on nonrebreather  overnight and did well on this.  He had repeat CXR as nursing was concerned about the pt.    Objective:  VITALS Temp:  [98 F (36.7 C)-100 F (37.8 C)] 98 F (36.7 C) (10/30 0737) Pulse Rate:  [118-137] 128  (10/30 0737) Resp:  [18-29] 27  (10/30 0737) BP: (112-129)/(57-85) 124/60 mmHg (10/30 0737) SpO2:  [87 %-100 %] 96 % (10/30 0751) FiO2 (%):  [50 %-100 %] 50 % (10/29 2032)  In/Out  Intake/Output Summary (Last 24 hours) at 12/24/11 0951 Last data filed at 12/24/11 0600  Gross per 24 hour  Intake   1107 ml  Output      0 ml  Net   1107 ml    Physical Exam: General: NAD on non-rebreather mask HEENT: poor dentition, MMM, mildly red over face with slight diaphoresis Heart: S1, S2 normal, no murmur, rub or gallop, regular rate and rhythm  Lungs: no accessory muscle use, decreased breath sounds in LLL Abdomen: gastric tube in left periumbilical area, well healed midline incision  Extremities: atrophied with contractures  Skin:no rashes, no wounds    MEDS Scheduled Meds:    . antiseptic oral rinse  15 mL Mouth Rinse BID  . baclofen  20 mg Per Tube TID WC  . divalproex  250 mg Oral Daily  . divalproex  375 mg Oral BID  . enoxaparin (LOVENOX) injection  30 mg Subcutaneous Q24H  . free water  30 mL Per Tube Q2H  . gabapentin  300 mg Per Tube TID  . Gerhardt's butt cream  1 application Topical BID  . glycopyrrolate  1 mg Per Tube TID  . guaiFENesin  200 mg Per Tube Q6H  . levalbuterol  0.63 mg Nebulization Q4H  . levofloxacin  750 mg Per Tube Daily  . multivitamin  5 mL Per Tube Daily  . pantoprazole sodium  40 mg Per Tube Daily  . DISCONTD: enoxaparin (LOVENOX) injection  40 mg Subcutaneous Q24H   Continuous Infusions:    . feeding supplement (OSMOLITE 1.2 CAL) 1,000 mL  (12/22/11 0056)   PRN Meds:.acetaminophen (TYLENOL) oral liquid 160 mg/5 mL, levalbuterol, nystatin cream  Labs and imaging:   CBC  Lab 12/24/11 0845 12/21/11 0445 12/18/11 0942  WBC 7.2 4.6 9.0  HGB 13.0 13.2 14.2  HCT 39.6 39.6 42.7  PLT 147* 121* 125*   BMET/CMET  Lab 12/24/11 0500 12/23/11 0710 12/22/11 0505 12/21/11 1122  NA -- 137 137 139  K -- 3.9 3.3* 3.8  CL -- 97 97 97  CO2 -- 32 37* 36*  BUN -- 8 7 4*  CREATININE <0.20* <0.20* 0.22* --  CALCIUM -- 9.4 9.4 9.4  PROT -- -- -- --  BILITOT -- -- -- --  ALKPHOS -- -- -- --  ALT -- -- -- --  AST -- -- -- --  GLUCOSE -- 106* 131* 135*    Dg Chest Port 1 View 12/20/2011  IMPRESSION: Infiltration or atelectasis remains in the left lung base without change.   Original Report Authenticated By: [name], M.D.    DG CXR 12/21/11 IMPRESSION:  Bibasilar consolidation may represent infiltrates and / or  atelectasis possibly with small pleural effusions.  Pulmonary vascular prominence most notable centrally.   CXR 12/23/11 IMPRESSION:  Worsening of basilar opacities left greater than right most  consistent with  pneumonia.   Assessment  [name] is a 21 y.o. year old male with cerebral palsy, mental retardation, and seizure disorder presenting with increased work of breathing and hypoxemia concerning for viral vs. Bacterial pneumonia with likely mucus plugging causing desaturations    Plan:  1 Pneumonia (Aspiration vs CAP vs viral) - Pt presented to clinic on 10/23 with increased WOB and decreased saturations.   1) On day 7 of antibiotic. On Levaquin alone since 10/25. Increased work of breathing typically overnight and now on nonrebreather. Will try to switch him to venti mask and eventually O2 [place].  Keep saturations above 88%.  Pt afebrile overnight again.   2) Dr. Delton Coombes, his outpatient pulmonologist, is following pt.  Appreciate input. After speaking with Dr. Delton Coombes on 10/28,he will be in today to talk  to the family.    3) Xopenex 2 hrs PRN for SOB as well as scheduled q 4 hrs. Thought is to decrease the B agonist effect of albuterol maybe contributing to the tachycardia.   5) Guaifenasin  to help decrease thickened secretions.   6) Continue Chest PT, RT recommendations in regards to his respiration status.   7)Repeat CXR last PM may show worsening of LLL infiltrate.  Will get repeat CBC this AM and consider broadening ABx treatment if pt worsens.   2. Seizure Disorder - stable over the last 24 hrs.  1) Valproic acid level adequate at admission. Continue depakote home dosing.  Valproic Acid level 70 on admission.  2) Will speak to Dr. Sharene Skeans if has another seizure.  3) Will consider adding ativan 0.5 mg prn  3. 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 OSA - Continue nonrebreather mask overnight due to desaturations.  He most likely desats at nights at home but he is on pulse ox continuous right now.    FEN/GI: Tube feeds per nutrition.  Home Prilosec.  Prophylaxis:  Lovenox 40 mg SQ Disposition: SDU given nonrebreather requirement Code Status: DNI  [name] R. Paulina Fusi, DO of [facility] 12/24/2011, 8:32 AM

## 2011-12-24 NOTE — Progress Notes (Signed)
FMTS Attending Daily Note: Mays Paino MD [phone] pager office [phone] I  have seen and examined this patient, reviewed their chart. I have discussed this patient with the resident. I agree with the resident's findings, assessment and care plan. 

## 2011-12-24 NOTE — Progress Notes (Signed)
Name: BLONG PIKE MRN: [id number] DOB: Dec 26, 1990    LOS: 7  Referring Provider:  Dr Sheffield Slider, FPTS Reason for Referral:  Respiratory distress, hypoxemia  PULMONARY / CRITICAL CARE MEDICINE  HPI:  21 yo man with cerebral palsy, MR, seizures, known to me from outpatient office. He was admitted to [facility] on 10/23 with few days of increased secretions, resp difficulty requiring albuterol (which he often does not require) and low grade fevers. He has been treated for possible HCAP. Course c/b increase freq seizures, some agitation associated with resp distress and desaturations. No real improvement in oxygen needs or WOB despite supportive care and rx for suspected bacterial PNA  Current Status: Guarded  Subjective:  No real changes in oxygenation or secretions last 24-48 hours. Has had acute periods of agitation and desaturation  Vital Signs: Temp:  [97.8 F (36.6 C)-100 F (37.8 C)] 97.8 F (36.6 C) (10/30 1126) Pulse Rate:  [95-137] 95  (10/30 1126) Resp:  [18-28] 22  (10/30 1126) BP: (106-129)/(60-85) 106/71 mmHg (10/30 1126) SpO2:  [87 %-100 %] 97 % (10/30 1138) FiO2 (%):  [50 %-100 %] 100 % (10/30 1138)  Intake/Output Summary (Last 24 hours) at 12/24/11 1342 Last data filed at 12/24/11 0600  Gross per 24 hour  Intake    899 ml  Output      0 ml  Net    899 ml    Physical Examination: General:  Ill appearing, non-communcative Neuro:  Quadriplegia, some eye movements HEENT:  Some UA obstruction from tongue, disconjugate gaze Neck:  No overt stridor Cardiovascular:  Tachy, regular Lungs: using accessory muscles, bronchial BS on L, insp crackles. Some referred UA noise on the R Abdomen:  benign Musculoskeletal:  contracted Skin:  No wounds or rashes  Principal Problem:  *Community acquired pneumonia Active Problems:  MENTAL RETARDATION  SLEEP APNEA, OBSTRUCTIVE  CEREBRAL PALSY  SOB (shortness of breath)   Lab 12/24/11 0500 12/23/11 0710 12/22/11 0505 12/21/11 1122    NA -- 137 137 139  K -- 3.9 3.3* --  CL -- 97 97 97  CO2 -- 32 37* 36*  GLUCOSE -- 106* 131* 135*  BUN -- 8 7 4*  CREATININE <0.20* <0.20* 0.22* <0.20*  CALCIUM -- 9.4 9.4 9.4  MG -- -- -- --  PHOS -- -- -- --    Lab 12/24/11 0845 12/21/11 0445 12/18/11 0942  HGB 13.0 13.2 14.2  HCT 39.6 39.6 42.7  WBC 7.2 4.6 9.0  PLT 147* 121* 125*    Lab 12/20/11 0502  PHART 7.434  PCO2ART 50.3*  PO2ART 78.5*  HCO3 33.2*  O2SAT 96.5   Ventilator Settings: Vent Mode:  [-]  FiO2 (%):  [50 %-100 %] 100 %   Intake/Output Summary (Last 24 hours) at 12/24/11 1338 Last data filed at 12/24/11 0600  Gross per 24 hour  Intake    899 ml  Output      0 ml  Net    899 ml   CXR:  10/26 >> Possible LLL infiltrate, R clear  10/29 >> worsening LLL infiltrate vs volume loss, evolving R perihilar/ RML infiltrate  Impressions/Recommendations:   1. Acute respiratory failure due to probable infectious process (viral vs superimposed bacterial PNA) in a debilitated man with no pulmonary reserve. Has been afebrile without leukocytosis. If this is a viral process, time course will be 10-14 days. I spoke with the patient's mother today 10/30, explained that we are in supportive care mode and that although we  hope that he will rebound in the coming days but that it is possible that he may not recover from this illness. I do not believe he can go home or to SNF at this time with such high O2 needs and clinical instability, unless the move also represents a transition to comfort care. While his mother acknowledges that we may need to transition to comfort if Burnette cannot recover, she is not ready to do so at this time.  - finish empiric course abx - pulmonary toilet - no indication for steroids here, no wheezing on my exam - would reconsider BiPAP if he can tolerate >> there is high risk of aspiration, but he has significant LLL and R basilar atelectasis that are likely barriers to any improvement here.  -  Code status noted, no intubation, BiPAP is not refused by mother.  2. Seizure disorder  3. OSA (not on CPAP outpt)  4. Cerebral Palsy  Levy Pupa, MD, PhD 12/24/2011, 1:52 PM [place] [facility] [phone] or if no answer [phone]

## 2011-12-24 NOTE — Progress Notes (Signed)
Family Practice notified increase in patients heart rate 140-150's,  O2sats 92-95 on NRB. Will continue to monitor pt. Adria Dill RN

## 2011-12-24 NOTE — Progress Notes (Signed)
Clinical Social Worker staffed case with MD Paulina Fusi.  Per MD, pt's mother is not interested in SNF.  CSW encouraged MD to re consult CSW if any additional needs arise.  CSW to sign off, please re consult if needed.   Angelia Mould, MSW, [place] [phone]

## 2011-12-24 NOTE — Progress Notes (Signed)
Family Practice MD at bedside to evaluate patient. Will continue to monitor . Adria Dill RN

## 2011-12-25 NOTE — Progress Notes (Signed)
INTERN Daily Progress Note Family Medicine Teaching Service Service Pager: [phone]   Subjective/overnight: History obtained per overnight nurse. Pt remained on nonrebreather overnight and mother believes he is about the same.  Objective:  VITALS Temp:  [97.4 F (36.3 C)-98.2 F (36.8 C)] 98.2 F (36.8 C) (10/31 0753) Pulse Rate:  [95-126] 117  (10/31 0753) Resp:  [15-25] 25  (10/31 0753) BP: (104-117)/(61-87) 116/61 mmHg (10/31 0753) SpO2:  [97 %-100 %] 99 % (10/31 0753) FiO2 (%):  [75 %-100 %] 100 % (10/31 0527) Weight:  [98 lb 8.7 oz (44.7 kg)] 98 lb 8.7 oz (44.7 kg) (10/31 0035)  In/Out  Intake/Output Summary (Last 24 hours) at 12/25/11 0827 Last data filed at 12/25/11 0600  Gross per 24 hour  Intake    793 ml  Output      0 ml  Net    793 ml    Physical Exam: General: NAD on non-rebreather mask HEENT: poor dentition, MMM Heart: S1, S2 normal, no murmur Lungs: no accessory muscle use, decreased breath sounds in LLL Abdomen: gastric tube in left periumbilical area, well healed midline incision  Extremities: atrophied with contractures  Skin:no rashes, no wounds    MEDS Scheduled Meds:    . antiseptic oral rinse  15 mL Mouth Rinse BID  . baclofen  20 mg Per Tube TID WC  . divalproex  250 mg Oral Daily  . divalproex  375 mg Oral BID  . enoxaparin (LOVENOX) injection  30 mg Subcutaneous Q24H  . free water  30 mL Per Tube Q2H  . gabapentin  300 mg Per Tube TID  . Gerhardt's butt cream  1 application Topical BID  . glycopyrrolate  1 mg Per Tube TID  . guaiFENesin  200 mg Per Tube Q6H  . levalbuterol  0.63 mg Nebulization Q4H  . levofloxacin  750 mg Per Tube Daily  . multivitamin  5 mL Per Tube Daily  . pantoprazole sodium  40 mg Per Tube Daily   Continuous Infusions:    . feeding supplement (OSMOLITE 1.2 CAL) 1,000 mL (12/25/11 0403)   PRN Meds:.acetaminophen (TYLENOL) oral liquid 160 mg/5 mL, levalbuterol, nystatin cream  Labs and imaging:    CBC  Lab 12/24/11 0845 12/21/11 0445 12/18/11 0942  WBC 7.2 4.6 9.0  HGB 13.0 13.2 14.2  HCT 39.6 39.6 42.7  PLT 147* 121* 125*   BMET/CMET  Lab 12/24/11 0500 12/23/11 0710 12/22/11 0505 12/21/11 1122  NA -- 137 137 139  K -- 3.9 3.3* 3.8  CL -- 97 97 97  CO2 -- 32 37* 36*  BUN -- 8 7 4*  CREATININE <0.20* <0.20* 0.22* --  CALCIUM -- 9.4 9.4 9.4  PROT -- -- -- --  BILITOT -- -- -- --  ALKPHOS -- -- -- --  ALT -- -- -- --  AST -- -- -- --  GLUCOSE -- 106* 131* 135*    Dg Chest Port 1 View 12/20/2011  IMPRESSION: Infiltration or atelectasis remains in the left lung base without change.   Original Report Authenticated By: [name], M.D.    DG CXR 12/21/11 IMPRESSION:  Bibasilar consolidation may represent infiltrates and / or  atelectasis possibly with small pleural effusions.  Pulmonary vascular prominence most notable centrally.   CXR 12/23/11 IMPRESSION:  Worsening of basilar opacities left greater than right most  consistent with pneumonia.   Assessment  [name] is a 21 y.o. year old male with cerebral palsy, mental retardation, and seizure  disorder presenting with increased work of breathing and hypoxemia concerning for viral vs. Bacterial pneumonia with likely mucus plugging causing desaturations    Plan:  1 Pneumonia (Aspiration vs CAP vs viral) - Pt presented to clinic on 10/23 with increased WOB and decreased saturations.   1) On day 8/8 of antibiotic. Levaquin 750 mg.  Guaifenesin to decrease thickness of secretions.   2) Xopenex 2 hrs PRN for SOB as well as scheduled q 4 hrs.  6) Continue Chest PT, RT recommendations in regards to his respiration status.   7)Dr [name], pt's outpatient pulmonologist, evaluated patient yesterday and has the following recommendations.  1) Supportive Care, most likely viral process.  Poor pulmonary reserve, and high oxygen requirements.  2) Will keep in SDU pending his O2 requirements.  May need to  transition to comfort care if recovery does not improve  3) No steroids indicated at this time.  Would do BiPAP if he can tolerate this.  Risks and Benefits explained to mother about aspiration.    2. Seizure Disorder - stable   1) Valproic acid level adequate at admission. Continue depakote home dosing.  Valproic Acid level 70 on admission.  2) Will speak to Dr. Sharene [name] if has another seizure.  3) Will consider adding ativan 0.5 mg prn  3. 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 OSA - Continue nonrebreather mask overnight due to desaturations.  Consider BiPAP  FEN/GI: Tube feeds per nutrition.  Home Prilosec.  Prophylaxis:  Lovenox 40 mg SQ Disposition: SDU given nonrebreather requirement Code Status: DNI  [name] R. Paulina Fusi, DO of [facility] 12/25/2011, 8:17 AM

## 2011-12-25 NOTE — Progress Notes (Signed)
Name: [name] MRN: [id number] DOB: Jan 17, 1991    LOS: 8  Referring Provider:  Dr Sheffield Slider, FPTS Reason for Referral:  Respiratory distress, hypoxemia  PULMONARY / CRITICAL CARE MEDICINE  HPI:  21 yo man with cerebral palsy, MR, seizures, known to me from outpatient office. He was admitted to [facility] on 10/23 with few days of increased secretions, resp difficulty requiring albuterol (which he often does not require) and low grade fevers. He has been treated for possible HCAP. Course c/b increase freq seizures, some agitation associated with resp distress and desaturations. No real improvement in oxygen needs or WOB despite supportive care and rx for suspected bacterial PNA  Current Status: Guarded  Subjective:  No real changes in oxygenation or secretions last 24-48 hours. Has had acute periods of agitation and desaturation  Vital Signs: Temp:  [97.3 F (36.3 C)-98.2 F (36.8 C)] 97.3 F (36.3 C) (10/31 1243) Pulse Rate:  [105-126] 125  (10/31 1243) Resp:  [15-28] 28  (10/31 1243) BP: (104-117)/(61-87) 109/61 mmHg (10/31 1243) SpO2:  [97 %-100 %] 99 % (10/31 1243) FiO2 (%):  [75 %-100 %] 100 % (10/31 0527) Weight:  [44.7 kg (98 lb 8.7 oz)] 44.7 kg (98 lb 8.7 oz) (10/31 0035)  Intake/Output Summary (Last 24 hours) at 12/25/11 1328 Last data filed at 12/25/11 0900  Gross per 24 hour  Intake    860 ml  Output      0 ml  Net    860 ml    Physical Examination: General:  Ill appearing, non-communcative, coughing more today Neuro:  Quadriplegia, some eye movements HEENT:  Some UA obstruction from tongue, disconjugate gaze Neck:  No overt stridor Cardiovascular:  Tachy, regular Lungs: using accessory muscles, bronchial BS on L, insp crackles. Some referred UA noise on the R Abdomen:  benign Musculoskeletal:  contracted Skin:  No wounds or rashes  Principal Problem:  *Community acquired pneumonia Active Problems:  MENTAL RETARDATION  SLEEP APNEA, OBSTRUCTIVE  CEREBRAL  PALSY  SOB (shortness of breath)  Acute and chronic respiratory failure  Lab 12/24/11 0500 12/23/11 0710 12/22/11 0505 12/21/11 1122  NA -- 137 137 139  K -- 3.9 3.3* --  CL -- 97 97 97  CO2 -- 32 37* 36*  GLUCOSE -- 106* 131* 135*  BUN -- 8 7 4*  CREATININE <0.20* <0.20* 0.22* <0.20*  CALCIUM -- 9.4 9.4 9.4  MG -- -- -- --  PHOS -- -- -- --   Lab 12/24/11 0845 12/21/11 0445  HGB 13.0 13.2  HCT 39.6 39.6  WBC 7.2 4.6  PLT 147* 121*   Lab 12/20/11 0502  PHART 7.434  PCO2ART 50.3*  PO2ART 78.5*  HCO3 33.2*  O2SAT 96.5   Ventilator Settings: Vent Mode:  [-]  FiO2 (%):  [75 %-100 %] 100 %  Intake/Output Summary (Last 24 hours) at 12/25/11 1328 Last data filed at 12/25/11 0900  Gross per 24 hour  Intake    860 ml  Output      0 ml  Net    860 ml   CXR:  10/26 >> Possible LLL infiltrate, R clear  10/29 >> worsening LLL infiltrate vs volume loss, evolving R perihilar/ RML infiltrate  Impressions/Recommendations:   1. Acute respiratory failure due to probable infectious process (viral vs superimposed bacterial PNA) in a debilitated man with no pulmonary reserve. Has been afebrile without leukocytosis. If this is a viral process, time course will be 10-14 days.  Dr. Delton Coombes spoke with the  patient mother, she is aware that the patient is in supportive care only but transition to comfort care is a possibility if patient continues to fail to progress.  I do not foresee him going home on such high flow of O2.  Per Dr. Delton Coombes discussion with the family mother is not quite ready for transition to comfort care.  - Finish empiric course abx - Pulmonary toilet and vibravest. - No indication for steroids here, no wheezing on my exam - Would reconsider BiPAP if he can tolerate >> there is high risk of aspiration, but he has significant LLL and R basilar atelectasis that are likely barriers to any improvement here.  - Code status noted, no intubation, BiPAP is not refused by mother.  2.  Seizure disorder  3. OSA (not on CPAP outpt)  4. Cerebral Palsy  Alyson Reedy, M.D. [facility] Pulmonary/Critical Care Medicine. Pager: [phone]. After hours pager: [phone].

## 2011-12-25 NOTE — Clinical Social Work Psychosocial (Signed)
     Clinical Social Work Department BRIEF PSYCHOSOCIAL ASSESSMENT 12/25/2011  Patient:  [name], [name]     Account Number:  [id number]     Admit date:  12/17/2011  Clinical Social Worker:  Margaree Mackintosh  Date/Time:  12/25/2011 02:20 PM  Referred by:  Physician  Date Referred:  12/25/2011 Referred for  SNF Placement   Other Referral:   Interview type:  Family Other interview type:   Pt currently unable to fully particiapte in assessment due to medical condition.    PSYCHOSOCIAL DATA Living Status:  FAMILY Admitted from facility:   Level of care:   Primary support name:  [name]: [phone] Primary support relationship to patient:  PARENT Degree of support available:   Adequate.    CURRENT CONCERNS Current Concerns  Post-Acute Placement   Other Concerns:    SOCIAL WORK ASSESSMENT / PLAN Clinical Social Worker recieved rerferral to meet with pt's mother at bedside.  CSW reviewed chart and met with mother. CSW introduced self, explained role, and provided support. CSW provided extensive emotional support to mother.  CSW utilized solution focused intervention to validated mother's feelings and locate inherent strengths.  CSW introduced idea of post acute placement options and Palliative.  Mother agreeable to meet with [facility] and Palliative Medicine Team to discuss treatment options in more detail.  CSW staffed case with MD.  CSW made referral to [facility]T and [facility].  CSW to continue to follow and assist sa needed.   Assessment/plan status:  Information/Referral to [facility] Other assessment/ plan:   Information/referral to community resources:   Palliative Medicine Team  [facility]  CSW    PATIENTS/FAMILYS RESPONSE TO PLAN OF CARE: Pt currently unable to fully participate in assessment due to medical condition. Mother pleasant and engaged in conversation.

## 2011-12-25 NOTE — Plan of Care (Signed)
Problem: Phase II Progression Outcomes Goal: Tolerating diet Outcome: Progressing Pt is on tube feeding

## 2011-12-25 NOTE — Progress Notes (Signed)
Palliative Medicine Team consult for goals of care and discussion of placement options requested by Dr Hale/Dr Paulina Fusi; spoke with patient's mother [name] at bedside meeting time confirmed for tomorrow, Friday 12/26/11 @ 11:30-12noon  Valente David, RN 12/25/2011, 4:08 PM Palliative Medicine Team RN Liaison [phone]

## 2011-12-26 DIAGNOSIS — R52 Pain, unspecified: Secondary | ICD-10-CM

## 2011-12-26 DIAGNOSIS — K1370 Unspecified lesions of oral mucosa: Secondary | ICD-10-CM

## 2011-12-26 MED ORDER — ACETAMINOPHEN 160 MG/5ML PO SOLN
325.0000 mg | Freq: Four times a day (QID) | ORAL | Status: DC | PRN
  Filled 2011-12-26: qty 20.3

## 2011-12-26 MED ORDER — ACETAMINOPHEN 160 MG/5ML PO SOLN
650.0000 mg | Freq: Three times a day (TID) | ORAL | Status: DC
  Administered 2011-12-26 – 2012-01-05 (×31): 650 mg via ORAL
  Filled 2011-12-26 (×31): qty 20.3

## 2011-12-26 NOTE — Progress Notes (Signed)
Patient EA:VWUJWJ LENY ONDER      DOB: 1990/08/18      XBJ:[id number]  Full consult to follow.  Discussed with Resident team and Dr. Delton Coombes.  Patient's mom understands that Marik is at risk for death and poor outcome but does not feel that he is at the point of deciding on comfort care so early in his potential recovery period.  She will be reexamining her feelings about his partial code status as an exercise that is appropriate for his advancing age and frailty.  She would like to continue to optimize his pulmonary toilet and so we are requesting the following:  1.  Will order a Kinair bed which will assist in keeping his skin from breaking down and will turn him to help with secretions.  2.  Will change his vibravest treatments to q 4 so he can have them through the night and not be so cumulatively congested in the am.  3.  Will permit his mom and Respiratory to suction him orally with soft catheter which is what he is used to and permits.  He hates Yankauer.  4.  Will schedule some tylenol as he is likely having muscle aches and pains which is making him miserable.  5.  He has some skin breakdown in the fold of his right hand.  Will ask for nystatin to be applied daily.  6.  He had three loose stools if this continues would check a C. Diff  7. Please be conscious that Illiam communicates in the level and intensity of his moaning.  If he is really distressed he will escalate his cries.  His heart rate is a good guide to his level of distress.  Anything greater than 120 is likely discomfort or distress oriented.  8.  We will ask for [facility] to review his case to keep him within reach for Dr. Delton Coombes.  I am available to talk with the insurance company if that will help.   1130- 115  Pm.   Suhana Wilner L. Ladona Ridgel, MD MBA The Palliative Medicine Team at [facility] Phone: [phone] Pager: [phone]

## 2011-12-26 NOTE — Consult Note (Signed)
Patient WU:JWJXBJ B Vorce      DOB: 09/15/1990      YNW:[id number]     Consult Note from the Palliative Medicine Team at [facility]    Consult Requested by: Dr.  Frances Nickels al   PCP: [facility], Marylene Land, MD Reason for Consultation: Goals of care    Phone Number:[phone]  Assessment of patients Current state: Shows a 21 year old male with a known past medical history for cerebral palsy with severe cognitive deficits. Patient was admitted from the clinic with tachypnea and hypoxia. His chest x-ray and labs did not indicate a bacterial process it is therefore felt that he likely had a viral infection. Of note his father has come down with similar symptoms. The patient has been in the hospital for close to a week with no improvement in his hypoxia. The primary service and Dr. Delton Coombes felt that he was in a very tenuous state and therefore consulted palliative care to discuss the family's goals. At this time Angeldejesus's mother describes his baseline as being able to interact with his vocalizations and behavior. In general when he is happy he has a set of sounds that he will make that her current living in nature and distinct from the sounds of distress. 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 His mother states that her criteria for feeling that he could not longer have dignity or quality would be the following: If he were unable to mobilize a cough to assist in clearing his secretions, if he were "out of it" more than his baseline. Of note Hiroto does sleep a lot during the day but does have meaningful interaction at his school and surroundings when he is well. [name] states that also there would not be quality of life if he continuously moans in a manner that suggested he was uncomfortable. She states that she does not wish for him to be in pain and does recognize that he does have a limited life span. At this time [name] feels that we are not meeting  qualifications for lack of dignity or quality, like to get Aydrian more time to improve. Note some of the things that make Kiefer happy and content are when his head is rubbed or his chest is patted.  He does not like his be touched, and he really hates to have a Yankauer placed in his mouth for suctioning. His mother has been using a soft catheter to suction through the oropharynx and not through the nose at home successfully and request that we continue this during his hospital stay. At this time we did discuss the patient's partial CODE STATUS, [name] had decided they would never want [place] intubated but has not reviewed his status now that he is to come in adult as he has not had significant hospitalizations. She states his last hospitalization was several months to eat a year ago and it took him over week to improve but he did get back to baseline. We discussed the fact that as Tilford Pillar into his adult years he is more likely to sustain an injury that could require CPR and would place him back to baseline it would not be acceptable by the terms of dignity and quality that she is outlined. She therefore agrees to review this with her husband and may make considerations for full DO NOT RESUSCITATE.    Goals of Care: 1.  Code Status: CPR is still requested  in addition to medications for arrhythmia and vasopressors. The patient is not to be intubated at any time. She has not declining BiPAP at this time   2. Scope of Treatment: At this time [name] is requesting that continued aggressive pulmonary toilet be undertaken. If there are any treatments that would help him improve his pulmonary condition she would request those. She is concerned about skin breakdown, and indicates location on his right hand where he usually gets some skin breakdown as well. She would like to address his pain which may not be able to be verbalized we therefore have agreed to a scheduled dose of Tylenol. We did discuss the use of  opiates and how they would affect [name]. At this time she is not looking to initiate low-dose opiates but we were able to communicate how that affects breathing.   4. Disposition: [name] would like to consider an LTAC placement to give Blas time to improve his pulmonary issues. I've spoken with the care manager about potentially seeing if his insurance would allow him to be here at [facility] that Dr. Delton Coombes could see him. We may need to communicate with the medical directors at [facility] as a generally contact with [facility]. I have told [name] that [facility] would also provide that level of care but that Dr. Delton Coombes would not have access to assist her in 2 to decision-making if Makale did not improve. I would be glad to communicate with the medical directors at [facility] regarding the positive implications of having here [facility]   3. Symptom Management: 1. High risk for skin breakdown: Will order a Kinair bed which will assist in keeping his skin from breaking down and will turn him to help with secretions.  2. Pulmonary congestion: Will change his vibravest treatments to q 4 so he can have them through the night and not be so cumulatively congested in the am.  3. Aggressive pulmonary toilet :Will permit his mom and Respiratory to suction him orally with soft catheter which is what he is used to and permits. He hates Yankauer. The patient is currently on Robinul via his tube which was listed as a home medication for him if his secretions become too thickened we may need to decrease the dose in order for him to be able to mobilize those 4. Pain with turning Will schedule some tylenol as he is likely having muscle aches and pains which is making him miserable.  5. Skin breakdown in the fold of right palm He has some skin breakdown in the fold of his right hand. Will ask for nystatin to be applied daily.  6. Diarrhea :He had three loose stools if this continues would check a C. Diff  7. Inability  to communicate: Please be conscious that Lilburn communicates in the level and intensity of his moaning. If he is really distressed he will escalate his cries. His heart rate is a good guide to his level of distress. Anything greater than 120 is likely discomfort or distress oriented.  8. Dysphagia: Continue tube feedings. Please note that [name] will likely never feel comfortable discontinuing his feeds. If he progresses on to comfort care this issue will need to be discussed with hospice as it is considered a comfort measure for his parents.   4. Psychosocial: Jaquese has been cared for by multiple caregivers over the years currently his parents are his primary caregiver. He does go to his special school and according to his mother he responds  very favorably to music in particular the Dixie Chicks  5. Spiritual: the patient's family he has spiritual support from their church and atrial strength from this.         Patient Documents Completed or Given: Document Given Completed  Advanced Directives Pkt    MOST   patient has a completed form provided by his family but I gave him a blank to review for potential changes.   DNR    Gone from My Sight    Hard Choices      Brief HPI: Patient is a 21 year old white male with cerebral palsy who presented with hypoxia likely secondary to a viral pneumonia. The patient is having difficulty recovering to his baseline. His family feels that he needs more time to [place].    ROS: Unable     PMH:  Past Medical History  Diagnosis Date  . Allergy   . Asthma   . Cerebral palsy, quadriplegic   . Congenital CMV infection   . Neuromuscular disorder     cp  . H/O hiatal hernia   . Pneumonia     "he's had it several times; maybe now" (12/17/2011)  . Community acquired pneumonia 06/30/2011    Psuedomonas PNA 06/2011 >tx 21 d of Cipro  CXR 08/15/11 >clearance of PNA    . GERD (gastroesophageal reflux disease)   . Seizures     "has had gran mals before"  (12/17/2011)  . Cytomegalovirus   . On home oxygen therapy     "at night" (12/17/2011"     PSH: Past Surgical History  Procedure Date  . Nissen fundoplication   . Rod ~ 2003 "or before"    "in his back" (12/17/2011   I have reviewed the FH and SH and  If appropriate update it with new information. Allergies  Allergen Reactions  . Sulfamethoxazole W-Trimethoprim Rash    "don't know how bad" /father 12/17/2011   Scheduled Meds:   . antiseptic oral rinse  15 mL Mouth Rinse BID  . baclofen  20 mg Per Tube TID WC  . divalproex  250 mg Oral Daily  . divalproex  375 mg Oral BID  . enoxaparin (LOVENOX) injection  30 mg Subcutaneous Q24H  . free water  30 mL Per Tube Q2H  . gabapentin  300 mg Per Tube TID  . Gerhardt's butt cream  1 application Topical BID  . glycopyrrolate  1 mg Per Tube TID  . guaiFENesin  200 mg Per Tube Q6H  . levalbuterol  0.63 mg Nebulization Q4H  . multivitamin  5 mL Per Tube Daily  . pantoprazole sodium  40 mg Per Tube Daily   Continuous Infusions:   . feeding supplement (OSMOLITE 1.2 CAL) 1,000 mL (12/25/11 0403)   PRN Meds:.acetaminophen (TYLENOL) oral liquid 160 mg/5 mL, levalbuterol, nystatin cream    BP 160/70  Pulse 105  Temp 97.7 F (36.5 C) (Oral)  Resp 20  Ht 4' 9.87" (1.47 m)  Wt 44.7 kg (98 lb 8.7 oz)  BMI 20.69 kg/m2  SpO2 97%   PPS: 10%    Intake/Output Summary (Last 24 hours) at 12/26/11 1430 Last data filed at 12/26/11 0800  Gross per 24 hour  Intake    726 ml  Output      0 ml  Net    726 ml   LBM:      3 time soft stool  Stool Softner: None  Physical Exam:  General: blind but with eyes open acknowledging that someone notice in the room. The patient softly moaning he have to smiles when his head is rubbed or his chest is patted HEENT:  Pupils are equal and round he does have some disconjugate gaze Chest:   Coarse rhonchi, no wheezing are present no use of accessory muscles [id number] positive S1  and S2  Abdomen: Soft PEG tube in place  Ext: contracted with an area of peeling skin in the right palmar crease, and no other ones are appreciated  Neuro: Impaired secondary to severe cognitive damage and cerebral palsy   Labs: CBC    Component Value Date/Time   WBC 7.2 12/24/2011 0845   RBC 4.24 12/24/2011 0845   HGB 13.0 12/24/2011 0845   HCT 39.6 12/24/2011 0845   PLT 147* 12/24/2011 0845   MCV 93.4 12/24/2011 0845   MCH 30.7 12/24/2011 0845   MCHC 32.8 12/24/2011 0845   RDW 13.3 12/24/2011 0845   LYMPHSABS 2.2 12/24/2011 0845   MONOABS 1.4* 12/24/2011 0845   EOSABS 0.1 12/24/2011 0845   BASOSABS 0.0 12/24/2011 0845    BMET    Component Value Date/Time   NA 137 12/23/2011 0710   K 3.9 12/23/2011 0710   CL 97 12/23/2011 0710   CO2 32 12/23/2011 0710   GLUCOSE 106* 12/23/2011 0710   BUN 8 12/23/2011 0710   CREATININE <0.20* 12/24/2011 0500   CALCIUM 9.4 12/23/2011 0710   GFRNONAA NOT CALCULATED 12/24/2011 0500   GFRAA NOT CALCULATED 12/24/2011 0500    CMP     Component Value Date/Time   NA 137 12/23/2011 0710   K 3.9 12/23/2011 0710   CL 97 12/23/2011 0710   CO2 32 12/23/2011 0710   GLUCOSE 106* 12/23/2011 0710   BUN 8 12/23/2011 0710   CREATININE <0.20* 12/24/2011 0500   CALCIUM 9.4 12/23/2011 0710   GFRNONAA NOT CALCULATED 12/24/2011 0500   GFRAA NOT CALCULATED 12/24/2011 0500    Chest Xray Reviewed/Impressions: Bibasilar opacities increasing since previous day. Hardware is present in the spine    Time In Time Out Total Time Spent with Patient Total Overall Time   11:30 AM   1:15 PM   25 minutes   105 minutes     Greater than 50%  of this time was spent counseling and coordinating care related to the above assessment and plan.   case was discussed with the resident, and Dr. Delton Coombes.   Marci Polito L. Ladona Ridgel, MD MBA The Palliative Medicine Team at [facility] Phone: [phone] Pager: [phone]

## 2011-12-26 NOTE — Progress Notes (Signed)
FMTS Attending Daily Note: Todd Levy MD [phone] pager office [phone] I  have seen and examined this patient, reviewed their chart. I have discussed this patient with the resident. I agree with the resident's findings, assessment and care plan. I very much appreciate Dr. Kavin Leech continued care (pulmonary) and also Dr Lubertha Basque consult and advice (palliative). Her recommendations are very helpful and I think her discussion with Mom was also extremely helpful.

## 2011-12-26 NOTE — Progress Notes (Signed)
FMTS Attending Daily Note: Garrie Elenes MD [phone] pager office [phone] I  have seen and examined this patient, reviewed their chart. I have discussed this patient with the resident. I agree with the resident's findings, assessment and care plan. 

## 2011-12-26 NOTE — Progress Notes (Signed)
FMTS Daily Intern Progress Note  Subjective: No acute events overnight - pt did well with no desaturations.  I have reviewed the patient's medications.  Objective Temp:  [97.7 F (36.5 C)-98.6 F (37 C)] 97.7 F (36.5 C) (11/01 1140) Pulse Rate:  [92-140] 105  (11/01 1140) Resp:  [15-35] 20  (11/01 1140) BP: (100-160)/(64-98) 160/70 mmHg (11/01 0843) SpO2:  [93 %-100 %] 97 % (11/01 1227) FiO2 (%):  [100 %] 100 % (11/01 1227)   Intake/Output Summary (Last 24 hours) at 12/26/11 1509 Last data filed at 12/26/11 0800  Gross per 24 hour  Intake    689 ml  Output      0 ml  Net    689 ml   General: NAD, lying in bed HEENT: Normocephalic, sclera clear, MMM CV: RRR, no murmurs, rubs, gallops; 1+ bilateral DP pulses Pulm: Poor effort, shallow breaths, breathing mask on, upper airway breath sounds transmitted into lungs Abd: Soft, nondistended Ext: Contractured arms and legs, no edema or cyanosis Neuro: Awake, eyes open, occasional jerks and vocal grunts which appears to be near patient's baseline  Labs and Imaging  Lab 12/24/11 0845 12/21/11 0445  WBC 7.2 4.6  HGB 13.0 13.2  HCT 39.6 39.6  PLT 147* 121*     Lab 12/24/11 0500 12/23/11 0710 12/22/11 0505 12/21/11 1122  NA -- 137 137 139  K -- 3.9 3.3* 3.8  CL -- 97 97 97  CO2 -- 32 37* 36*  BUN -- 8 7 4*  CREATININE <0.20* <0.20* 0.22* --  LABGLOM -- -- -- --  GLUCOSE -- 106* -- --  CALCIUM -- 9.4 9.4 9.4    Assessment and Plan CARLETON SCHNIPKE is a 21 y.o. year old male with cerebral palsy, mental retardation, and seizure disorder presenting with increased work of breathing and hypoxemia concerning for viral vs. bacterial pneumonia with likely mucus plugging causing desaturations  Plan:  1. Pneumonia (Aspiration vs CAP vs viral) - Pt presented to clinic on 10/23 with increased WOB and decreased saturations.  - Completed 8 days antibiotics with levaquin 750 mg.  - Guaifenesin to decrease thickness of secretions.  -  Xopenex 2 hrs PRN for SOB as well as scheduled q 4 hrs.  - Continue Chest PT, RT recommendations in regards to his respiration status.  - Dr Delton Coombes, pt's outpatient pulmonologist, evaluated patient 10/30 and has the following recommendations.       1) Supportive Care, most likely viral process. Poor pulmonary reserve, and high oxygen requirements.       2) Will keep in stepdown unit pending his O2 requirements. May need to transition to comfort care if recovery does not improve, likely to LTAC.       3) No steroids indicated at this time. Would do BiPAP if he can tolerate this. Risks and Benefits explained to mother about aspiration.  - Dr. Ladona Ridgel with Palliative Care met with mom and Alyis today.  Mom seems on board with idea of LTAC.  Greatly appreciate recommendations.  See note for full details.  Briefly,    1) [facility] by [facility] Care as patient will have pulmonology followup there by Dr. Delton Coombes.  Henrietta calling them and will evaluate and hear from them Monday at the earliest    2) Tylenol q8hr scheduled to help with discomfort that patient cannot articulate    3) Pulmonary toilet q4 hours including overnight and mom to help suction with oral suction (not yank suction as pt does  not like this)    4) Special bed to help prevent bed sores     5) consider checking c. Difficile if continues having diarrhea    6) Continue to discuss code status sensitively but knowing that mom understands Aniello has decreased life expectancy.  Help her to start considering decision regarding goals of care as Karrington transitions into adulthood, including what resuscitation measures mom would want, if mom wants to continue home tube feeds, etc.  2. Seizure Disorder - stable  - Valproic acid level adequate at admission. Continue depakote home dosing. Valproic Acid level 70 on admission.  - If another seizure, will speak to Dr. 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 OSA - Continue nonrebreather mask overnight due to desaturations. Consider BiPAP   FEN/GI: Tube feeds per nutrition. Home Prilosec.  - c. Difficile PCR if more diarrhea Prophylaxis: Lovenox 40 mg SQ  Disposition: SDU given nonrebreather requirement  Code Status: DNI; Very much appreciate consult by pallitative care Dr. Ladona Ridgel, who began discussion with mom regarding further code status decisions in patient transitioning to adulthood with MR/CP  Simone Curia Pager: [phone] 12/26/2011, 3:09 PM

## 2011-12-26 NOTE — Progress Notes (Signed)
Name: [name] MRN: [id number] DOB: [birth year]    LOS: 9  Referring Provider:  Dr Sheffield Slider, FPTS Reason for Referral:  Respiratory distress, hypoxemia  PULMONARY / CRITICAL CARE MEDICINE  HPI:  21 yo man with cerebral palsy, MR, seizures, known to me from outpatient office. He was admitted to [facility] on 10/23 with few days of increased secretions, resp difficulty requiring albuterol (which he often does not require) and low grade fevers. He has been treated for possible HCAP. Course c/b increase freq seizures, some agitation associated with resp distress and desaturations. No real improvement in oxygen needs or WOB despite supportive care and rx for suspected bacterial PNA  Current Status: Guarded  Subjective:  No real changes in oxygenation or secretions last 24-48 hours. Has had acute periods of agitation and desaturation  Vital Signs: Temp:  [97.3 F (36.3 C)-98.6 F (37 C)] 97.7 F (36.5 C) (11/01 1140) Pulse Rate:  [92-140] 105  (11/01 1140) Resp:  [15-35] 20  (11/01 1140) BP: (100-160)/(61-98) 160/70 mmHg (11/01 0843) SpO2:  [93 %-100 %] 100 % (11/01 1140) FiO2 (%):  [100 %] 100 % (11/01 1140)  Intake/Output Summary (Last 24 hours) at 12/26/11 1225 Last data filed at 12/26/11 0800  Gross per 24 hour  Intake    830 ml  Output      0 ml  Net    830 ml    Physical Examination: General:  Ill appearing, non-communcative, coughing more today Neuro:  Quadriplegia, some eye movements HEENT:  Some UA obstruction from tongue, disconjugate gaze Neck:  No overt stridor Cardiovascular:  Tachy, regular Lungs: using accessory muscles, bronchial BS on L, insp crackles. Some referred UA noise on the R Abdomen:  benign Musculoskeletal:  contracted Skin:  No wounds or rashes  Principal Problem:  *Community acquired pneumonia Active Problems:  MENTAL RETARDATION  SLEEP APNEA, OBSTRUCTIVE  CEREBRAL PALSY  SOB (shortness of breath)  Acute and chronic respiratory failure   Lab  12/24/11 0500 12/23/11 0710 12/22/11 0505 12/21/11 1122  NA -- 137 137 139  K -- 3.9 3.3* --  CL -- 97 97 97  CO2 -- 32 37* 36*  GLUCOSE -- 106* 131* 135*  BUN -- 8 7 4*  CREATININE <0.20* <0.20* 0.22* <0.20*  CALCIUM -- 9.4 9.4 9.4  MG -- -- -- --  PHOS -- -- -- --    Lab 12/24/11 0845 12/21/11 0445  HGB 13.0 13.2  HCT 39.6 39.6  WBC 7.2 4.6  PLT 147* 121*    Lab 12/20/11 0502  PHART 7.434  PCO2ART 50.3*  PO2ART 78.5*  HCO3 33.2*  O2SAT 96.5   Ventilator Settings: Vent Mode:  [-]  FiO2 (%):  [100 %] 100 %  Intake/Output Summary (Last 24 hours) at 12/26/11 1225 Last data filed at 12/26/11 0800  Gross per 24 hour  Intake    830 ml  Output      0 ml  Net    830 ml   CXR:  10/26 >> Possible LLL infiltrate, R clear  10/29 >> worsening LLL infiltrate vs volume loss, evolving R perihilar/ RML infiltrate  Impressions/Recommendations:   1. Acute respiratory failure due to probable infectious process (viral vs superimposed bacterial PNA) in a debilitated man with no pulmonary reserve. Has been afebrile without leukocytosis. If this is a viral process, time course will be 10-14 days.  Dr. Delton Coombes spoke with the patient mother, she is aware that the patient is in supportive care only but transition  to comfort care is a possibility if patient continues to fail to progress.  I do not foresee him going home on such high flow of O2.  Per Dr. Delton Coombes discussion with the family mother is not quite ready for transition to comfort care.  - Finish empiric course abx - Pulmonary toilet and vibravest. - No indication for steroids here, no wheezing on my exam - Would reconsider BiPAP if he can tolerate >> there is high risk of aspiration, but he has significant LLL and R basilar atelectasis that are likely barriers to any improvement here.  - Code status noted, no intubation, BiPAP is not refused by mother.  2. Seizure disorder  3. OSA (not on CPAP outpt)  4. Cerebral Palsy  Palliative  care consult today is perfectly appropriate, I simply do not see any reasonable chance that the patient can safely make it home or to a facility unless they can provide the high level of O2 that the patient is requiring.  Alyson Reedy, M.D. [facility] Pulmonary/Critical Care Medicine. Pager: [phone]. After hours pager: [phone].

## 2011-12-27 DIAGNOSIS — R52 Pain, unspecified: Secondary | ICD-10-CM

## 2011-12-27 DIAGNOSIS — K117 Disturbances of salivary secretion: Secondary | ICD-10-CM

## 2011-12-27 LAB — CLOSTRIDIUM DIFFICILE BY PCR: Toxigenic C. Difficile by PCR: NEGATIVE

## 2011-12-27 MED ORDER — NAPHAZOLINE HCL 0.1 % OP SOLN
1.0000 [drp] | Freq: Four times a day (QID) | OPHTHALMIC | Status: DC | PRN
  Administered 2011-12-27 – 2012-01-02 (×5): 1 [drp] via OPHTHALMIC
  Filled 2011-12-27: qty 15

## 2011-12-27 MED ORDER — GLYCOPYRROLATE 1 MG PO TABS
1.0000 mg | ORAL_TABLET | Freq: Two times a day (BID) | ORAL | Status: DC
  Administered 2011-12-27 – 2012-01-05 (×18): 1 mg
  Filled 2011-12-27 (×20): qty 1

## 2011-12-27 NOTE — Progress Notes (Signed)
Pt has been on NRB, placed on Venti mask at 12:51 at 50% to see if we can wean his O2. Vitals are stable. HR 105, RR 14. Oxygen Saturation is 99%. Pt seems to be comfortable in no distress. Will continue to monitor.

## 2011-12-27 NOTE — Progress Notes (Signed)
[name]      DOB: 1990/06/04      VWU:[id number]   Palliative Medicine Team at [facility] Progress Note    Subjective: [name] looks much more at peace today.  He has soft moaning which is only intermittent , not as continuous as yesterday.  Mom reports his buttocks is more red today and he had several loose stools.  [name] feels he is resting better.    Filed Vitals:   12/27/11 1220  BP:   Pulse: 106  Temp:   Resp: 17   Physical exam:  General: no acute distress.  Intermittent not continuous moaning.  Looks more comfortable PERRL with dysconjugate gaze Chest: less rhonchorus than yesterday,  Mom reports loose cough persisting CVS: tachycardic but not excessive Abd: soft, no grimace with palpation, peg tube slightly red, no smell Ext:  Contracted, right hand looks dry Neuro: cognitive impairment at baseline,  Some seizure activity noted by mom yesterday.    Assessment and plan: 21 year old white male with cerebral palsy, and cognitive impairment, as well as cortical blindness, admitted with URI hypoxia felt to have a viral infection.  Mom reports more comfortable today, smiled at the sound of his brothers voice last night.  Plan to continue aggressive pulmonary toilet and the tincture of time.  LTAC being considered.  1.  DNI,  But expecting CPR. 2.  Respiratory failure secondary to increased secretions/ viral illness:  Decrease robinul to bid to see if this helps mobilize secretions,  If oral secretions do not increase significantly might consider decreasing them one more time to just q day  To help mobilize secretions. 3.  Dysphagia: tolerating peg feeds, but tube slow.  May need replacement before going home. 4. Red buttocks.. But balm and aggressive changing and cleaning is going to be necessary for this patient.  We changed his bed to a kinar which he seems to like.  5. Disposition:  Possible LTAC placement.    Time In Time Out Total Time Spent with  Patient Total Overall Time  1155 am 1226 am 25 min 31 min   [name] L. Ladona Ridgel, MD MBA The Palliative Medicine Team at [facility] Phone: [phone] Pager: [phone]

## 2011-12-27 NOTE — Progress Notes (Signed)
FMTS Daily Resident Progress Note  Subjective: remains on non-rebreather at 100% FIO2. Mother saw him gag one time, this is not abnormal usually it is after meds given.  Mother states he slept well and did well after am breathing treatment today.  I have reviewed the patient's medications.  Objective Temp:  [97.3 F (36.3 C)-98.5 F (36.9 C)] 98.5 F (36.9 C) (11/02 0724) Pulse Rate:  [92-121] 117  (11/02 0724) Resp:  [15-24] 19  (11/02 0724) BP: (93-160)/(54-70) 93/54 mmHg (11/02 0724) SpO2:  [96 %-100 %] 96 % (11/02 0724) FiO2 (%):  [100 %] 100 % (11/02 0522) Weight:  [95 lb 14.4 oz (43.5 kg)] 95 lb 14.4 oz (43.5 kg) (11/02 0504)   Intake/Output Summary (Last 24 hours) at 12/27/11 0800 Last data filed at 12/27/11 0600  Gross per 24 hour  Intake   1364 ml  Output      0 ml  Net   1364 ml   General: NAD, lying in bed HEENT: Normocephalic, sclera clear, MMM CV: tachycardia, regular rhythm. no murmurs, rubs, gallops;  Pulm: major upper airway breath sounds transmitted into lungs; diminished in left base. Using supraclavicular accessory muscles.  Abd: Soft, nondistended, nontender. Ext: Contractured arms and legs, no edema or cyanosis Neuro: Awake, eyes open, appears comfortable today. Labs and Imaging  Lab 12/24/11 0845 12/21/11 0445  WBC 7.2 4.6  HGB 13.0 13.2  HCT 39.6 39.6  PLT 147* 121*     Lab 12/24/11 0500 12/23/11 0710 12/22/11 0505 12/21/11 1122  NA -- 137 137 139  K -- 3.9 3.3* 3.8  CL -- 97 97 97  CO2 -- 32 37* 36*  BUN -- 8 7 4*  CREATININE <0.20* <0.20* 0.22* --  LABGLOM -- -- -- --  GLUCOSE -- 106* -- --  CALCIUM -- 9.4 9.4 9.4    Assessment and Plan [name] is a 21 y.o. year old male with cerebral palsy, mental retardation, and seizure disorder with persistent hypoxemia/acute respiratory failure secondary to viral vs. bacterial pneumonia, refractory to antibiotics and supportive care.   Plan:  1. Pneumonia (Aspiration vs CAP vs  viral)/acute respiratory failure - stable, without improvement in O2 req. Reports that patient heart rate increases up to 160s when attempts to wean O2. - Completed 8 days antibiotics with levaquin 750 mg 12/26/11.  - Guaifenesin to decrease thickness of secretions.  - Xopenex 2 hrs PRN for SOB as well as scheduled q 4 hrs.  - Continue Chest PT, RT recommendations in regards to his respiration status.  - Greatly appreciated Dr Delton Coombes and pulmonology teams input regarding patient's poor reserve capacity. Will continue supportive care and consider options including LTAC. [facility]. - Also greatly appreciate Dr. Ladona Ridgel with Palliative Care meeting with pt and mother to introduce idea of long term planning and goals of care. Noted the detailed planning and new orders initiated with scheduled tylenol.  2. Seizure Disorder - stable  - Continue depakote home dosing. Valproic Acid level 70 on admission.  - If another seizure, will speak to Dr. 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 OSA - Continue nonrebreather mask overnight due to desaturations. Consider BiPAP   5. Loose stools.  - check c. Difficile PCR  FEN/GI: Tube feeds per nutrition. Home Prilosec.   Prophylaxis: Lovenox 40 mg SQ   Disposition: SDU given nonrebreather requirement; continue discussion regarding LTAC placement if failure to  improve O2 requirements.  Code Status: DNI; Very much appreciate consult by pallitative care Dr. Ladona Ridgel, who began discussion with mom regarding further code status decisions in patient transitioning to adulthood with MR/CP  [facility] PGY-3 Pager: [phone] 12/27/2011, 8:00 AM

## 2011-12-27 NOTE — Progress Notes (Signed)
FMTS Attending Daily Note: Denny Levy MD [phone] pager office [phone] I  have seen and examined this patient, reviewed their chart. His breathing is a little less ronchorous today. I have discussed this patient with the resident. I agree with the resident's findings, assessment and care plan. I spent some time talking with Mom who is incredibly reasonable.

## 2011-12-28 LAB — CBC
Hemoglobin: 14.2 g/dL (ref 13.0–17.0)
Platelets: 407 10*3/uL — ABNORMAL HIGH (ref 150–400)
RBC: 4.54 MIL/uL (ref 4.22–5.81)
WBC: 10.3 10*3/uL (ref 4.0–10.5)

## 2011-12-28 LAB — BASIC METABOLIC PANEL
CO2: 31 mEq/L (ref 19–32)
Calcium: 10 mg/dL (ref 8.4–10.5)
Chloride: 98 mEq/L (ref 96–112)
Glucose, Bld: 102 mg/dL — ABNORMAL HIGH (ref 70–99)
Potassium: 4.8 mEq/L (ref 3.5–5.1)
Sodium: 135 mEq/L (ref 135–145)

## 2011-12-28 MED ORDER — GABAPENTIN 300 MG PO CAPS
300.0000 mg | ORAL_CAPSULE | ORAL | Status: DC
  Administered 2011-12-28 – 2012-01-05 (×24): 300 mg
  Filled 2011-12-28 (×27): qty 1

## 2011-12-28 MED ORDER — BACID PO TABS
2.0000 | ORAL_TABLET | Freq: Three times a day (TID) | ORAL | Status: DC
  Administered 2011-12-28: 2
  Filled 2011-12-28 (×4): qty 2

## 2011-12-28 NOTE — Progress Notes (Signed)
PGY-1 Update Note  Spoke with Dr. Ladona Ridgel of palliative care team. She has spoke with Alie's mother, and some clarification/decisions have been made for resuscitation considerations. Currently, the desire is for NO intubation, chest compressions, or defibrillation/cardioversion in a cardiorespiratory arrest/PEA code-type situation; however, if pt has arrhythmia that can be treated with medications or cardioversion (e.g., afib, bradycardia, hemodynamically stable SVT, etc), mother currently would desire mediations/cardioversion. Dr. Ladona Ridgel stated she will clarify within orders as much as possible.  Will continue current management as per Dr. Ernest Haber note today, as well as continue to follow up with possible LTAC placement. Palliative care team's assistance is greatly appreciated.  Bobbye Morton, MD PGY-1, [facility] Family Medicine FPTS Intern pager: [phone]

## 2011-12-28 NOTE — Progress Notes (Signed)
FMTS Daily Resident Progress Note  Subjective:  Has remained on Venti mask at 50% FiO2 since noon yesterday. Has some tachycardia up to 130s at times but appears comfortable.  I have reviewed the patient's medications.  Objective Temp:  [96.4 F (35.8 C)-98.5 F (36.9 C)] 96.4 F (35.8 C) (11/03 0355) Pulse Rate:  [77-125] 125  (11/03 0355) Resp:  [10-24] 17  (11/03 0355) BP: (93-112)/(54-79) 104/67 mmHg (11/03 0355) SpO2:  [96 %-100 %] 97 % (11/03 0355) FiO2 (%):  [50 %-100 %] 50 % (11/03 0334) Weight:  [96 lb 5.5 oz (43.7 kg)] 96 lb 5.5 oz (43.7 kg) (11/03 0355)   Intake/Output Summary (Last 24 hours) at 12/28/11 0520 Last data filed at 12/28/11 0200  Gross per 24 hour  Intake   1172 ml  Output      0 ml  Net   1172 ml   General: NAD, lying in bed HEENT: Normocephalic, sclera clear, MMM CV: tachycardia, regular rhythm. no murmurs, rubs, gallops;  Pulm: major upper airway breath sounds transmitted into lungs; diminished in left base. Using supraclavicular accessory muscles intermittently.  Abd: Soft, nondistended, nontender. Ext: Contractured arms and legs, no edema or cyanosis Neuro: Awake, eyes open, appears comfortable today. Labs and Imaging  Lab 12/28/11 0500 12/24/11 0845  WBC 10.3 7.2  HGB 14.2 13.0  HCT 42.1 39.6  PLT 407* 147*     Lab 12/28/11 0500 12/24/11 0500 12/23/11 0710 12/22/11 0505  NA 135 -- 137 137  K 4.8 -- 3.9 3.3*  CL 98 -- 97 97  CO2 31 -- 32 37*  BUN 8 -- 8 7  CREATININE <0.20* <0.20* <0.20* --  LABGLOM -- -- -- --  GLUCOSE 102* -- -- --  CALCIUM 10.0 -- 9.4 9.4   Negative C diff toxin.  Assessment and Plan [name] is a 21 y.o. year old male with cerebral palsy, mental retardation, and seizure disorder with persistent hypoxemia/acute respiratory failure secondary to viral vs. bacterial pneumonia.  Plan:  1. Pneumonia (Aspiration vs CAP vs viral)/acute respiratory failure - Has first sign of improvement in O2 req to 50%  FIO2. Still experiencing fluctuating tachycardia.   - Completed 8 days antibiotics with levaquin 750 mg 12/26/11.  - Guaifenesin to decrease thickness of secretions. -decreased dose of robinul (home med).   - Xopenex q4/2 hrs PRN for SOB - Continue Chest PT, RT recommendations and O2 weaning finally showing some progress. - Greatly appreciated Dr Delton Coombes and pulmonology teams input regarding patient's poor reserve capacity. Will continue supportive care and consider options including LTAC. [facility]. - Also greatly appreciate Dr. Ladona Ridgel with Palliative Care meeting with pt and mother to introduce idea of long term planning and goals of care. Noted the detailed planning and new orders initiated with scheduled tylenol.  2. Seizure Disorder - stable  - Continue depakote home dosing. Valproic Acid level 70 on admission.  - If another seizure, will speak to Dr. 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 OSA - Nonrebreather mask weaned to Venti mask at 50% FiO2. Consider Bipap if worsens.  5. Loose stools.  -negative c. Difficile PCR  FEN/GI: Tube feeds per nutrition. Home Prilosec.   Prophylaxis: Lovenox 40 mg SQ   Disposition: SDU while high flow O2 required; continue discussion regarding LTAC placement if failure to improve O2 requirements.  Code Status: DNI; Very much appreciate consult by pallitative care  Dr. Ladona Ridgel, who has been very helpful with ongoing goals of care for this patient.  [facility] PGY-3 Pager: [phone] 12/28/2011, 5:20 AM

## 2011-12-28 NOTE — Progress Notes (Addendum)
Patient ZO:XWRUEA B Sitton      DOB: 05-04-1990      VWU:[id number]   Palliative Medicine Team at [facility] Progress Note    Subjective: [name] had a quiet night per nursing.  This am had some respiratory distress but was able to cough up and suction out some secretions .  His recovery time from one of "his spells" was approximately 20- 30 minutes.  After repositioning on his left side which is his favored side he was able to settle down and decreased his heart and respiratory rate.  His mom and dad were able to discuss adjusting his code status.  What is important to them is his dignity and comfort and so they have elected to change for the time being his status to no chest compressions, no acls/code defibrillations.  If his quality of life were to change back to a stronger state, [name] states that she reserve the right to rescind this back to DNI only.  Please let it be clear that if [name] were develop and irregular non life threatening arrythmia they would expect to have a discussion about treatment options and would want medications that are standard of care.  [name] is going to speak with her husband about the use of pacemaker and defibrillation/cardioversion and get back with [place] on these issues.     Filed Vitals:   12/28/11 0800  BP: 109/79  Pulse: 118  Temp: 98.2 F (36.8 C)  Resp: 21   Physical exam: General : initially agitated and in distress , currently resting better , heart rate coming down.  Sats never dropped 94-97 % Chest: intermittent coarse rhonchi, but at rest clear, no wheezing CVS: tachycardia, S1, S2 no s3 Abd: soft, peg in place Ext: contracted,  Trace edema Neuro: baseline cognitive deficits  Lab Results  Component Value Date   WBC 10.3 12/28/2011   HGB 14.2 12/28/2011   HCT 42.1 12/28/2011   MCV 92.7 12/28/2011   PLT 407* 12/28/2011   Lab Results  Component Value Date   CREATININE <0.20* 12/28/2011   BUN 8 12/28/2011   NA 135 12/28/2011   K 4.8 12/28/2011   CL 98  12/28/2011   CO2 31 12/28/2011     Assessment and plan: 21 year old with CP and life limiting cognitive impairment.  Patient admitted with hypoxia and respiratory distress felt to be secondary to a viral URI.  Patient has made some minimal improvements and is now on a venti mask reliably maintaining his sats and developing tachycardia. Patient had a typical respiratory episode today that mom and I intervened upon, wish aggressive pulmonary toilet, and repositioning.  He was able to recover over thirty minute period of time.   1.  Partial Code:  Mom agrees to accept NO chest compressions, No Defibrillation with ACLS,  No medical CODE, but IS expecting intervention on blood pressure issues and irregular non life threatening arrythmias. She is going to speak with her spouse regarding the use of pacemakers or cardioversion.  2.  Respiratory distress secondary to secretions:  Continue aggressive pulmonary toilet,  Nebs, guaifenesin (tempered with robinul)  3.  Seizures : mom noticed several seizures yesterday. States that she give the neurontin and Depakote together which might make a difference. Will ask pharmacy to adjust the doing.  4.  Continue aggressive skin care to keep buttocks from breaking down  5 .  Diarrhea: no c diff.  Consider adding florastor .  Guaifenesin can also cause loose stools ,  might need to do trial and error test.  Will leave up to primary team.   6.  Disposition:  LTac being considered.  Will work with CM on Monday.  Mom is aware that patient may not meet the criteria.  Options for this family is home vs LTAC.  SNF not an option for this family.  MOST Form on chart.  Time In Time Out Total Time Spent with Patient Total Overall Time  1000 am 11 am 60 min 45 min   Quinten Allerton L. Ladona Ridgel, MD MBA The Palliative Medicine Team at [facility] Phone: [phone] Pager: [phone]

## 2011-12-28 NOTE — Progress Notes (Signed)
Ok for patient to be without IV access per MD and patient Mother.

## 2011-12-28 NOTE — Progress Notes (Signed)
FMTS Attending Daily Note: Todd Skelton MD [phone] pager office [phone] I  have seen and examined this patient, reviewed their chart. I have discussed this patient with the resident. I agree with the resident's findings, assessment and care plan. 

## 2011-12-29 DIAGNOSIS — J9620 Acute and chronic respiratory failure, unspecified whether with hypoxia or hypercapnia: Secondary | ICD-10-CM

## 2011-12-29 MED ORDER — FLORANEX PO PACK
2.0000 g | PACK | Freq: Three times a day (TID) | ORAL | Status: DC
  Administered 2011-12-29 – 2012-01-05 (×21): 2 g
  Filled 2011-12-29 (×28): qty 2

## 2011-12-29 NOTE — Progress Notes (Signed)
Name: [name] MRN: [id number] DOB: 1990/04/22    LOS: 12  Referring Provider:  Dr Sheffield Slider, FPTS Reason for Referral:  Respiratory distress, hypoxemia   PULMONARY / CRITICAL CARE MEDICINE  HPI:  21 y/o man with cerebral palsy, MR, seizures, known to PCCM -followed by Dr. Delton Coombes. He was admitted to [facility] on 10/23 with few days of increased secretions, resp difficulty requiring albuterol (which he often does not require) and low grade fevers. He has been treated for possible HCAP. Course c/b increase freq seizures, some agitation associated with resp distress and desaturations. No real improvement in oxygen needs or WOB despite supportive care and rx for suspected bacterial PNA   Subjective:  RN reports no change in O2, no significant change in patient status   Vital Signs: Temp:  [97.7 F (36.5 C)-98.2 F (36.8 C)] 98 F (36.7 C) (11/04 1300) Pulse Rate:  [101-130] 118  (11/04 1300) Resp:  [12-18] 12  (11/04 1300) BP: (102-114)/(63-82) 111/82 mmHg (11/04 1300) SpO2:  [98 %-100 %] 100 % (11/04 1300) FiO2 (%):  [40 %-50 %] 40 % (11/04 1300) Weight:  [96 lb 1.6 oz (43.591 kg)] 96 lb 1.6 oz (43.591 kg) (11/04 0500)  Intake/Output Summary (Last 24 hours) at 12/29/11 1509 Last data filed at 12/29/11 1440  Gross per 24 hour  Intake    855 ml  Output    150 ml  Net    705 ml    Physical Examination: General:  Ill appearing, non-communcative Neuro:  Quadriplegia, some eye movements HEENT:  Some UA obstruction from tongue, disconjugate gaze Neck:  No overt stridor Cardiovascular:  Tachy, regular Lungs: mild accessory muscle use, 40% VM, bronchial BS on L, insp crackles. Some referred UA noise on the R Abdomen: soft, round Musculoskeletal:  contracted Skin:  No wounds or rashes  Principal Problem:  *Community acquired pneumonia Active Problems:  MENTAL RETARDATION  SLEEP APNEA, OBSTRUCTIVE  CEREBRAL PALSY  SOB (shortness of breath)  Acute and chronic respiratory failure  Pain, generalized  Increased oropharyngeal secretions   Lab 12/28/11 0500 12/24/11 0500 12/23/11 0710  NA 135 -- 137  K 4.8 -- 3.9  CL 98 -- 97  CO2 31 -- 32  GLUCOSE 102* -- 106*  BUN 8 -- 8  CREATININE <0.20* <0.20* <0.20*  CALCIUM 10.0 -- 9.4  MG -- -- --  PHOS -- -- --    Lab 12/28/11 0500 12/24/11 0845  HGB 14.2 13.0  HCT 42.1 39.6  WBC 10.3 7.2  PLT 407* 147*   Ventilator Settings: Vent Mode:  [-]  FiO2 (%):  [40 %-50 %] 40 %  Intake/Output Summary (Last 24 hours) at 12/29/11 1509 Last data filed at 12/29/11 1440  Gross per 24 hour  Intake    855 ml  Output    150 ml  Net    705 ml   CXR:  10/26 >> Possible LLL infiltrate, R clear            10/29 >> worsening LLL infiltrate vs volume loss, evolving R perihilar/ RML infiltrate  Impressions/Recommendations:   Acute respiratory failure  OSA Likely due to infectious process (viral vs superimposed bacterial PNA) in a debilitated man with no pulmonary reserve. Has been afebrile without leukocytosis. If this is a viral process, time course will be 10-14 days.  Dr. Delton Coombes spoke with the patient mother, she is aware that the patient is in supportive care only but transition to comfort care is a possibility if patient  continues to fail to progress.  I do not foresee him going home on such high flow of O2.  Per Dr. Delton Coombes discussion with the family mother is not quite ready for transition to comfort care.  Not on CPAP at home.   Plan: - Finish empiric course abx - Pulmonary toilet and vibravest, mobilize as able - No indication for steroids here, no wheezing on exam - Would reconsider BiPAP if he can tolerate >> there is high risk of aspiration, but he has significant LLL and R basilar atelectasis that are likely barriers to any improvement here.  - Code status noted, no intubation, BiPAP is not refused by mother.  Palliative care following.   Seizure disorder Cerebral Palsy Plan: -per primary SVC  [name],  NP-C [facility] Pgr: [phone] or [phone]  Evidently mother has some questions but was not there when I was saw patient earlier today.  Apt between 11 and 12 tomorrow to answer questions and concerns.  LTAC would be an acceptable option here.  It is more important to understand however that the patient's chances of any meaningful recovery here.  This will be relayed to the patient's mother tomorrow.  Patient seen and examined, agree with above note.  I dictated the care and orders written for this patient under my direction.  [name], M.D. [phone]

## 2011-12-29 NOTE — Progress Notes (Signed)
Nutrition Follow-up  Intervention:   1.  Enteral nutrition; continue current management.  Continue MVI  Assessment:   Pt admitted with respiratory difficulty. Pt resting comfortably at time of visit. Mom at bedside states that tube with some clogging issues which are normal.  Pt was resumed home regimen on admission with addition of MVI which pt is tolerating. Residuals:  0 mL at last documented check. Last BM (11/4)  Pt remains in step down due to respiratory distress. Still requiring venturi mask.  Diet Order:  Osmolite 1.2 @ 37 mL/hr continuous per home regimen.  Meds: Scheduled Meds:    . acetaminophen (TYLENOL) oral liquid 160 mg/5 mL  650 mg Oral Q8H  . antiseptic oral rinse  15 mL Mouth Rinse BID  . baclofen  20 mg Per Tube TID WC  . divalproex  250 mg Oral Daily  . divalproex  375 mg Oral BID  . enoxaparin (LOVENOX) injection  30 mg Subcutaneous Q24H  . free water  30 mL Per Tube Q2H  . gabapentin  300 mg Per Tube Custom  . Gerhardt's butt cream  1 application Topical BID  . glycopyrrolate  1 mg Per Tube BID  . guaiFENesin  200 mg Per Tube Q6H  . lactobacillus  2 g Per Tube TID  . levalbuterol  0.63 mg Nebulization Q4H  . multivitamin  5 mL Per Tube Daily  . pantoprazole sodium  40 mg Per Tube Daily  . [DISCONTINUED] lactobacillus acidophilus  2 tablet Per Tube TID   Continuous Infusions:    . feeding supplement (OSMOLITE 1.2 CAL) 1,000 mL (12/28/11 1654)   PRN Meds:.acetaminophen (TYLENOL) oral liquid 160 mg/5 mL, levalbuterol, naphazoline, nystatin cream   CMP     Component Value Date/Time   NA 135 12/28/2011 0500   K 4.8 12/28/2011 0500   CL 98 12/28/2011 0500   CO2 31 12/28/2011 0500   GLUCOSE 102* 12/28/2011 0500   BUN 8 12/28/2011 0500   CREATININE <0.20* 12/28/2011 0500   CALCIUM 10.0 12/28/2011 0500   GFRNONAA NOT CALCULATED 12/28/2011 0500   GFRAA NOT CALCULATED 12/28/2011 0500    CBG (last 3)  No results found for this basename: GLUCAP:3 in the last  72 hours   Intake/Output Summary (Last 24 hours) at 12/29/11 1555 Last data filed at 12/29/11 1440  Gross per 24 hour  Intake    855 ml  Output    150 ml  Net    705 ml    Weight Status:  96 lbs, stable  Re-estimated needs:  1000-1200 kcal, 36-45g protein  Nutrition Dx:  Inadequate oral intake r/t inability to eat, ongoing.  Not likely to resolve given medical condition and cognition/developemental delay  Monitor:   Goal: EN to meet >90% of estimated needs.  Monitor: TF tolerance, weights, labs   Loyce Dys, MS RD LDN Clinical Inpatient Dietitian Pager: [phone] Weekend/After hours pager: [phone]

## 2011-12-29 NOTE — Progress Notes (Signed)
FMTS Daily Intern Progress Note  Subjective: Stable overnight and per mom, was more alert and laughing with her last night.  Today, some increase in congestion and mom feels it is time for another respiratory therapy treatment.  Mom appreciates Dr. Ladona Ridgel and Dr. Kavin Leech excellent care.  Would like to see Dr. Delton Coombes today to discuss [name]'s pulmonary status.   I have reviewed the patient's medications.  Objective Temp:  [97.7 F (36.5 C)-98.6 F (37 C)] 97.7 F (36.5 C) (11/04 0730) Pulse Rate:  [101-143] 106  (11/04 0730) Resp:  [13-29] 15  (11/04 0730) BP: (102-114)/(63-82) 106/70 mmHg (11/04 0730) SpO2:  [96 %-100 %] 99 % (11/04 0853) FiO2 (%):  [50 %] 50 % (11/04 0853) Weight:  [96 lb 1.6 oz (43.591 kg)] 96 lb 1.6 oz (43.591 kg) (11/04 0500)   Intake/Output Summary (Last 24 hours) at 12/29/11 0942  Gross per 24 hour  Intake   1197 ml  Output    150 ml  Net   1047 ml    General: Lying in bed, NAD HEENT: Normocephalic, sclera clear, MMM, does not track for me today CV: Difficult to hear over upper airway sounds; RRR Pulm: Poor effort, shallow breaths, breathing mask on, loud upper airway breath sounds transmitted into lungs; upper airways congested-sounding  Ext: Contractured arms and legs, no edema or cyanosis in arms noted Neuro: Awake, eyes open, occasional jerks and vocal grunts which appears to be near patient's baseline   Labs and Imaging WBC 10.3 Hgb 14.2 HCT 42.1 PLT 407<--147  Na 135 K 4.8 Cl 98 CO2 31 BUN 8 Cr <0.2  Assessment and Plan BRUIN BOLGER is a 21 y.o. year old male with cerebral palsy, mental retardation, and seizure disorder with persistent hypoxemia/acute respiratory failure secondary to viral vs. bacterial pneumonia, refractory to antibiotics and supportive care.   Plan:  1. Pneumonia (Aspiration vs CAP vs viral)/acute respiratory failure - stable and 2 days ago transitioned down to 50% FiO2.   - Completed 8 days antibiotics with levaquin  750 mg 12/26/11.  - Guaifenesin to decrease thickness of secretions.  - Xopenex 2 hrs PRN for SOB as well as scheduled q 4 hrs.  - Continue Chest PT Q4 hours, RT recommendations in regards to his respiration status.  - Greatly appreciated Dr Delton Coombes and pulmonology team's input regarding patient's poor reserve capacity. Will continue supportive care and consider options including LTAC. [facility].  - Greatly appreciate Dr. Ladona Ridgel with Palliative Care meeting with pt and mother to discuss long-term goals of care. Noted the detailed planning and new orders initiated with scheduled tylenol, RT, and Kinair bed.  F/u regarding placement today  2. Seizure Disorder - stable  - Continue depakote home dosing. Valproic Acid level 70 on admission.  - If another seizure, will speak to Dr. 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 OSA - Continue nonrebreather mask overnight due to desaturations. Consider BiPAP   5. Loose stools -  negative c difficile PCR  FEN/GI: Tube feeds per nutrition. Home Prilosec.  Prophylaxis: Lovenox 40 mg SQ  Disposition: SDU given nonrebreather requirement; continue discussion regarding LTAC placement if failure to improve O2 requirements.  Code Status: DNI, no chest compressions, no defibrillation with ACLS, IS expecting intervention on BP and irregular non-life threatening arrhythmias with medication; speaking with spouse re: pacemakers or cardioversion - f/u on this and on documentation in EPIC/pt's chart; Very  much appreciate consult by pallitative care Dr. Truman Hayward Pager: [phone] 12/29/2011, 9:42 AM

## 2011-12-29 NOTE — Progress Notes (Signed)
Family Medicine Teaching Service Attending Note  I interviewed and examined patient [name] and reviewed their tests and x-rays.  I discussed with Dr. Benjamin Stain and reviewed their note for today.  I agree with their assessment and plan.     Additionally  Resting comfortably 100% on current venti setting Proceed as per pulmonary

## 2011-12-30 DIAGNOSIS — R1310 Dysphagia, unspecified: Secondary | ICD-10-CM

## 2011-12-30 NOTE — Progress Notes (Signed)
MD wrote order to decrease patient to 4L [place] but Mother refuses due to conversation with pulmonologist. Mom states "maybe try tomorrow". RT will continue to monitor.

## 2011-12-30 NOTE — Progress Notes (Signed)
Patient [name]      DOB: [birth year]      VWU:[id number]   Palliative Medicine Team at [facility] Progress Note    Subjective:  [name] is more awake this afternoon. He is cooing differently than in past days and seems more comfortable.  His mom is not at bedside, but I will call her.  Discussed plan for LTAC still in the process of being worked on by [name].  98 degrees,  111/82,  118  R:12 Sat 97 % on 35% venti Physical exam:  Eyes open.  Patient cooing not moaning Chest : decreased,  No audible rhochi at present CVS: tachy Extremities contracted at baseline Neuro: significant cognitive deficits with cerebral palsy   Assessment and plan: [name] is slowly improving.  I will talk with his mom and get a sense for what she is seenig.  No new recommendations. Continue aggressive pulmonary toilet.  Todd Andrus L. Ladona Ridgel, MD MBA The Palliative Medicine Team at [facility] Phone: [phone] Pager: [phone]

## 2011-12-30 NOTE — Progress Notes (Signed)
Name: [name] MRN: [id number] DOB: [birth year]    LOS: 13  Referring Provider:  Dr Sheffield Slider, FPTS Reason for Referral:  Respiratory distress, hypoxemia   PULMONARY / CRITICAL CARE MEDICINE  HPI:  21 y/o man with cerebral palsy, MR, seizures, known to PCCM -followed by Dr. Delton Coombes. He was admitted to [facility] on 10/23 with few days of increased secretions, resp difficulty requiring albuterol (which he often does not require) and low grade fevers. He has been treated for possible HCAP. Course c/b increase freq seizures, some agitation associated with resp distress and desaturations. No real improvement in oxygen needs or WOB despite supportive care and rx for suspected bacterial PNA   Subjective:  RN reports no change in O2, no significant change in patient status   Vital Signs: Temp:  [97.4 F (36.3 C)-98.2 F (36.8 C)] 97.5 F (36.4 C) (11/05 1104) Pulse Rate:  [98-120] 116  (11/05 1104) Resp:  [12-22] 15  (11/05 1104) BP: (90-124)/(52-86) 124/84 mmHg (11/05 1104) SpO2:  [92 %-100 %] 92 % (11/05 1104) FiO2 (%):  [35 %-40 %] 35 % (11/05 0817)  Intake/Output Summary (Last 24 hours) at 12/30/11 1129 Last data filed at 12/30/11 1000  Gross per 24 hour  Intake    994 ml  Output      0 ml  Net    994 ml    Physical Examination: General:  Ill appearing, non-communcative Neuro:  Quadriplegia, some eye movements HEENT:  Some UA obstruction from tongue, disconjugate gaze Neck:  No overt stridor Cardiovascular:  Tachy, regular Lungs: mild accessory muscle use, 40% VM, bronchial BS on L, insp crackles. Some referred UA noise on the R Abdomen: soft, round Musculoskeletal:  contracted Skin:  No wounds or rashes  Principal Problem:  *Community acquired pneumonia Active Problems:  MENTAL RETARDATION  SLEEP APNEA, OBSTRUCTIVE  CEREBRAL PALSY  SOB (shortness of breath)  Acute and chronic respiratory failure  Pain, generalized  Increased oropharyngeal secretions   Lab 12/28/11 0500  12/24/11 0500  NA 135 --  K 4.8 --  CL 98 --  CO2 31 --  GLUCOSE 102* --  BUN 8 --  CREATININE <0.20* <0.20*  CALCIUM 10.0 --  MG -- --  PHOS -- --    Lab 12/28/11 0500 12/24/11 0845  HGB 14.2 13.0  HCT 42.1 39.6  WBC 10.3 7.2  PLT 407* 147*   Ventilator Settings: Vent Mode:  [-]  FiO2 (%):  [35 %-40 %] 35 %  Intake/Output Summary (Last 24 hours) at 12/30/11 1129 Last data filed at 12/30/11 1000  Gross per 24 hour  Intake    994 ml  Output      0 ml  Net    994 ml   CXR:  10/26 >> Possible LLL infiltrate, R clear            10/29 >> worsening LLL infiltrate vs volume loss, evolving R perihilar/ RML infiltrate  Impressions/Recommendations:   Acute respiratory failure  OSA Likely due to infectious process (viral vs superimposed bacterial PNA) in a debilitated man with no pulmonary reserve. Has been afebrile without leukocytosis. If this is a viral process, time course will be 10-14 days.  Dr. Delton Coombes spoke with the patient mother, she is aware that the patient is in supportive care only but transition to comfort care is a possibility if patient continues to fail to progress.  I do not foresee him going home on such high flow of O2.  Per Dr. Delton Coombes  discussion with the family mother is not quite ready for transition to comfort care.  Not on CPAP at home.   Plan: - Finish empiric course abx - Pulmonary toilet and vibravest, mobilize as able - No indication for steroids here, no wheezing on exam - Would reconsider BiPAP if he can tolerate >> there is high risk of aspiration, but he has significant LLL and R basilar atelectasis that are likely barriers to any improvement here.  - Code status noted, no intubation, BiPAP is not refused by mother.  Palliative care following.   Seizure disorder Cerebral Palsy Plan: -per primary SVC  I spent quite a bit of time speaking with the patient's mother today.  I explained the entire case to her and what to expect given the circumstances.   I advised her to speak with the case manager regarding what advanced home care can provide for her son at home as she is very much wanting to take him home.  I have great sympathy for this case, please do not hesitate to call on me to speak with the family or facilitate any further discharge planning.  Alyson Reedy, M.D. [facility] Pulmonary/Critical Care Medicine. Pager: [phone]. After hours pager: [phone].

## 2011-12-30 NOTE — Progress Notes (Signed)
Patient [name]      DOB: [birth year]      VWU:[id number]  Spoke with patient's mom.  She is concerned with the continued excoriation of his buttocks which has been treated with topical balms,  But has had multiple stools which are started to become more formed.  I communicated with the Wound care nurses who will see him asap in the am. She is also concerned about weaning him further as she took away from Dr. Percival Spanish conversation that this should not be done quickly.   I spoke with CM and the LTAC responses are still pending.  Jt was sleeping very comfortably when I arrived and I did not disturb him.   We will continue to follow with.  Cyana Shook L. Ladona Ridgel, MD MBA The Palliative Medicine Team at [facility] Phone: [phone] Pager: [phone]

## 2011-12-30 NOTE — Progress Notes (Signed)
FMTS Daily Intern Progress Note  Subjective: No acute events overnight, asleep this morning and mom not present.  I have reviewed the patient's medications.  Objective Temp:  [97.4 F (36.3 C)-98.2 F (36.8 C)] 97.5 F (36.4 C) (11/05 1104) Pulse Rate:  [98-120] 116  (11/05 1104) Resp:  [12-22] 15  (11/05 1104) BP: (90-124)/(52-86) 124/84 mmHg (11/05 1104) SpO2:  [92 %-100 %] 97 % (11/05 1211) FiO2 (%):  [35 %-40 %] 35 % (11/05 1211)   Intake/Output Summary (Last 24 hours) at 12/30/11 1231 Last data filed at 12/30/11 1000  Gross per 24 hour  Intake    957 ml  Output      0 ml  Net    957 ml   General: Lying in bed, asleep, NAD, wakes during exam  HEENT: Normocephalic, sclera clear, MMM, does not track for me CV: RRR  Pulm: Poor effort, shallow breaths, breathing mask on, snoring and loud upper airway breath sounds transmitted into lungs  Ext: Contractured arms and legs, no edema or cyanosis in arms noted  Neuro: Once awake, eyes open, occasional jerks and vocal grunts which appears to be near patient's baseline   No new labs/imaging  Assessment and Plan [name] is a 21 y.o. year old male with cerebral palsy, mental retardation, and seizure disorder with persistent hypoxemia/acute respiratory failure secondary to viral vs. bacterial pneumonia, refractory to antibiotics and supportive care.   Plan:   1. Pneumonia (Aspiration vs CAP vs viral)/acute respiratory failure - stable and yesterday transitioned down to 35% FiO2.  - Completed 8 days antibiotics with levaquin 750 mg 12/26/11.  - Guaifenesin to decrease thickness of secretions.  - Xopenex 2 hrs PRN for SOB as well as scheduled q 4 hrs.  - Continue Chest PT Q4 hours, RT recommendations in regards to his respiration status.  - Greatly appreciated Dr Delton Coombes and pulmonology team's input regarding patient's poor reserve capacity. Will continue supportive care and consider options including LTAC at [facility] vs  home per mom's request - consult RT and CM to evaluate if possible to go home on O2 by [place] or mask at 35% FiO2 - Greatly appreciate Dr. Ladona Ridgel with Palliative Care meeting with pt and mother to discuss long-term goals of care. Noted the detailed planning and new orders initiated with scheduled tylenol, RT, and Kinair bed. F/u regarding placement v home  2. Seizure Disorder - stable  - Continue depakote home dosing. Valproic Acid level 70 on admission.  - If another seizure, will speak to Dr. 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 OSA - Continue nonrebreather mask overnight due to desaturations. Consider BiPAP   5. Loose stools - negative c difficile PCR and no further reports of loose stool  FEN/GI: Tube feeds per nutrition. Home Prilosec.   Prophylaxis: Lovenox 40 mg SQ   Disposition: SDU given nonrebreather requirement; continue discussion regarding LTAC placement if failure to improve O2 requirements v home with mild improvement over past 2 days   Code Status: DNI, no chest compressions, no defibrillation with ACLS, IS expecting intervention on BP and irregular non-life threatening arrhythmias with medication; speaking with spouse re: pacemakers or cardioversion - f/u on this and on documentation in EPIC/pt's chart; Very much appreciate consult by pallitative care Dr. Truman Hayward Pager: [phone] 12/30/2011, 12:31 PM

## 2011-12-30 NOTE — Progress Notes (Signed)
Clinical Social Worker met with pt's mother.  CSW provided opportunity for mother to process feelings.  Mother expressed concerns related to pt's CAPS worker and pt's eligibility after a long hospital stay.  CSW agreed to phone CAPS-message left with Kathreen Devoid, CAPS supervisor.  Mother inquired about LTAC.  CSW reviewed again that pt's insurance is in network with [facility] and there may be an out of pocket expense with [facility].  Both [facility] and [facility] currently reviewing pt.  Mother stated understanding and "If there is an out of pocket expense, we can't do that". CSW to continue to follow and assist as needed.   Angelia Mould, MSW, [place] [phone]

## 2011-12-30 NOTE — Progress Notes (Signed)
Family Medicine Teaching Service Attending Note  I interviewed and examined patient [name] and reviewed their tests and x-rays.  I discussed with Dr. Benjamin Stain and reviewed their note for today.  I agree with their assessment and plan.     Additionally  His status is essentially the same.  Awake does not seem to respond to verbal or tactile stimuli Lungs - no wheeze Saturations are 99-100 and lowest has been 92 in last 24 hours Mom is refusing to lower his oxygen.  She relates that Dr Molli Knock recommended no change is his oxygen setup despite his high saturations.  ( I do not see any recommendations in his note.)  I sympathize with Mom and understand her concerns however her approach is going to make caring for [name] by his primary team difficult. We will defer any adjustments Pulmonary and will contact them tomorrow.

## 2011-12-31 ENCOUNTER — Inpatient Hospital Stay (HOSPITAL_COMMUNITY): Payer: BC Managed Care – PPO

## 2011-12-31 LAB — CREATININE, SERUM: Creatinine, Ser: 0.2 mg/dL — ABNORMAL LOW (ref 0.50–1.35)

## 2011-12-31 MED ORDER — DIAZEPAM 2.5 MG RE GEL: 20.0000 mg | RECTAL | Status: DC | PRN

## 2011-12-31 MED ORDER — IOHEXOL 300 MG/ML  SOLN
50.0000 mL | Freq: Once | INTRAMUSCULAR | Status: AC | PRN
  Administered 2011-12-31: 10 mL via INTRAVENOUS

## 2011-12-31 MED ORDER — DIAZEPAM 2.5 MG RE GEL: 10.0000 mg | RECTAL | Status: DC | PRN

## 2011-12-31 NOTE — Progress Notes (Signed)
IV team RN was unsuccessful at PIV insertion. Pt mother refusing IV placement at this time. Will cont to monitor pt. Transport here for IR for revision of PEG. [name] L

## 2011-12-31 NOTE — Progress Notes (Signed)
Pt experienced full body tremors lasting approximately 1 minute. HR increased to 160s. MD notified. Will cont to monitor pt. Lashina Milles L

## 2011-12-31 NOTE — Progress Notes (Signed)
PCP note I truly appreciate the excellent care being provided by the FMTS, pulmonary team, and palliative care team regarding this complex patient. I understand that the family is considering LTAC placement at this time.  The mother expressed two concerns to me today: 1. She would like to be present when IR replaces his feeding tube which has been getting clogged if possible.  2. She is wondering about the appropriate time to wean him off of his oxygen. I communicated these concerns to the FMTS.

## 2011-12-31 NOTE — Progress Notes (Signed)
PGY-1 Interim Progress Note Family Medicine Teaching Service  Patient with tachycardia since 1 pm for about 1.5 hours now.  O2 saturation also dropped to mid 70s. Was getting respiratory therapy at the time and saturation improved when mom suctioned sputum from oropharynx and O2 increased to 40% FiO2.  Mom states tachycardia like this occurs frequently for patient, especially earlier in hospitalization.  This is likely secondary to patient discomfort with respiratory therapy and congestion.  Went to see patient, and BP stable at 100s/70s, afebrile.  Got stat EKG that showed sinus tachycardia with no other abnormalities.  Spoke with Dr Molli Knock with pulmonology/CCM who recommended continuing to monitor for signs of sepsis if pt continues having tachycardia (hypotension, hypo/hyperthermia, clinical decline).    Regarding discharge planning, Dr. Molli Knock recommends LTAC so that patient can receive O2 to maintain O2 saturation.  Best method is likely by mask as patient is a mouth breather.  Can decrease FiO2 or oxygen flow titrating to O2 saturation.  Discharge pending LTAC placement.  [name] 12/31/2011 2:33 PM

## 2011-12-31 NOTE — Progress Notes (Signed)
FMTS Daily Intern Progress Note  Subjective: Mom met yesterday with Dr. Molli Knock who answered some of her reasonable questions.  PEG tube problematic today, possibly clogged per RN.    I have reviewed the patient's medications.  Objective Temp:  [96.8 F (36 C)-98.3 F (36.8 C)] 96.8 F (36 C) (11/06 0814) Pulse Rate:  [88-124] 124  (11/06 0814) Resp:  [15-21] 19  (11/06 0814) BP: (97-124)/(55-84) 122/84 mmHg (11/06 0814) SpO2:  [92 %-100 %] 99 % (11/06 0814) FiO2 (%):  [35 %] 35 % (11/06 0755) Weight:  [96 lb 5.5 oz (43.7 kg)] 96 lb 5.5 oz (43.7 kg) (11/06 0427)   Intake/Output Summary (Last 24 hours) at 12/31/11 0842 Last data filed at 12/31/11 0600  Gross per 24 hour  Intake    698 ml  Output      0 ml  Net    698 ml    General: Lying in bed, asleep, NAD  HEENT: Normocephalic.  Lots of upper airway/nasal sounds during sleep that seem related to congestion CV: RRR, no murmurs, rubs, gallops heard  Pulm: Poor effort, shallow breaths, breathing mask on, snoring and loud upper airway breath sounds transmitted into lungs  Ext: Contractured arms and legs, no edema or cyanosis in arms noted  Neuro: Asleep and did not attempt to wake patient  Labs and Imaging: Cr <0.2   Assessment and Plan - [name] is a 21 y.o. year old male with cerebral palsy, mental retardation, and seizure disorder with persistent hypoxemia/acute respiratory failure secondary to viral vs. bacterial pneumonia, s/p antibiotics and receiving supportive care, with lengthy duration of symptoms.  1. Pneumonia (Aspiration vs CAP vs viral)/acute respiratory failure - stable and continuing on 35% FiO2.  - Completed 8 days antibiotics with levaquin 750 mg 12/26/11.  - Guaifenesin for secretions  - Xopenex q 2 hrs PRN SOB and scheduled q 4 hrs.  - Continue Chest PT Q4 hours, RT recommendations in regards to his respiration status.  - Greatly appreciate pulmonology team's input regarding patient's poor reserve  capacity. Will continue supportive care and f/u options of LTAC at [facility] vs home per mom's request - consult RT, pulm recs, and CM to evaluate if possible to go home on O2 by [place] or mask at 35% FiO2  - Greatly appreciate Dr. Ladona Ridgel with Palliative Care meeting with pt and mother to discuss long-term goals of care. Noted the detailed planning and new orders initiated with scheduled tylenol, RT, and Kinair bed. F/u regarding placement v home   2. Seizure Disorder - stable  - Continue depakote home dosing. Valproic Acid level 70 on admission.  - If another seizure, will speak to Dr. 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 OSA - Continue nonrebreather mask overnight due to desaturations. Consider BiPAP   5. Loose stools - negative c difficile PCR and no further reports of loose stool; buttocks red and irritated -moisture damage v candidiasis  - Has barrier cream and kinair bed; Consider adding antifungal cream   FEN/GI: Tube feeds per nutrition. Home Prilosec.  - IR consulted to revise PEG tube which appears clogged today  Prophylaxis: Lovenox 40 mg SQ   Disposition: SDU given nonrebreather requirement; continue discussion regarding LTAC placement if failure to improve O2 requirements v home with mild improvement over past 2 days   Code Status: DNI, no chest compressions, no defibrillation with ACLS, IS expecting  intervention on BP and irregular non-life threatening arrhythmias with medication; speaking with spouse re: pacemakers or cardioversion - f/u on this and on documentation in EPIC/pt's chart; Very much appreciate consult by pallitative care Dr. Truman Hayward Pager: [phone] 12/31/2011, 8:42 AM

## 2011-12-31 NOTE — Progress Notes (Signed)
@   0734 unable to flush patient's peg tube. Patient's abdomen soft, positive bowel sounds, and appears to be in no pain. Tube feeding placed on hold. Patient's mother notified and Dr. Ermalinda Memos notified.

## 2011-12-31 NOTE — Progress Notes (Signed)
PGY-1 interim progress note Family Medicine Teaching Service   Received call from RN that patient had had 1 minute of generalized tonic-clonic seizure that resolved spontaneously.  Mom states pt has PRN diazepam PR at home.  Called Dr. Sharene Skeans (neurology) who knows patient, and he recommends that seizure threshold lower with illness and to add PRN diazepam PR if patient has generalized seizure activity that last >2 minutes.  This does not need to be given if seizure stops on its own in <2 minutes.  [name] 12/31/2011 5:50 PM

## 2011-12-31 NOTE — Progress Notes (Signed)
Palliative Medicine Team SW Psychosocial visit with pt's mother. Introduced myself and role for emotional support. Mtr expressed her difficulty with not being "in control" over pt's environment and the frustration with awaiting care for pt on the schedule of the medical team. Affirmed mtr's care and advocacy for pt and the difficulty that must be providing care outside their familial environment. Left to give pt privacy before personal care. Mtr thankful for visit, has PMT contact info as needs arise.   Kennieth Francois, [place] Pager [phone]

## 2011-12-31 NOTE — Progress Notes (Signed)
Name: [name] MRN: [id number] DOB: 1990/08/19    LOS: 14  Referring Provider:  Dr Sheffield Slider, FPTS Reason for Referral:  Respiratory distress, hypoxemia   PULMONARY / CRITICAL CARE MEDICINE  HPI:  21 y/o man with cerebral palsy, MR, seizures, known to PCCM -followed by Dr. Delton Coombes. He was admitted to [facility] on 10/23 with few days of increased secretions, resp difficulty requiring albuterol (which he often does not require) and low grade fevers. He has been treated for possible HCAP. Course c/b increase freq seizures, some agitation associated with resp distress and desaturations. No real improvement in oxygen needs or WOB despite supportive care and rx for suspected bacterial PNA   Subjective:   Now tol 35%VM.  To IR today for PEG revisement   Vital Signs: Temp:  [96.8 F (36 C)-98.3 F (36.8 C)] 96.8 F (36 C) (11/06 0814) Pulse Rate:  [88-124] 124  (11/06 0814) Resp:  [15-21] 19  (11/06 0814) BP: (97-122)/(55-84) 122/84 mmHg (11/06 0814) SpO2:  [95 %-100 %] 99 % (11/06 0814) FiO2 (%):  [35 %] 35 % (11/06 0814) Weight:  [96 lb 5.5 oz (43.7 kg)] 96 lb 5.5 oz (43.7 kg) (11/06 0427)  Intake/Output Summary (Last 24 hours) at 12/31/11 1115 Last data filed at 12/31/11 0600  Gross per 24 hour  Intake    624 ml  Output      0 ml  Net    624 ml    Physical Examination: General:  Ill appearing, non-communcative Neuro:  Quadriplegia, some eye movements HEENT:  Some UA obstruction from tongue, disconjugate gaze Neck:  No overt stridor Cardiovascular:  Tachy, regular Lungs: mild accessory muscle use, 35% VM, bronchial BS on L, insp crackles.  Abdomen: soft, round Musculoskeletal:  contracted Skin:  No wounds or rashes  Principal Problem:  *Community acquired pneumonia Active Problems:  MENTAL RETARDATION  SLEEP APNEA, OBSTRUCTIVE  CEREBRAL PALSY  SOB (shortness of breath)  Acute and chronic respiratory failure  Pain, generalized  Increased oropharyngeal secretions   Lab  12/31/11 0555 12/28/11 0500  NA -- 135  K -- 4.8  CL -- 98  CO2 -- 31  GLUCOSE -- 102*  BUN -- 8  CREATININE 0.20* <0.20*  CALCIUM -- 10.0  MG -- --  PHOS -- --    Lab 12/28/11 0500  HGB 14.2  HCT 42.1  WBC 10.3  PLT 407*   Ventilator Settings: Vent Mode:  [-]  FiO2 (%):  [35 %] 35 %  Intake/Output Summary (Last 24 hours) at 12/31/11 1115 Last data filed at 12/31/11 0600  Gross per 24 hour  Intake    624 ml  Output      0 ml  Net    624 ml   CXR:  No new CXR   Impressions/Recommendations:   Acute hypoxic respiratory failure  OSA Likely due to infectious process (viral vs superimposed bacterial PNA) in a debilitated man with no pulmonary reserve. Has been afebrile without leukocytosis. If this is a viral process, time course will be 10-14 days.  Cont to be significantly hypoxic but does seem to be tolerating some wean of FiO2.  Now on 35% VM.  Dr. Delton Coombes spoke with the patient mother, she is aware that the patient is in supportive care only but transition to comfort care is a possibility if patient continues to fail to progress.    Plan: - Has completed empiric course abx - Pulmonary toilet and vibravest, mobilize as able - No indication for  steroids here, no wheezing on exam - Would reconsider BiPAP if he can tolerate >> there is high risk of aspiration, but he has significant LLL and R basilar atelectasis that are likely barriers to any improvement here.  - Code status noted, no intubation, BiPAP is not refused by mother.  Palliative care following.   Seizure disorder Cerebral Palsy Plan: -per primary SVC  Dispo -- per primary.  Options are LTAC v home.  His mother very much wants to take him home which may be more feasible as he is requiring less O2.   Cont efforts to wean O2 as tol.  Palliative care following.  Seems as though mom is reasonable but not quite ready to transition to comfort care.    [name], NP 12/31/2011  11:16 AM Pager: [phone]  [phone] or [phone]  *Care during the described time interval was provided by me and/or other providers on the critical care team. I have reviewed this patient's available data, including medical history, events of note, physical examination and test results as part of my evaluation.  I spent some time with mother today, introduced select case Production designer, theatre/television/film.  Patient's O2 is down to 35% however he is a mouth breather and [place] will not be effective for him.  Will need LTAC for some time until able to either decrease FiO2 or use  to go home.  Patient seen and examined, agree with above note.  I dictated the care and orders written for this patient under my direction.  [name], M.D. [phone]

## 2011-12-31 NOTE — Consult Note (Addendum)
Wound care consult:  Consult requested for buttocks.  Pt's mother is at bedside and has cared for his skin his entire life.  At one point he had a pressure ulcer which was followed by outpatient wound care center and area healed.  Pt has recently been having frequent incontinent diarrhea and buttocks have become red, macerated, and appearance is consistent with moisture acquired skin damage and red macular-papular rash consistent with candidiasis.  Pt on Kinair bed for pressure reduction.  Using barrier cream to protect skin and repel moisture.   Pt could benefit from antifungal meds to promote healing.  Continue present plan of care.  Discussed skin protection with mother at bedside who is well-informed.  Will not plan to follow further unless re-consulted.  Mardee Postin, RN, MSN, [employer]  [phone]

## 2011-12-31 NOTE — Progress Notes (Signed)
Family Medicine Teaching Service Attending Note  I interviewed and examined patient [name] and reviewed their tests and x-rays.  I discussed with Dr. Karie Schwalbe and reviewed their note for today.  I agree with their assessment and plan.     Additionally  Continue current treatment Ask Pulmonary for opinion on O2 weaning Consider rechecking Cdiff if diarrhea continues

## 2012-01-01 MED ORDER — KETOCONAZOLE 200 MG PO TABS
100.0000 mg | ORAL_TABLET | Freq: Once | ORAL | Status: AC
  Administered 2012-01-01: 100 mg via ORAL
  Filled 2012-01-01: qty 1

## 2012-01-01 MED ORDER — DIAZEPAM 2.5 MG RE GEL: 10.0000 mg | RECTAL | Status: DC | PRN

## 2012-01-01 NOTE — Progress Notes (Signed)
Name: [name] MRN: [id number] DOB: 03-02-1990    LOS: 15  Referring Provider:  Dr Sheffield Slider, FPTS Reason for Referral:  Respiratory distress, hypoxemia   PULMONARY / CRITICAL CARE MEDICINE  HPI:  21 y/o man with cerebral palsy, MR, seizures, known to PCCM -followed by Dr. Delton Coombes. He was admitted to [facility] on 10/23 with few days of increased secretions, resp difficulty requiring albuterol (which he often does not require) and low grade fevers. He has been treated for possible HCAP. Course c/b increase freq seizures, some agitation associated with resp distress and desaturations. No real improvement in oxygen needs or WOB despite supportive care and rx for suspected bacterial PNA   Subjective:   Now tol 40%VM.     Vital Signs: Temp:  [97.3 F (36.3 C)-98.9 F (37.2 C)] 98 F (36.7 C) (11/07 1119) Pulse Rate:  [86-151] 122  (11/07 1119) Resp:  [13-26] 17  (11/07 1119) BP: (96-134)/(64-87) 114/73 mmHg (11/07 1119) SpO2:  [81 %-100 %] 100 % (11/07 1225) FiO2 (%):  [40 %-50 %] 40 % (11/07 1225) Weight:  [43.5 kg (95 lb 14.4 oz)] 43.5 kg (95 lb 14.4 oz) (11/07 0341)  Intake/Output Summary (Last 24 hours) at 01/01/12 1307 Last data filed at 01/01/12 1100  Gross per 24 hour  Intake   1033 ml  Output      0 ml  Net   1033 ml    Physical Examination: General:  Ill appearing, non-communcative Neuro:  Quadriplegia, some eye movements HEENT:  Some UA obstruction from tongue, disconjugate gaze Neck:  No overt stridor Cardiovascular:  Tachy, regular Lungs: mild accessory muscle use, 35% VM, bronchial BS on L, insp crackles. desat x 3 onn 40% Abdomen: soft, round Musculoskeletal:  contracted Skin:  No wounds or rashes  Principal Problem:  *Community acquired pneumonia Active Problems:  MENTAL RETARDATION  SLEEP APNEA, OBSTRUCTIVE  CEREBRAL PALSY  SOB (shortness of breath)  Acute and chronic respiratory failure  Pain, generalized  Increased oropharyngeal secretions   Lab  12/31/11 0555 12/28/11 0500  NA -- 135  K -- 4.8  CL -- 98  CO2 -- 31  GLUCOSE -- 102*  BUN -- 8  CREATININE 0.20* <0.20*  CALCIUM -- 10.0  MG -- --  PHOS -- --    Lab 12/28/11 0500  HGB 14.2  HCT 42.1  WBC 10.3  PLT 407*   Ventilator Settings: Vent Mode:  [-]  FiO2 (%):  [40 %-50 %] 40 %  Intake/Output Summary (Last 24 hours) at 01/01/12 1307 Last data filed at 01/01/12 1100  Gross per 24 hour  Intake   1033 ml  Output      0 ml  Net   1033 ml   CXR:  No new CXR   Impressions/Recommendations:   Acute hypoxic respiratory failure  OSA Likely due to infectious process (viral vs superimposed bacterial PNA) in a debilitated man with no pulmonary reserve. Has been afebrile without leukocytosis. If this is a viral process, time course will be 10-14 days.  Cont to be significantly hypoxic but does seem to be tolerating some wean of FiO2.  Now on 35% VM.  Dr. Delton Coombes spoke with the patient mother, she is aware that the patient is in supportive care only but transition to comfort care is a possibility if patient continues to fail to progress.  11-7 still desats x 3. Family not interested in trach.  Plan: - Has completed empiric course abx - Pulmonary toilet and vibravest, mobilize  as able - No indication for steroids here, no wheezing on exam - Would reconsider BiPAP if he can tolerate >> there is high risk of aspiration, but he has significant LLL and R basilar atelectasis that are likely barriers to any improvement here.  - Code status noted, no intubation, BiPAP is not refused by mother.  -Palliative care following.  -High flow O2 as needed  Seizure disorder Cerebral Palsy Plan: -per primary SVC  Dispo -- per primary.  Options are LTAC v home.  His mother very much wants to take him home which may be more feasible as he is requiring less O2.   Cont efforts to wean O2 as tol.  Palliative care following.  Seems as though mom is reasonable but not quite ready to transition to  comfort care.    [name] Minor ACNP [facility] PCCM Pager [phone] till 3 pm If no answer page [phone] 01/01/2012, 1:07 PM  Patient seen and examined, agree with above note.  I dictated the care and orders written for this patient under my direction.  [name], M.D. [phone]

## 2012-01-01 NOTE — Progress Notes (Signed)
Clinical Social Worker reviewed chart and noted current plan is for home with home health versus LTAC.  This CSW to sign off at this time, please re consult if needed.   Angelia Mould, MSW, [place] [phone]

## 2012-01-01 NOTE — Progress Notes (Signed)
FMTS Daily Intern Progress Note  Subjective: PEG tube revised yesterday, but patient's medications delayed from scheduled 2pm time and he had a 1 minute seizure in the afternoon, with no further seizure activity.  Had tachycardia to 150s throughout the day yesterday with multiple manipulations throughout day (respiratory therapy, revising PEG tube).  Smiling this morning and tachycardia resolving.  Buttocks still very irritated-appearing per mom.  I have reviewed the patient's medications.  Objective Temp:  [97.2 F (36.2 C)-98.9 F (37.2 C)] 97.3 F (36.3 C) (11/07 0723) Pulse Rate:  [86-151] 113  (11/07 0723) Resp:  [13-26] 17  (11/07 0723) BP: (96-134)/(64-87) 111/69 mmHg (11/07 0723) SpO2:  [93 %-99 %] 93 % (11/07 0804) FiO2 (%):  [40 %] 40 % (11/07 0804) Weight:  [95 lb 14.4 oz (43.5 kg)] 95 lb 14.4 oz (43.5 kg) (11/07 0341)   Intake/Output Summary (Last 24 hours) at 01/01/12 0935 Last data filed at 01/01/12 0900  Gross per 24 hour  Intake    929 ml  Output      0 ml  Net    929 ml    General: Lying in bed, awake and smiling, NAD  HEENT: Normocephalic. Lots of upper airway/nasal sounds, MMM, EOM with left eye drifting to left but otherwise movements in tact; sclera clear CV: Regular rhythm, mildly tachycardic, no murmurs, rubs, gallops heard  Pulm: Right lung with decreased breath sounds, but overall poor effort, shallow breaths, breathing mask on, loud upper airway breath sounds transmitted into lungs  Ext: Contractured arms and legs  Neuro: Asleep and did not attempt to wake patient  No new labs and Imaging   Assessment and Plan - [name] is a 21 y.o. year old male with cerebral palsy, mental retardation, and seizure disorder with persistent hypoxemia/acute respiratory failure secondary to viral vs. bacterial pneumonia, s/p antibiotics and receiving supportive care, with lengthy duration of symptoms.   1. Pneumonia (Aspiration vs CAP vs viral)/acute respiratory  failure -Required some increase of oxygen to 12L and 40% FiO2.  - Completed 8 days antibiotics with levaquin 750 mg 12/26/11.  - Guaifenesin for secretions  - Xopenex q 2 hrs PRN SOB and scheduled q 4 hrs.  - Continue Chest PT Q4 hours, RT recommendations in regards to his respiration status.  - Greatly appreciate pulmonology team's input regarding patient's poor reserve capacity. Will continue supportive care and f/u options of LTAC at [facility] per mom's request, hopefully short-term and then return home - f/u with CM to f/u on placement  - Greatly appreciate Dr. Ladona Ridgel with Palliative Care meeting with pt and mother to discuss long-term goals of care. Noted the detailed planning and new orders initiated with scheduled tylenol, RT, and Kinair bed. F/u regarding placement v home   2. Seizure Disorder - 1 grand-mal seizure yesterday, likely due to decreased seizure threshold with current illness and delayed medication dosing due to PEG tube malfunction - Spoke with Dr. Sharene Skeans who sees patient long-term; recommended re-adding PRN diazepam PR if generalized seizure activity >2 minutes - Continue depakote home dosing. Valproic Acid level 70 on admission.  - Mom comfortable with no formal neuro consult today  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 OSA - Continue nonrebreather mask overnight due to desaturations. Consider BiPAP   5. Tachycardia - persisted throughout yesterday but today improving; likely due to discomfort and respiratory illness.  - Consider ABG to evaluate for hypercarbia if continues with tachycardia, and possible CPAP  or BiPAP short-term if hypercarbic  6. Loose stools - negative c difficile PCR and no further reports of loose stool; buttocks red and irritated -moisture damage v candidiasis  - Has barrier cream and kinair bed; Added nystatin cream; consider adding antifungal PO  FEN/GI: Tube feeds per nutrition. Home Prilosec.  - IR revised PEG  tube yesterday   Prophylaxis: Lovenox 40 mg SQ   Disposition: SDU given nonrebreather requirement; continue discussion regarding LTAC placement (hopefully short-term) if failure to improve O2 requirements.  Patient needing regular RT/chest PT, suctioning, seizure management, PEG tube management if malfunction, and O2 administration to maintain O2 sat  Code Status: DNI, no chest compressions, no defibrillation with ACLS, IS expecting intervention on BP and irregular non-life threatening arrhythmias with medication; speaking with spouse re: pacemakers or cardioversion - f/u on this and on documentation in EPIC/pt's chart; Very much appreciate consult by pallitative care Dr. Truman Hayward Pager: [phone] 01/01/2012, 9:35 AM

## 2012-01-01 NOTE — Progress Notes (Signed)
Nutrition Follow-up  Intervention:   1.  Enteral nutrition; continue current management.  Continue MVI.  Pt has resume regimen after PEG revision yesterday.  Assessment:   Pt admitted with respiratory difficulty.  Continues to work towards d/c home with mom. Pt resting comfortably at time of visit. Pt was resumed home regimen on admission with addition of MVI which pt is tolerating.  Mom reported some clogging at last visit, and PEG was revised yesterday without complication.  Residuals:  10 mL (11/7). Last BM (11/7), several loose BMs.  Pt with some irritation to buttocks which is being addressed by team.  Pt remains in step down due to respiratory distress. Still requiring venturi mask.  Diet Order:  Osmolite 1.2 @ 37 mL/hr continuous per home regimen.  Meds: Scheduled Meds:    . acetaminophen (TYLENOL) oral liquid 160 mg/5 mL  650 mg Oral Q8H  . antiseptic oral rinse  15 mL Mouth Rinse BID  . baclofen  20 mg Per Tube TID WC  . divalproex  250 mg Oral Daily  . divalproex  375 mg Oral BID  . enoxaparin (LOVENOX) injection  30 mg Subcutaneous Q24H  . free water  30 mL Per Tube Q2H  . gabapentin  300 mg Per Tube Custom  . Gerhardt's butt cream  1 application Topical BID  . glycopyrrolate  1 mg Per Tube BID  . guaiFENesin  200 mg Per Tube Q6H  . lactobacillus  2 g Per Tube TID  . levalbuterol  0.63 mg Nebulization Q4H  . multivitamin  5 mL Per Tube Daily  . pantoprazole sodium  40 mg Per Tube Daily   Continuous Infusions:    . feeding supplement (OSMOLITE 1.2 CAL) 1,000 mL (12/31/11 0511)   PRN Meds:.acetaminophen (TYLENOL) oral liquid 160 mg/5 mL, diazepam, [COMPLETED] iohexol, levalbuterol, naphazoline, nystatin cream, [DISCONTINUED] diazepam   CMP     Component Value Date/Time   NA 135 12/28/2011 0500   K 4.8 12/28/2011 0500   CL 98 12/28/2011 0500   CO2 31 12/28/2011 0500   GLUCOSE 102* 12/28/2011 0500   BUN 8 12/28/2011 0500   CREATININE 0.20* 12/31/2011 0555   CALCIUM  10.0 12/28/2011 0500   GFRNONAA >90 12/31/2011 0555   GFRAA >90 12/31/2011 0555    CBG (last 3)  No results found for this basename: GLUCAP:3 in the last 72 hours   Intake/Output Summary (Last 24 hours) at 01/01/12 1217 Last data filed at 01/01/12 1100  Gross per 24 hour  Intake   1033 ml  Output      0 ml  Net   1033 ml    Weight Status:  95 lbs, overall stable  Re-estimated needs:  1000-1200 kcal, 36-45g protein  Nutrition Dx:  Inadequate oral intake r/t inability to eat, ongoing.  Not likely to resolve given medical condition and cognition/developemental delay  Monitor:   Goal: EN to meet >90% of estimated needs.  Monitor: TF tolerance, weights, labs   Loyce Dys, MS RD LDN Clinical Inpatient Dietitian Pager: [phone] Weekend/After hours pager: [phone]

## 2012-01-02 MED ORDER — NYSTATIN 100000 UNIT/GM EX CREA
TOPICAL_CREAM | Freq: Two times a day (BID) | CUTANEOUS | Status: DC
  Administered 2012-01-02 – 2012-01-04 (×4): via TOPICAL
  Administered 2012-01-04 – 2012-01-05 (×2): 1 via TOPICAL
  Filled 2012-01-02: qty 15

## 2012-01-02 NOTE — Progress Notes (Signed)
Seizure activity as stated per Mom. Pt opbserved staring off

## 2012-01-02 NOTE — Progress Notes (Signed)
Name: [name] MRN: [id number] DOB: [birth year]    LOS: 16  Referring Provider:  Dr Sheffield Slider, FPTS Reason for Referral:  Respiratory distress, hypoxemia   PULMONARY / CRITICAL CARE MEDICINE  HPI:  21 y/o man with cerebral palsy, MR, seizures, known to PCCM -followed by Dr. Delton Coombes. He was admitted to [facility] on 10/23 with few days of increased secretions, resp difficulty requiring albuterol (which he often does not require) and low grade fevers. He has been treated for possible HCAP. Course c/b increase freq seizures, some agitation associated with resp distress and desaturations. No real improvement in oxygen needs or WOB despite supportive care and rx for suspected bacterial PNA   Subjective:   Now tol 40%VM.     Vital Signs: Temp:  [97.3 F (36.3 C)-98.1 F (36.7 C)] 97.4 F (36.3 C) (11/08 1127) Pulse Rate:  [70-133] 93  (11/08 1127) Resp:  [10-21] 11  (11/08 1127) BP: (84-134)/(54-85) 108/70 mmHg (11/08 1203) SpO2:  [93 %-100 %] 97 % (11/08 1203) FiO2 (%):  [24 %-40 %] 24 % (11/08 1203) Weight:  [44.8 kg (98 lb 12.3 oz)] 44.8 kg (98 lb 12.3 oz) (11/08 0500)  Intake/Output Summary (Last 24 hours) at 01/02/12 1431 Last data filed at 01/02/12 0300  Gross per 24 hour  Intake    661 ml  Output      2 ml  Net    659 ml    Physical Examination: General:  Ill appearing, non-communcative but able to move purposefully. Neuro:  Quadriplegia, some eye movements HEENT:  [place]/AT, PERRL, EOM-I, -LAN and -thyromegally. Neck: Supple, -LAN and -thyromegally. Cardiovascular:  Tachy, regular Lungs: Not weaning well but no further accessory muscle use. Abdomen: soft, round Musculoskeletal:  contracted Skin:  No wounds or rashes  Principal Problem:  *Community acquired pneumonia Active Problems:  MENTAL RETARDATION  SLEEP APNEA, OBSTRUCTIVE  CEREBRAL PALSY  SOB (shortness of breath)  Acute and chronic respiratory failure  Pain, generalized  Increased oropharyngeal secretions  Lab  12/31/11 0555 12/28/11 0500  NA -- 135  K -- 4.8  CL -- 98  CO2 -- 31  GLUCOSE -- 102*  BUN -- 8  CREATININE 0.20* <0.20*  CALCIUM -- 10.0  MG -- --  PHOS -- --   Lab 12/28/11 0500  HGB 14.2  HCT 42.1  WBC 10.3  PLT 407*   Ventilator Settings: Vent Mode:  [-]  FiO2 (%):  [24 %-40 %] 24 %  Intake/Output Summary (Last 24 hours) at 01/02/12 1431 Last data filed at 01/02/12 0300  Gross per 24 hour  Intake    661 ml  Output      2 ml  Net    659 ml   CXR:  No new CXR  Impressions/Recommendations:   Acute hypoxic respiratory failure  OSA Likely due to infectious process (viral vs superimposed bacterial PNA) in a debilitated man with no pulmonary reserve. Has been afebrile without leukocytosis. If this is a viral process, time course will be 10-14 days which seems to make more sense now since patient is improving.  Hypoxemia much improved, on 24% FiO2 (3L with mask since patient is a mouth breather).Family not interested in trach/intubation.  Rejected for LTAC.  Plan: - Has completed empiric course abx - Pulmonary toilet and vibravest, mobilize as able - No indication for steroids here, no wheezing on exam - If patient continues to be stable over the weekend then would be ok to d/c home on Monday.  - Titrate  O2 as needed.  Seizure disorder Cerebral Palsy Plan: - Per primary SVC.  Dispo -- per primary.  But if patient continues to improve as such then will be ready to d/c home by Monday.  Case management arranging home needs but patient has a vest, O2 and suction already set up at home, will only need mask (this is not the classic high flow mask, it is set on 3L since patient is a mouth breather).  Will see again on Monday.  Alyson Reedy, M.D. [facility] Pulmonary/Critical Care Medicine. Pager: [phone]. After hours pager: [phone].

## 2012-01-02 NOTE — Progress Notes (Signed)
[facility]  Patient Status: New  [facility] is providing the following services: RN  If patient discharges after hours, please call [phone].   Kizzie Furnish 01/02/2012, 5:22 PM

## 2012-01-02 NOTE — Progress Notes (Signed)
Family Medicine Teaching Service Attending Note  I interviewed and examined patient [name] and reviewed their tests and x-rays.  I discussed with Dr. Benjamin Stain and reviewed their note for today.  I agree with their assessment and plan.     Additionally  Awake no acute distress Mom feels is doing well the last 48 hrs Will attempt [place] O2 if does not tolerate investigate home O2 via mask by Home Health

## 2012-01-02 NOTE — Progress Notes (Signed)
Family Medicine Teaching Service Attending Note  On 11/7 I interviewed and examined patient [name] and reviewed their tests and x-rays.  I discussed with Dr. Benjamin Stain and reviewed their note for today.  I agree with their assessment and plan.

## 2012-01-02 NOTE — Progress Notes (Signed)
FMTS Daily Intern Progress Note  Subjective:  Patient doing well with decreased oxygen requirement today, though mom annoyed because someone turned oxygen up for O2 sat of 89% which patient tolerates regularly.  Mom okay with home or LTAC, but does not want BiPAP.    I have reviewed the patient's medications.  Objective Temp:  [97.3 F (36.3 C)-98.1 F (36.7 C)] 97.4 F (36.3 C) (11/08 1127) Pulse Rate:  [70-133] 93  (11/08 1127) Resp:  [10-21] 11  (11/08 1127) BP: (84-134)/(54-85) 108/70 mmHg (11/08 1203) SpO2:  [93 %-100 %] 97 % (11/08 1203) FiO2 (%):  [24 %-40 %] 24 % (11/08 1203) Weight:  [98 lb 12.3 oz (44.8 kg)] 98 lb 12.3 oz (44.8 kg) (11/08 0500)   Intake/Output Summary (Last 24 hours) at 01/02/12 1444 Last data filed at 01/02/12 0300  Gross per 24 hour  Intake    661 ml  Output      2 ml  Net    659 ml    General: Lying in bed, awake, NAD  HEENT: Normocephalic. Lots of upper airway/nasal sounds, MMM, EOM with left eye drifting to left but otherwise movements in tact; sclera clear CV: Regular rhythm, mildly tachycardic, no murmurs, rubs, gallops heard  Pulm: Decreased breath sounds in bases, poor effort, shallow breaths, breathing mask on, loud upper airway breath sounds transmitted into lungs  Ext: Contractured arms and legs  Neuro: Awake  No new labs and Imaging   Assessment and Plan - [name] is a 21 y.o. year old male with cerebral palsy, mental retardation, and seizure disorder with persistent hypoxemia/acute respiratory failure secondary to viral vs. bacterial pneumonia, s/p antibiotics and receiving supportive care, with lengthy duration of symptoms.   1. Pneumonia (Aspiration vs CAP vs viral)/acute respiratory failure - Oxygen at 6L 30% FiO2; attempt nasal cannula 2-4 L to keep O2 sat >92% - Completed 8 days antibiotics with levaquin 750 mg 12/26/11.  - Guaifenesin for secretions, Xopenex q 2 hrs PRN SOB and scheduled q 4 hrs.  - Chest PT Q4 hours, RT  recommendations in regards to his respiration status.  - Greatly appreciate pulmonology team's input. Pt with poor reserve capacity. Will continue supportive care and f/u options of LTAC at [facility] though [facility] denying - Greatly appreciate Dr. Ladona Ridgel with Palliative Care meeting with pt and mother to discuss long-term goals of care. Noted the detailed planning and new orders initiated with scheduled tylenol, RT, and Kinair bed. F/u regarding placement v home   2. Seizure Disorder - Stable; Spoke with Dr. Sharene Skeans who sees patient long-term; recommended re-adding PRN diazepam PR if generalized seizure activity >2 minutes - Continue depakote home dosing. Valproic Acid level 70 on admission.   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 OSA - Continue nonrebreather mask overnight due to desaturations. Consider BiPAP   5. Tachycardia - 01/01/12 throughout day; resolved today; likely due to discomfort and respiratory illness.  - Consider ABG to evaluate for hypercarbia if continues with tachycardia, and possible CPAP or BiPAP short-term if hypercarbic though mom does not want this now  6. Loose stools - negative c difficile PCR and no further reports of loose stool; buttocks red and irritated -moisture damage v candidiasis  - Has barrier cream and kinair bed; Added nystatin cream and ketoconazole 100mg  PO x 1 yesterday  FEN/GI: Tube feeds per nutrition. Home Prilosec.  - IR revised PEG tube 11/6  Prophylaxis: Lovenox 40 mg SQ  Disposition: SDU given nonrebreather requirement - Discussed LTAC with [employer] peer-to-peer Dr. Oris Drone and denying.  - Plan to attempt O2 by [place] and if patient tolerates, discharge from hospital to home with O2 by [place] - If does not tolerate, replace venturi mask and CM to set up Home Health RN with DME for O2 with venturi mask on current settings at discharge - If patient still here by Monday, re-contact [employer] Dr. Oris Drone [phone] x [phone]) to reconsider  LTAC at [facility] - Patient needing regular RT/chest PT, suctioning, seizure management, PEG tube management if malfunction, and O2 administration to maintain O2 sat  Code Status: Partial DNR/DNI, no chest compressions, no defibrillation with ACLS, IS expecting intervention on BP and irregular non-life threatening arrhythmias with medication; speaking with spouse re: pacemakers or cardioversion - f/u on this and on documentation in EPIC/pt's chart; Very much appreciate consult by pallitative care Dr. Truman Hayward Pager: [phone] 01/02/2012, 2:44 PM

## 2012-01-02 NOTE — Progress Notes (Signed)
Palliative Care Team at [facility] Progress Note   SUBJECTIVE: Currently, the patient remains in SDU on venti-mask but has made significant improvement in weaning his O2 requirements and actually was able to tolerate room air today for a longer period of time. His mother feels he is much closer to his baseline, she is at the bedside providing history and information. [name] was able to laugh and play with his family this morning.  Interval Events: 21 yo man with CP admitted with acute respiratory failure and pneumonia.   OBJECTIVE: Vital Signs: BP 103/59  Pulse 95  Temp 97.4 F (36.3 C) (Axillary)  Resp 12  Ht 4' 9.87" (1.47 m)  Wt 44.8 kg (98 lb 12.3 oz)  BMI 20.73 kg/m2  SpO2 100%   Intake and Output: 11/07 0701 - 11/08 0700 In: 1040 [NG/GT:300] Out: 2 [Urine:1; Stool:1]  Physical Exam: General:  debilitated, thin, chronically ill appearing  Head:  normal  Lungs:   diffuse rhonchi  Heart: Tachycardic regular  Abdomen:  Soft, ND  Extremities: No edema, peg site ok    Allergies  Allergen Reactions  . Sulfamethoxazole W-Trimethoprim Rash    "don't know how bad" /father 12/17/2011    Medications: Scheduled Meds:     . acetaminophen (TYLENOL) oral liquid 160 mg/5 mL  650 mg Oral Q8H  . antiseptic oral rinse  15 mL Mouth Rinse BID  . baclofen  20 mg Per Tube TID WC  . divalproex  250 mg Oral Daily  . divalproex  375 mg Oral BID  . enoxaparin (LOVENOX) injection  30 mg Subcutaneous Q24H  . free water  30 mL Per Tube Q2H  . gabapentin  300 mg Per Tube Custom  . Gerhardt's butt cream  1 application Topical BID  . glycopyrrolate  1 mg Per Tube BID  . guaiFENesin  200 mg Per Tube Q6H  . lactobacillus  2 g Per Tube TID  . levalbuterol  0.63 mg Nebulization Q4H  . multivitamin  5 mL Per Tube Daily  . nystatin cream   Topical BID  . pantoprazole sodium  40 mg Per Tube Daily    Continuous Infusions:    . feeding supplement (OSMOLITE 1.2 CAL) 1,000 mL (12/31/11  0511)    PRN Meds: acetaminophen (TYLENOL) oral liquid 160 mg/5 mL, diazepam, levalbuterol, naphazoline, nystatin cream  Stool [place]: yes  Palliative Performance Scale: 20%   Pain Present?: no   Labs: CBC    Component Value Date/Time   WBC 10.3 12/28/2011 0500   RBC 4.54 12/28/2011 0500   HGB 14.2 12/28/2011 0500   HCT 42.1 12/28/2011 0500   PLT 407* 12/28/2011 0500   MCV 92.7 12/28/2011 0500   MCH 31.3 12/28/2011 0500   MCHC 33.7 12/28/2011 0500   RDW 13.5 12/28/2011 0500   LYMPHSABS 2.2 12/24/2011 0845   MONOABS 1.4* 12/24/2011 0845   EOSABS 0.1 12/24/2011 0845   BASOSABS 0.0 12/24/2011 0845    CMET     Component Value Date/Time   NA 135 12/28/2011 0500   K 4.8 12/28/2011 0500   CL 98 12/28/2011 0500   CO2 31 12/28/2011 0500   GLUCOSE 102* 12/28/2011 0500   BUN 8 12/28/2011 0500   CREATININE 0.20* 12/31/2011 0555   CALCIUM 10.0 12/28/2011 0500   GFRNONAA >90 12/31/2011 0555   GFRAA >90 12/31/2011 0555     Imaging: Chest Xray Reviewed interval improvement.  ASSESSMENT/ PLAN:  [name] seems to be improving. I had a long discussion with  his mother about options for taking him home including the possibility of hospice, especially if he could maintain CAPS and have hospice benefit covered under his BCBS policy in a similar way that children eroll and maintain hospice benefits with more flexible guidelines, even though he is 21 his emotional maturity and development is delayed and he may actually benefit from something like [facility]-and also to support his family in difficult decision making ahead. This acute event lead to a new normal state for [name] that is very different from before he came in the hospital and was able to go to a young adult daycare program for people with disabilities. At this point he would need close supervision at home and the family is willing to provide this. Dr. Molli Knock at bedside as well to discuss pulmonary status and expectations.  Summary of Recs:  1. [name]  would be best served at home, for the sake of his QOL and his mother has a high level of confidence to do this if his O2 needs are manageable at home- right now they are-plan from pulm is to watch over weekend and if stable to support a discharge home with O2 and current neb schedule. At this point he may not need LTAC or another care venue.  2. I discussed Hospice openly, not as a means to "just give up and die" as the mother presumued, but a means to better QOL in the setting of a terminal illness that he has had since childhood-with highly skilled medical and psychosocial support at home. Will need to check with BCBS and see if this is even a possibility. He cannot have both CAPS and Hospice paid for under Medicaid.  Thank you for allowing [place] to participate in his care, we will continue to follow him as needed and to assist with care coordination into hospice -I also did education with mother on the Hospice self referral process available 24/7.  Time: 35 minutes            Greater than 50%  of this time was spent counseling and coordinating care related to the above assessment and plan.   Edsel Petrin, DO  01/02/2012, 5:29 PM  Please contact Palliative Medicine Team phone at [phone] for questions and concerns.

## 2012-01-02 NOTE — Progress Notes (Signed)
Seizure activity per Mom. Pt . Noted to be staring off

## 2012-01-03 MED ORDER — LEVALBUTEROL HCL 0.63 MG/3ML IN NEBU
0.6300 mg | INHALATION_SOLUTION | Freq: Four times a day (QID) | RESPIRATORY_TRACT | Status: DC
  Administered 2012-01-03 – 2012-01-04 (×4): 0.63 mg via RESPIRATORY_TRACT
  Filled 2012-01-03 (×8): qty 3

## 2012-01-03 NOTE — Progress Notes (Signed)
Family Medicine Teaching Service Attending Note  I interviewed and examined patient [name] and reviewed their tests and x-rays.  I discussed with Dr. Casper Harrison and reviewed their note for today.  I agree with their assessment and plan.     Additionally  Doing well on [place] O2 Continue to wean O2 and bronchodilators

## 2012-01-03 NOTE — Plan of Care (Signed)
Problem: Discharge Progression Outcomes Goal: Able to self administer respiratory meds Outcome: Not Applicable Date Met:  01/03/12 Pt has severe MR; mother takes care of him

## 2012-01-03 NOTE — Progress Notes (Signed)
FMTS Daily Intern Progress Note  Subjective:  Pt seen at bedside, mother present in room. Pt has been tolerating 2L [place] well and per mom appears to be "closer to his baseline." Mom is glad to see improvement and inquires about possibility of trial off O2 and spacing out nebulizer treatments, today vs tomorrow; she states "he hasn't needed oxygen all the time at home, and if we can space the nebulizers out, too, that would be good to see if he can tolerated it, but we'll give him what he needs."   I have reviewed the patient's medications.  Objective Temp:  [97.4 F (36.3 C)-97.6 F (36.4 C)] 97.5 F (36.4 C) (11/09 0725) Pulse Rate:  [66-116] 66  (11/09 0725) Resp:  [11-20] 12  (11/09 0725) BP: (102-110)/(59-92) 102/63 mmHg (11/09 0725) SpO2:  [95 %-100 %] 97 % (11/09 0913) FiO2 (%):  [24 %-28 %] 28 % (11/08 1453) Weight:  [97 lb (44 kg)] 97 lb (44 kg) (11/09 0423)   Intake/Output Summary (Last 24 hours) at 01/03/12 0919 Last data filed at 01/03/12 0600  Gross per 24 hour  Intake   1107 ml  Output     30 ml  Net   1077 ml    General: Lying in bed, sleeping and awaking occasionally, NAD  HEENT: Normocephalic. Lots of upper airway/nasal sounds, MMM, EOMi with left eye drifting to left; sclera clear CV: Regular rhythm and rate this morning, no murmur appreciated Pulm: Decreased breath sounds in bases, poor effort/shallow breathing; on [place], breath sounds diffusely coarse with some transmitted upper airway sounds, but clearer than previous days (vs less transmitted sounds now that he is off mask) Ext: Contractured arms and legs, unchanged  Laboratory: No new labs or Imaging  Assessment and Plan - [name] is a 21 y.o. year old male with cerebral palsy, mental retardation, and seizure disorder with persistent hypoxemia/acute respiratory failure secondary to viral vs. bacterial pneumonia, s/p antibiotics and receiving supportive care, with lengthy duration of symptoms.   1.  Pneumonia (Aspiration vs CAP vs viral)/acute respiratory failure  - Oxygen now at 2L [place], to keep O2 sat >92% - Completed 8 days antibiotics with levaquin 750 mg 12/26/11.  - Guaifenesin for secretions, Xopenex q 2 hrs PRN SOB and scheduled q 4 hrs. Will discuss spacing to q6. - Chest PT Q4 hours, RT recommendations in regards to his respiration status.  - Greatly appreciate pulmonology team's input. Pt with poor reserve capacity. Will continue supportive care and f/u options of LTAC at [facility] though [facility]BS denying - Greatly appreciate Dr. Ladona Ridgel with Palliative Care meeting with pt and mother to discuss long-term goals of care. Noted the detailed planning and new orders initiated with scheduled tylenol, RT, and Kinair bed. F/u regarding placement v home   2. Seizure Disorder - Stable; Spoke with Dr. Sharene Skeans who sees patient long-term; recommended re-adding PRN diazepam PR if generalized seizure activity >2 minutes - Continue depakote home dosing. Valproic Acid level 70 on admission.   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 OSA - Continue nonrebreather mask overnight as needed for desaturations.  -Would consider BiPAP/CPAP but mom has declined previously  5. Tachycardia - 01/01/12 throughout day; resolved 11/8; likely due to discomfort and respiratory illness.  - Will consider ABG to evaluate for hypercarbia if any continued with tachycardia - Would also consider CPAP or BiPAP short-term if hypercarbic though mom does not want this now  6. Loose stools -  negative c difficile PCR and no further reports of loose stool; buttocks red and irritated -moisture damage v candidiasis  - Has barrier cream and kinair bed; Added nystatin cream and ketoconazole 100mg  PO x 1 yesterday  FEN/GI: Tube feeds per nutrition. Home Prilosec.  - IR revised PEG tube 11/6  Prophylaxis: Lovenox 40 mg SQ   Disposition: SDU given O2 requirement and poor pulmonary reserve - Discussed LTAC with  [employer] peer-to-peer Dr. Oris Drone and denying.  - Continue to wean O2; if patient tolerates, will discharge from hospital to home with O2 by [place]; If does not tolerate, replace venturi mask and CM to set up Home Health RN with DME for O2 with venturi mask at discharge - If patient still here by Monday, re-contact [employer] Dr. Oris Drone [phone]) to reconsider LTAC at [facility] - Patient needing regular RT/chest PT, suctioning, seizure management, PEG tube management if malfunction, and O2 administration to maintain O2 sat  Code Status: Partial DNR/DNI, no chest compressions, no defibrillation with ACLS - Pt's mother does desire/expect intervention on BP and irregular non-life threatening arrhythmias with medication; speaking with spouse re: pacemakers or cardioversion  - see documentation in EPIC/pt's chart; very much appreciate consult by pallitative care Dr. Sabino Dick, Kaianna Dolezal Pager: [phone] 01/03/2012, 9:19 AM

## 2012-01-04 MED ORDER — LEVALBUTEROL HCL 0.63 MG/3ML IN NEBU
0.6300 mg | INHALATION_SOLUTION | Freq: Four times a day (QID) | RESPIRATORY_TRACT | Status: DC
  Administered 2012-01-04 – 2012-01-05 (×5): 0.63 mg via RESPIRATORY_TRACT
  Filled 2012-01-04 (×8): qty 3

## 2012-01-04 MED ORDER — LEVALBUTEROL HCL 0.63 MG/3ML IN NEBU
0.6300 mg | INHALATION_SOLUTION | RESPIRATORY_TRACT | Status: DC | PRN
  Filled 2012-01-04: qty 3

## 2012-01-04 NOTE — Progress Notes (Signed)
Family Medicine Teaching Service Attending Note  I interviewed and examined patient [name] and reviewed their tests and x-rays.  I discussed with Dr. Benjamin Stain and reviewed their note for today.  I agree with their assessment and plan.     Additionally  Satting well on 1 left [place] Wean O2 and neb treatments Anticipate discharge to home tomorrow

## 2012-01-04 NOTE — Progress Notes (Signed)
FMTS Daily Intern Progress Note  Subjective:  Mom feels [name] is very close to his baseline pulmonary function, especially given he was transitioned to 1L O2 by [place] yesterday morning and has tolerated that well.  She would like to try to wean him off O2 today and continue spacing breathing treatment to see how he does with that prior to discharge.  She also is requesting documentation from [facility] team that states [name]'s expected needs for level of home care, which she believes to be high.  Reports his buttocks discomfort and redness improving with nystatin cream.  I have reviewed the patient's medications.  Objective Temp:  [96.7 F (35.9 C)-97.6 F (36.4 C)] 97.6 F (36.4 C) (11/10 0730) Pulse Rate:  [87-112] 90  (11/10 0730) Resp:  [13-20] 18  (11/10 0730) BP: (96-115)/(49-82) 96/49 mmHg (11/10 0730) SpO2:  [94 %-99 %] 98 % (11/10 0811) Weight:  [97 lb 8 oz (44.226 kg)] 97 lb 8 oz (44.226 kg) (11/10 0500)   Intake/Output Summary (Last 24 hours) at 01/04/12 1027 Last data filed at 01/04/12 0900  Gross per 24 hour  Intake   1031 ml  Output    100 ml  Net    931 ml    General: Lying in bed, awake, NAD  HEENT: Normocephalic. Lots of upper airway/nasal sounds, MMM, EOMI with left eye drifting to left; sclera clear; continuous low moaning. CV: Regular rhythm and rate this morning, no murmur appreciated Pulm: Decreased breath sounds in bases, poor effort/shallow breathing; on [place], breath sounds diffusely coarse with some transmitted upper airway sounds ABD: soft, nondistended, NABS Ext: Contractured arms and legs, unchanged  Laboratory: No new labs or Imaging  Assessment and Plan - [name] is a 21 y.o. year old male with cerebral palsy, mental retardation, and seizure disorder with persistent hypoxemia/acute respiratory failure secondary to viral vs. bacterial pneumonia, s/p antibiotics and receiving supportive care, with lengthy duration of symptoms.   1. Pneumonia (Aspiration  vs CAP vs viral)/acute respiratory failure  - Oxygen now at 1L , to keep O2 sat >92% - Completed 8 days antibiotics with levaquin 750 mg 12/26/11.  - Guaifenesin for secretions, Xopenex q 2 hrs PRN SOB and scheduled q6 spaced from q4 hrs yesterday. - Chest PT Q4 hours, RT recommendations in regards to his respiration status.  - Greatly appreciate pulmonology team's input. Pt with poor reserve capacity. Will continue supportive care and f/u options of home v LTAC at [facility] though [employer] denying - Greatly appreciate Dr. Ladona Ridgel with Palliative Care meeting with pt and mother to discuss long-term goals of care. Noted the detailed planning and new orders initiated with scheduled tylenol, RT, and Kinair bed. F/u regarding placement v home   2. Seizure Disorder - Stable; Spoke with Dr. Sharene Skeans who sees patient long-term; recommended re-adding PRN diazepam PR if generalized seizure activity >2 minutes - Continue depakote home dosing. Valproic Acid level 70 on admission.   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 OSA - Continue nonrebreather mask overnight as needed for desaturations.  - May benefit from BiPAP/CPAP but mom does not want this  5. Tachycardia - 01/01/12 throughout day; resolved 11/8; likely due to discomfort and respiratory illness.  - Will consider ABG to evaluate for hypercarbia if any continued with tachycardia - Would also consider CPAP or BiPAP short-term if hypercarbic though mom does not want this now  6. Loose stools - negative c difficile PCR and no further reports of  loose stool; buttocks red and irritated -moisture damage v candidiasis  - Has barrier cream and kinair bed; Added nystatin cream and ketoconazole 100mg  PO x 1 11/8, which have helped  FEN/GI: Tube feeds per nutrition. Home Prilosec.  - IR revised PEG tube 11/6  Prophylaxis: Lovenox 40 mg SQ   Disposition: SDU given O2 requirement and poor pulmonary reserve - Mom wants to continue weaning  O2 prior to sending home, per Dr Percival Spanish recs; Likely discharge tomorrow to home with O2 by [place] v O2 only PRN; If does not tolerate, replace venturi mask and CM to set up Home Health RN with DME for O2 with venturi mask at discharge - Discussed LTAC with [employer] peer-to-peer Dr. Oris Drone and denying.  - Monday, re-contact [employer] Dr. Oris Drone [phone] x [phone]) to reconsider LTAC at [facility] if needed - Patient needing regular RT/chest PT, suctioning, seizure management, PEG tube management if malfunction, and O2 administration to maintain O2 sat - mom would like note stating level of home care/skilled RN needed, if appropriate - F/u as outpatient with PCP and Dr. Delton Coombes  Code Status: Partial DNR/DNI, no chest compressions, no defibrillation with ACLS - Pt's mother does desire/expect intervention on BP and irregular non-life threatening arrhythmias with medication; speaking with spouse re: pacemakers or cardioversion  - see documentation in EPIC/pt's chart; very much appreciate consult by pallitative care Dr. Truman Hayward Pager: [phone] 01/04/2012, 10:27 AM

## 2012-01-04 NOTE — Plan of Care (Signed)
Problem: Discharge Progression Outcomes Goal: Tolerating diet Outcome: Not Applicable Date Met:  01/04/12 TF

## 2012-01-05 MED ORDER — ACETAMINOPHEN 160 MG/5ML PO SOLN: 325.0000 mg | Freq: Four times a day (QID) | ORAL | Status: DC | PRN

## 2012-01-05 MED ORDER — NYSTATIN 100000 UNIT/GM EX CREA: TOPICAL_CREAM | Freq: Two times a day (BID) | CUTANEOUS | Status: AC | PRN

## 2012-01-05 MED ORDER — LEVALBUTEROL HCL 0.63 MG/3ML IN NEBU: 0.6300 mg | INHALATION_SOLUTION | RESPIRATORY_TRACT | Status: DC | PRN

## 2012-01-05 NOTE — Progress Notes (Signed)
Name: [name] MRN: [id number] DOB: 05-Apr-1990    LOS: 19  Referring Provider:  Dr Sheffield Slider, FPTS Reason for Referral:  Respiratory distress, hypoxemia   PULMONARY / CRITICAL CARE MEDICINE  HPI:  21 y/o man with cerebral palsy, MR, seizures, known to PCCM -followed by Dr. Delton Coombes. He was admitted to [facility] on 10/23 with few days of increased secretions, resp difficulty requiring albuterol (which he often does not require) and low grade fevers. He has been treated for possible HCAP. Course c/b increase freq seizures, some agitation associated with resp distress and desaturations. No real improvement in oxygen needs or WOB despite supportive care and rx for suspected bacterial PNA   Subjective:   Now tol 40%VM.     Vital Signs: Temp:  [97.4 F (36.3 C)-98.6 F (37 C)] 97.9 F (36.6 C) (11/11 1218) Pulse Rate:  [86-119] 104  (11/11 1218) Resp:  [12-19] 16  (11/11 1218) BP: (96-126)/(60-78) 109/72 mmHg (11/11 1218) SpO2:  [93 %-100 %] 95 % (11/11 1218) FiO2 (%):  [21 %] 21 % (11/11 0745) Weight:  [44 kg (97 lb)] 44 kg (97 lb) (11/11 0500)  Intake/Output Summary (Last 24 hours) at 01/05/12 1308 Last data filed at 01/05/12 1200  Gross per 24 hour  Intake   1167 ml  Output      0 ml  Net   1167 ml    Physical Examination: General:  Ill appearing, non-communcative but able to move purposefully. Neuro:  Quadriplegia, some eye movements HEENT:  [place]/AT, PERRL, EOM-I, -LAN and -thyromegally. Neck: Supple, -LAN and -thyromegally. Cardiovascular:  Tachy, regular Lungs: Not weaning well but no further accessory muscle use. Abdomen: soft, round Musculoskeletal:  contracted Skin:  No wounds or rashes  Principal Problem:  *Community acquired pneumonia Active Problems:  MENTAL RETARDATION  SLEEP APNEA, OBSTRUCTIVE  CEREBRAL PALSY  SOB (shortness of breath)  Acute and chronic respiratory failure  Pain, generalized  Increased oropharyngeal secretions   Lab 12/31/11 0555  NA --  K  --  CL --  CO2 --  GLUCOSE --  BUN --  CREATININE 0.20*  CALCIUM --  MG --  PHOS --  No results found for this basename: HGB:3,HCT:3,WBC:3,PLT:3 in the last 168 hours Ventilator Settings: Vent Mode:  [-]  FiO2 (%):  [21 %] 21 %  Intake/Output Summary (Last 24 hours) at 01/05/12 1308 Last data filed at 01/05/12 1200  Gross per 24 hour  Intake   1167 ml  Output      0 ml  Net   1167 ml   CXR:  No new CXR  Impressions/Recommendations:   Acute hypoxic respiratory failure  OSA Likely due to infectious process (viral vs superimposed bacterial PNA) in a debilitated man with no pulmonary reserve. Has been afebrile without leukocytosis. If this is a viral process, time course will be 10-14 days which seems to make more sense now since patient is improving.  Hypoxemia much improved, on 24% FiO2 (3L with mask since patient is a mouth breather).Family not interested in trach/intubation.  Rejected for LTAC.  Plan: - Has completed empiric course abx - Pulmonary toilet and vibravest, mobilize as able - No indication for steroids here, no wheezing on exam - If patient continues to be stable over the weekend then would be ok to d/c home on Monday.  - Tolerating room air well.  Seizure disorder Cerebral Palsy Plan: - Per primary SVC.  Dispo -- Back to RA, obstructive apnea evident, patient has CPAP but mother  does not place on patient.  DC to home today, please arrange F/U with Dr. Delton Coombes as outpatient.  PCCM will sign off, please call back if needed.  Alyson Reedy, M.D. [facility] Pulmonary/Critical Care Medicine. Pager: [phone]. After hours pager: [phone].

## 2012-01-05 NOTE — Progress Notes (Signed)
Pt tolerated nebulizer treatment and Chest PT well. Mother oral suctioned with catheter. PT tolerated well. RT will continue to monitor.

## 2012-01-05 NOTE — Progress Notes (Signed)
Pt discharged to home. Family and belongings at bedside. Discharge, medication, f/u appt, and emergency instructions given. Pt's mother verbalized understanding. VS stable at time of discharge. No current questions or complaints. Modean Mccullum L

## 2012-01-06 NOTE — H&P (Signed)
Family Medicine Teaching [facility] Admission History and Physical  Patient name: [name] Medical record number: [id number]  Date of birth: 09-May-1990 Age: 21 y.o. Gender: male  Primary Care Provider: [facility], Marylene Land, MD  Assessment and Plan:  [name] is a 21 y.o. year old male with cerebral palsy, mental retardation, and seizure disorder presenting with increased work of breathing and hypoxemia.  #. Hypoxemia - Patient with likely respiratory infection, specifically community acquired pneumornia. He was given albuterol nebulizer treatment x 1 in the office, and he remained hypoxemia to 87% on room air.  - s/p CTX 1 gm IM x 1 in clinc, start azithromycin 500 mg today and 250 mg for 4 following days  - obtain chest X-ray, CBC, BMET  - Albuterol PRN  - continue glycopyrrolate daily  - O2 support  #. Seizure Disorder - Increasing frequency likely secondary to acute illness.  - Continue depakote at 3 caps per home regimen  #. Cerebral Palsy - continue home dose of baclofen, diazepam, and gabapentin  # Obstructive Sleep Apnea - Continue night time O2  #. FENGI - Patient needs a nutrition consultation for tube feeds; continue home omeprazole  #. PPX - levenox for DVT ppx  #. Disposition - Admit to [facility] Medicine Teaching Service on Med Surg Floor under Dr. Sheffield Slider  # Code - Limited. DNI.   Chief Complaint: Increased work of breathing   History of Present Illness: [name] is a 21 y.o. year old male with cerebral palsy, mental retardation, and seizure disorder presenting with increased work of breathing and fever. This started a a dry cough yesterday. He was noted to be febrile last night to 101. He was given motrin x 2, last time at 6AM this morning. His cough has now become productive, and his mother is concerned about a respiratory infection. She has not given him albuterol since last week, but believes that he currently needs it. His O2 saturation was 87% upon arrival  and did not improve with albuterol. According to his mother his saturation is in the 90's at well visits. He was given 1 gm of ceftriaxone IM in the office, but needs admission for his hypoxemia.  Patient Active Problem List   Diagnosis   .  MENTAL RETARDATION   .  SLEEP APNEA, OBSTRUCTIVE   .  CEREBRAL PALSY   .  CORTICAL BLINDNESS   .  HEARING IMPAIRMENT   .  CONGENITAL CYTOMEGALOVIRUS INFECTION   .  SEIZURE DISORDER   .  WHEELCHAIR DEPENDENCE   .  Dysphagia    Past Medical History:  Past Medical History   Diagnosis  Date   .  Allergy    .  Asthma    .  Cerebral palsy, quadriplegic    .  Congenital CMV infection    .  Pneumonia    .  Seizures    .  Neuromuscular disorder      cp   .  Community acquired pneumonia  06/30/2011     Psuedomonas PNA 06/2011 >tx 21 d of Cipro CXR 08/15/11 >clearance of PNA    Past Surgical History:  Past Surgical History   Procedure  Date   .  Nissen fundoplication    .  Rod     Social History:  History    Social History   .  Marital Status:  Single     Spouse Name:  N/A     Number of Children:  N/A   .  Years of Education:  N/A    Social History Main Topics   .  Smoking status:  Never Smoker   .  Smokeless tobacco:  Never Used   .  Alcohol Use:  No   .  Drug Use:  No   .  Sexually Active:  No    Other Topics  Concern   .  Not on file    Social History Narrative   .  No narrative on file    Family History:  Family History   Problem  Relation  Age of Onset   .  Allergies  Mother    .  Heart disease  Maternal Uncle    .  Colon cancer  Paternal Grandmother     Allergies:  Allergies   Allergen  Reactions   .  Sulfamethoxazole W-Trimethoprim     Current Outpatient Prescriptions   Medication  Sig  Dispense  Refill   .  albuterol (PROVENTIL) (2.5 MG/3ML) 0.083% nebulizer solution  Take 2.5 mg by nebulization every 6 (six) hours as needed. Shortness of breath     .  artificial tears (LACRILUBE) OINT ophthalmic ointment  Apply to  eye every 4 (four) hours as needed. Please provide bottle used in hospital     .  baclofen (LIORESAL) 10 MG tablet  TAKE 2 TABLETS INTO FEEDING TUBE 3 TIMES A DAY  180 tablet  11   .  diazepam (DIASTAT) 2.5 MG GEL  Unknown dose. Prescribed by Dr. Sharene Skeans, neurology     .  divalproex (DEPAKOTE SPRINKLE) 125 MG capsule  3 caps per tube at 6 am, 2 caps per tube at 2 pm, 3 caps per tube at 9 pm     .  Feeding Tubes - Pump (KANGAROO JOEY ENTERAL PUMP) MISC  30 Units by Does not apply route daily. Please provide bags for Kangroo Joey enteral feeding device. Should be changed daily.  30 each  12   .  gabapentin (NEURONTIN) 300 MG capsule  1 tab per tube three times a day     .  glycopyrrolate (ROBINUL) 1 MG tablet  1 TAB PER TUBE THREE TIMES A DAY FOR LUNG SECRETIONS  90 tablet  11   .  Hydrocortisone (GERHARDT'S BUTT CREAM) CREA  Apply 1 application topically 2 (two) times daily. Please provide remained of tube used in hospital  1 each  2   .  omeprazole (PRILOSEC) 2 mg/mL SUSP  Take 10 mLs (20 mg total) by mouth daily at 12 noon.  150 mL  3   .  PROVENTIL HFA 108 (90 BASE) MCG/ACT inhaler  USE 2 PUFFS EVERY 4 HOURS AS NEEDED FOR WHEEZE  1 Inhaler  0    Current Facility-Administered Medications   Medication  Dose  Route  Frequency  Provider  Last Rate  Last Dose   .  albuterol (PROVENTIL) (2.5 MG/3ML) 0.083% nebulizer solution 2.5 mg  2.5 mg  Nebulization  Once  Garnetta Buddy, MD   2.5 mg at 12/17/11 1101    Review Of Systems: Per HPI with the following additions: none  Otherwise 12 point review of systems was performed and was unremarkable.  Physical Exam:  BP 104/68  Pulse 122  Temp 97.8 F (36.6 C) (Axillary)  SpO2 87%  General: alert, mild distress and wheelchair bound, non communicative  HEENT: plagiocephalic, disconjugate gaze, poor dentition, OP with anatomical obstruction, no submandibular or submental lymphadenopathy  Heart: S1, S2  normal, no murmur, rub or gallop, regular rate and  rhythm  Lungs: increased work of breathing with subcostal retraction, decreased breath sounds in LLL, rhonchi throughout lungs, no wheezing or rales  Abdomen: gastric tube in left periumbilical area, well healed midline incision  Extremities: atrophied with contractures  Skin:no rashes, no wounds  Neurology: quadriparesis noted, had multiple seizures  Labs and Imaging:  No results found for this or any previous visit (from the past 24 hour(s)).

## 2012-01-06 NOTE — Discharge Summary (Signed)
Family Medicine Teaching Service  Discharge Note : Attending Jeff Carolos Fecher MD Pager [phone] Inpatient Team Pager:  [phone]  I have reviewed this patient, reviewed their chart and discussed discharge planning with the resident at the time of discharge. I agree with the discharge plan as above.  

## 2012-01-09 ENCOUNTER — Encounter: Payer: Self-pay | Admitting: Family Medicine

## 2012-01-09 ENCOUNTER — Ambulatory Visit (INDEPENDENT_AMBULATORY_CARE_PROVIDER_SITE_OTHER): Payer: BC Managed Care – PPO | Admitting: Family Medicine

## 2012-01-09 VITALS — BP 104/69 | HR 58 | Temp 97.5°F

## 2012-01-09 DIAGNOSIS — J9620 Acute and chronic respiratory failure, unspecified whether with hypoxia or hypercapnia: Secondary | ICD-10-CM

## 2012-01-09 DIAGNOSIS — Z515 Encounter for palliative care: Secondary | ICD-10-CM

## 2012-01-09 MED ORDER — GLYCOPYRROLATE 1 MG PO TABS: 1.0000 mg | ORAL_TABLET | Freq: Two times a day (BID) | ORAL | Status: DC

## 2012-01-09 NOTE — Assessment & Plan Note (Signed)
Patient's mother given information about [facility] [phone]; [facility]). Reading palliative care team's notes, they were considering this as an option for the patient. Question about whether patient's insurance would cover home hospice services.

## 2012-01-09 NOTE — Assessment & Plan Note (Signed)
He seems to be near his baseline. He is not requiring any oxygen during the daytime and maintains his oxygen saturations. He is only receiving oxygen at nighttime per routine for his sleep apnea.  He has follow-up with Dr. Delton Coombes his pulmonologist on 11/21.  Mother has dropped-off paperwork for FMLA. I have filled-out, requested a copy be made and scanned, and asked the mother to pick-up on Monday afternoon.

## 2012-01-09 NOTE — Progress Notes (Signed)
  Subjective:    Patient ID: [name], male    DOB: 1990-07-08, 21 y.o.   MRN: [id number]  HPI # Hospital follow-up for acute on chronic respiratory failure. He was hospitalized 10/23-11/12/2011. It was thought he had a viral infection and mucous plugging resulting in his hypoxia and prolonged hospital stay.  Since his discharge, his mother reports that he has been doing "ok". She checks his oxygen level intermittently when he has concerning symptoms, however, it remains above 90%. He has only been requiring oxygen at nighttime per routine for his sleep apnea.  She is concerned that he remains more tired than usual and sleep most of the day. A few days prior to his charge, he was more animated than earlier in his hospitalization, but since being at home, he sleeps most of the time.  She also has noticed a weak cough. She suctions him occasionally for this.   Review of Systems Denies seizures  Allergies, medication, past medical history reviewed.      Objective:   Physical Exam GEN: NAD; asleep most of the interview, he is somewhat arouseable and opens his eyes momentarily  PULM: NI WOB; mild ronchi; fair air movement to normal (not deep respirations) without obvious crackles or wheezing    Assessment & Plan:

## 2012-01-09 NOTE — Patient Instructions (Addendum)
Follow up in 3 months or sooner if needed

## 2012-01-15 ENCOUNTER — Ambulatory Visit (INDEPENDENT_AMBULATORY_CARE_PROVIDER_SITE_OTHER): Payer: BC Managed Care – PPO | Admitting: Emergency Medicine

## 2012-01-15 ENCOUNTER — Encounter: Payer: Self-pay | Admitting: Emergency Medicine

## 2012-01-15 VITALS — BP 100/60 | HR 85 | Temp 97.5°F

## 2012-01-15 DIAGNOSIS — G4733 Obstructive sleep apnea (adult) (pediatric): Secondary | ICD-10-CM

## 2012-01-15 DIAGNOSIS — J9620 Acute and chronic respiratory failure, unspecified whether with hypoxia or hypercapnia: Secondary | ICD-10-CM

## 2012-01-15 NOTE — Progress Notes (Signed)
  Subjective:    Patient ID: KC SUMMERSON, male    DOB: 1990/05/03, 21 y.o.   MRN: [id number]  HPI 21 yo man, hx of cerebral palsy. He is dependent for his care, lives at home with mom and dad. Had been followed for secretion management, recurrent PNA's by Dr Donnie Coffin at [facility]. Never required intubation. Followed by Dr Sharene Skeans with Neurology. He gets suctioned occasionally but not frequently (few times a month). Not currently using pulmicort nebs at this time. He is on albuterol nebs prn. Uses chest vest bid. Condition Complicated by impaired secretion management    Post Hospital 08/19/11 --  Admitted 5/6-5/14 for CAP -Psuedomonas. Tx w/ 21 days of Cipro.  Pulmonary consult to discuss possible trach placement , family declined w/ DNI now in place. Placed on O2 at night at discharge.  Seen by [facility] 6/21 -cxr follow up 6/21 w/ clearance of PNA  Does Vest Twice daily    ROV 11/04/11 -- Hx cerebral palsy, recurrent PNA's. Severe CAP as above, now recovered. He is being suctioned rarely. His family had conversations about code status, trach, etc during hospitalization. They do not want him ventilated or to receive CPR, but they MIGHT accept trach if it were for maintenance like suctioning etc.   ROV 01/15/12 -- Hx cerebral palsy, recurrent PNA's. He underwent a long admission, discharged on 11/11, for severe PNA. He is now back home. He is wearing O2 at night, still shows signs of OSA but very difficult to treat. She is doing chest vest 4x a day.  He has not been to adult daycare since d/c to home.       Objective:   Physical Exam Filed Vitals:   01/15/12 1328  BP: 100/60  Pulse: 85  Temp: 97.5 F (36.4 C)   Gen: Debilitated man in wheelchair, non-communicative  ENT: No lesions,  mouth clear,  oropharynx clear, poor dentition, no postnasal drip, L eye lateral gaze  Neck: No stridor, UA appears to be clear, snoring respirations when he dozes  Lungs: No use of accessory muscles, few  basilar crackles  Cardiovascular: RRR, heart sounds normal, no murmur or gallops, no peripheral edema  Abdomen: PEG in place.   Musculoskeletal: No deformities, no cyanosis or clubbing  Neuro: alert, non focal  Skin: Warm, no lesions or rashes     Assessment & Plan:  SLEEP APNEA, OBSTRUCTIVE Unable to wear CPAP, wears O2 at night  Acute and chronic respiratory failure High risk for PNA's. Seems to be manage secretions adequately.  - chest vest 4x a day - will continue the glycopyrrolate twice a day - go back to albuterol (from xopenex)

## 2012-01-15 NOTE — Assessment & Plan Note (Signed)
Unable to wear CPAP, wears O2 at night

## 2012-01-15 NOTE — Assessment & Plan Note (Signed)
High risk for PNA's. Seems to be manage secretions adequately.  - chest vest 4x a day - will continue the glycopyrrolate twice a day - go back to albuterol (from xopenex)

## 2012-01-15 NOTE — Patient Instructions (Addendum)
Please continue to do chest PT 4x a day for now. We may decide to decrease this at some point.  Use glycopyrrrlate twice a day Chang the xopenex nebs back to albuterol nebs.  Follow with Dr Delton Coombes in January 2014.

## 2012-02-10 ENCOUNTER — Telehealth: Payer: Self-pay | Admitting: Family Medicine

## 2012-02-10 NOTE — Telephone Encounter (Signed)
Needs for [place] to fax orders to [facility] for his incontinence supplies.  Fax # [phone]

## 2012-02-11 MED ORDER — INCONTINENCE SUPPLIES MISC: Status: AC

## 2012-02-11 NOTE — Telephone Encounter (Signed)
Faxed. Emmajo Bennette Dawn  

## 2012-02-11 NOTE — Telephone Encounter (Signed)
Please fax Rx for incontinence supplies. Will you please also specify 1 year's worth. Thank you.

## 2012-02-26 ENCOUNTER — Ambulatory Visit: Payer: BC Managed Care – PPO | Admitting: Emergency Medicine

## 2012-03-05 ENCOUNTER — Other Ambulatory Visit (HOSPITAL_COMMUNITY): Payer: Self-pay | Admitting: Diagnostic Radiology

## 2012-03-05 ENCOUNTER — Ambulatory Visit (HOSPITAL_COMMUNITY)
Admission: RE | Admit: 2012-03-05 | Discharge: 2012-03-05 | Disposition: A | Discharge status: Home / Self Care | Payer: BC Managed Care – PPO | Source: Ambulatory Visit | Attending: Diagnostic Radiology | Admitting: Diagnostic Radiology

## 2012-03-05 DIAGNOSIS — G809 Cerebral palsy, unspecified: Secondary | ICD-10-CM

## 2012-03-05 DIAGNOSIS — Z434 Encounter for attention to other artificial openings of digestive tract: Secondary | ICD-10-CM | POA: Insufficient documentation

## 2012-03-05 MED ORDER — IOHEXOL 300 MG/ML  SOLN
50.0000 mL | Freq: Once | INTRAMUSCULAR | Status: AC | PRN
  Administered 2012-03-05: 20 mL

## 2012-03-05 NOTE — Procedures (Signed)
Successful replacement of feeding tube.  Tip in jejunum.  Feeding tube is ready to use.

## 2012-03-18 ENCOUNTER — Ambulatory Visit (INDEPENDENT_AMBULATORY_CARE_PROVIDER_SITE_OTHER): Payer: BC Managed Care – PPO | Admitting: Emergency Medicine

## 2012-03-18 ENCOUNTER — Encounter: Payer: Self-pay | Admitting: Emergency Medicine

## 2012-03-18 VITALS — BP 100/68 | HR 82 | Temp 96.8°F

## 2012-03-18 DIAGNOSIS — J9620 Acute and chronic respiratory failure, unspecified whether with hypoxia or hypercapnia: Secondary | ICD-10-CM

## 2012-03-18 NOTE — Assessment & Plan Note (Signed)
Currently stable - family does a great job with secretion management and chest PT. Reviewed signs to watch for that would cause [place] to change treatment

## 2012-03-18 NOTE — Patient Instructions (Addendum)
Please continue chest PT, suctioning and albuterol as you have been doing.  Call our office for any changes in [name]'s usual breathing or secretion pattern Follow with Dr Delton Coombes in 6 months or sooner if you have any problems

## 2012-03-18 NOTE — Progress Notes (Signed)
  Subjective:    Patient ID: [name], male    DOB: [birth year], 22 y.o.   MRN: [id number]  HPI 22 yo man, hx of cerebral palsy. He is dependent for his care, lives at home with mom and dad. Had been followed for secretion management, recurrent PNA's by Dr Donnie Coffin at [facility]. Never required intubation. Followed by Dr Sharene Skeans with Neurology. He gets suctioned occasionally but not frequently (few times a month). Not currently using pulmicort nebs at this time. He is on albuterol nebs prn. Uses chest vest bid. Condition Complicated by impaired secretion management    Post Hospital 08/19/11 --  Admitted 5/6-5/14 for CAP -Psuedomonas. Tx w/ 21 days of Cipro.  Pulmonary consult to discuss possible trach placement , family declined w/ DNI now in place. Placed on O2 at night at discharge.  Seen by [facility] 6/21 -cxr follow up 6/21 w/ clearance of PNA  Does Vest Twice daily    ROV 11/04/11 -- Hx cerebral palsy, recurrent PNA's. Severe CAP as above, now recovered. He is being suctioned rarely. His family had conversations about code status, trach, etc during hospitalization. They do not want him ventilated or to receive CPR, but they MIGHT accept trach if it were for maintenance like suctioning etc.   ROV 01/15/12 -- Hx cerebral palsy, recurrent PNA's. He underwent a long admission, discharged on 11/11, for severe PNA. He is now back home. He is wearing O2 at night, still shows signs of OSA but very difficult to treat. She is doing chest vest 4x a day.  He has not been to adult daycare since d/c to home.   ROV 03/19/12 -- Hx cerebral palsy, aspiration, recurrent PNA's.  His father is with him today. He has been managing secretions well. His breathing will sound a bit louder with some secretions when he needs to have BM. They are doing chest PT 4x a day. Often will cough up phlegm w chest PT. Seems to be back to baseline. Using O2 for sleep, not at other times.       Objective:   Physical Exam Filed  Vitals:   03/18/12 1421  BP: 100/68  Pulse: 82  Temp: 96.8 F (36 C)   Gen: Debilitated man in wheelchair, non-communicative  ENT: No lesions,  mouth clear,  oropharynx clear, poor dentition, no postnasal drip, L eye lateral gaze  Neck: No stridor, UA appears to be clear, snoring respirations when he dozes  Lungs: No use of accessory muscles, few basilar crackles  Cardiovascular: RRR, heart sounds normal, no murmur or gallops, no peripheral edema  Abdomen: PEG in place.   Musculoskeletal: No deformities, no cyanosis or clubbing  Neuro: alert, non focal  Skin: Warm, no lesions or rashes     Assessment & Plan:  Acute and chronic respiratory failure Currently stable - family does a great job with secretion management and chest PT. Reviewed signs to watch for that would cause [place] to change treatment

## 2012-04-26 ENCOUNTER — Other Ambulatory Visit: Payer: Self-pay | Admitting: *Deleted

## 2012-04-26 MED ORDER — OMEPRAZOLE 2 MG/ML ORAL SUSPENSION: 20.0000 mg | Freq: Every day | ORAL | Status: DC

## 2012-05-03 ENCOUNTER — Encounter (HOSPITAL_COMMUNITY): Payer: Self-pay

## 2012-05-03 ENCOUNTER — Telehealth: Payer: Self-pay | Admitting: Family Medicine

## 2012-05-03 DIAGNOSIS — Y833 Surgical operation with formation of external stoma as the cause of abnormal reaction of the patient, or of later complication, without mention of misadventure at the time of the procedure: Secondary | ICD-10-CM | POA: Diagnosis present

## 2012-05-03 DIAGNOSIS — Z79899 Other long term (current) drug therapy: Secondary | ICD-10-CM

## 2012-05-03 DIAGNOSIS — D696 Thrombocytopenia, unspecified: Secondary | ICD-10-CM | POA: Diagnosis not present

## 2012-05-03 DIAGNOSIS — J189 Pneumonia, unspecified organism: Secondary | ICD-10-CM | POA: Diagnosis present

## 2012-05-03 DIAGNOSIS — Z993 Dependence on wheelchair: Secondary | ICD-10-CM

## 2012-05-03 DIAGNOSIS — K9422 Gastrostomy infection: Principal | ICD-10-CM | POA: Diagnosis present

## 2012-05-03 DIAGNOSIS — D649 Anemia, unspecified: Secondary | ICD-10-CM | POA: Diagnosis not present

## 2012-05-03 DIAGNOSIS — G4733 Obstructive sleep apnea (adult) (pediatric): Secondary | ICD-10-CM | POA: Diagnosis present

## 2012-05-03 DIAGNOSIS — F79 Unspecified intellectual disabilities: Secondary | ICD-10-CM | POA: Diagnosis present

## 2012-05-03 DIAGNOSIS — G40909 Epilepsy, unspecified, not intractable, without status epilepticus: Secondary | ICD-10-CM | POA: Diagnosis present

## 2012-05-03 DIAGNOSIS — K219 Gastro-esophageal reflux disease without esophagitis: Secondary | ICD-10-CM | POA: Diagnosis present

## 2012-05-03 DIAGNOSIS — H9190 Unspecified hearing loss, unspecified ear: Secondary | ICD-10-CM | POA: Diagnosis present

## 2012-05-03 DIAGNOSIS — A419 Sepsis, unspecified organism: Secondary | ICD-10-CM | POA: Diagnosis present

## 2012-05-03 DIAGNOSIS — H47619 Cortical blindness, unspecified side of brain: Secondary | ICD-10-CM | POA: Diagnosis present

## 2012-05-03 DIAGNOSIS — J45909 Unspecified asthma, uncomplicated: Secondary | ICD-10-CM | POA: Diagnosis present

## 2012-05-03 DIAGNOSIS — G808 Other cerebral palsy: Secondary | ICD-10-CM | POA: Diagnosis present

## 2012-05-03 DIAGNOSIS — L02219 Cutaneous abscess of trunk, unspecified: Secondary | ICD-10-CM | POA: Diagnosis present

## 2012-05-03 NOTE — ED Notes (Signed)
Patient presents with questionable J-tube infection. Mom noticed tonight while bathing the patient an indurated area of redness, warmth and firmness around the tube. Also has had some intermittent scant bleeding to the gauze surrounding the tube for the past 3 days. No fevers, sweats or chills. Tube flushes well and patient is able to tolerate continuous tube feedings.

## 2012-05-03 NOTE — Telephone Encounter (Signed)
Received call from patients mother that area around his feeding tube is "hard and reddened"  Also having a small amount of bleeding around tube site.  Has been groaning more than usual today.  Denies drainage around site, fever, chills.  Discussed with mother that it would be a good idea to have him evaluated tonight to be sure there is not an infection around that area.  Other option if he is doing ok would be to wait to be seen by cross cover in the am.  Mother states she will take him to ED for evaluation tonight.

## 2012-05-04 ENCOUNTER — Inpatient Hospital Stay (HOSPITAL_COMMUNITY)
Admission: EM | Admit: 2012-05-04 | Discharge: 2012-05-07 | DRG: 552 | Disposition: A | Discharge status: Home / Self Care | Payer: BC Managed Care – PPO | Attending: Family Medicine | Admitting: Family Medicine

## 2012-05-04 ENCOUNTER — Encounter (HOSPITAL_COMMUNITY): Payer: Self-pay | Admitting: Radiology

## 2012-05-04 ENCOUNTER — Emergency Department (HOSPITAL_COMMUNITY): Payer: BC Managed Care – PPO

## 2012-05-04 DIAGNOSIS — R52 Pain, unspecified: Secondary | ICD-10-CM

## 2012-05-04 DIAGNOSIS — L0390 Cellulitis, unspecified: Secondary | ICD-10-CM

## 2012-05-04 DIAGNOSIS — R1310 Dysphagia, unspecified: Secondary | ICD-10-CM

## 2012-05-04 DIAGNOSIS — L02219 Cutaneous abscess of trunk, unspecified: Secondary | ICD-10-CM

## 2012-05-04 DIAGNOSIS — A419 Sepsis, unspecified organism: Secondary | ICD-10-CM

## 2012-05-04 DIAGNOSIS — Z993 Dependence on wheelchair: Secondary | ICD-10-CM

## 2012-05-04 DIAGNOSIS — K117 Disturbances of salivary secretion: Secondary | ICD-10-CM

## 2012-05-04 DIAGNOSIS — G809 Cerebral palsy, unspecified: Secondary | ICD-10-CM

## 2012-05-04 DIAGNOSIS — R569 Unspecified convulsions: Secondary | ICD-10-CM

## 2012-05-04 DIAGNOSIS — G4733 Obstructive sleep apnea (adult) (pediatric): Secondary | ICD-10-CM

## 2012-05-04 DIAGNOSIS — F79 Unspecified intellectual disabilities: Secondary | ICD-10-CM

## 2012-05-04 DIAGNOSIS — J189 Pneumonia, unspecified organism: Secondary | ICD-10-CM

## 2012-05-04 HISTORY — DX: Other artificial openings of gastrointestinal tract status: Z93.4

## 2012-05-04 LAB — GLUCOSE, CAPILLARY: Glucose-Capillary: 64 mg/dL — ABNORMAL LOW (ref 70–99)

## 2012-05-04 LAB — COMPREHENSIVE METABOLIC PANEL
Albumin: 2.9 g/dL — ABNORMAL LOW (ref 3.5–5.2)
BUN: 6 mg/dL (ref 6–23)
Calcium: 9.8 mg/dL (ref 8.4–10.5)
GFR calc Af Amer: >90 mL/min (ref >90)
Glucose, Bld: 91 mg/dL (ref 70–99)
Potassium: 3.6 mEq/L (ref 3.5–5.1)
Total Protein: 7.5 g/dL (ref 6.0–8.3)

## 2012-05-04 LAB — LACTIC ACID, PLASMA: Lactic Acid, Venous: 2 mmol/L (ref 0.5–2.2)

## 2012-05-04 LAB — CBC WITH DIFFERENTIAL/PLATELET
Basophils Relative: 0 % (ref 0–1)
Eosinophils Absolute: 0 10*3/uL (ref 0.0–0.7)
Eosinophils Relative: 0 % (ref 0–5)
Hemoglobin: 15.4 g/dL (ref 13.0–17.0)
Lymphs Abs: 1.9 10*3/uL (ref 0.7–4.0)
MCH: 31.8 pg (ref 26.0–34.0)
MCHC: 36 g/dL (ref 30.0–36.0)
MCV: 88.4 fL (ref 78.0–100.0)
Monocytes Relative: 16 % — ABNORMAL HIGH (ref 3–12)
Neutrophils Relative %: 69 % (ref 43–77)
Platelets: 170 10*3/uL (ref 150–400)
RBC: 4.84 MIL/uL (ref 4.22–5.81)

## 2012-05-04 LAB — URINALYSIS, ROUTINE W REFLEX MICROSCOPIC
Leukocytes, UA: NEGATIVE
Nitrite: NEGATIVE
Protein, ur: NEGATIVE mg/dL
Specific Gravity, Urine: 1.01 (ref 1.005–1.030)
Urobilinogen, UA: 2 mg/dL — ABNORMAL HIGH (ref 0.0–1.0)

## 2012-05-04 LAB — PROCALCITONIN: Procalcitonin: <0.1 ng/mL

## 2012-05-04 MED ORDER — DIVALPROEX SODIUM 125 MG PO CPSP: 250.0000 mg | ORAL_CAPSULE | Freq: Three times a day (TID) | ORAL | Status: DC

## 2012-05-04 MED ORDER — PIPERACILLIN-TAZOBACTAM 3.375 G IVPB
3.3750 g | Freq: Three times a day (TID) | INTRAVENOUS | Status: DC
  Administered 2012-05-04 – 2012-05-07 (×9): 3.375 g via INTRAVENOUS
  Filled 2012-05-04 (×13): qty 50

## 2012-05-04 MED ORDER — FREE WATER
45.0000 mL | Status: DC
  Administered 2012-05-04 – 2012-05-07 (×29): 45 mL

## 2012-05-04 MED ORDER — GABAPENTIN 300 MG PO CAPS
300.0000 mg | ORAL_CAPSULE | Freq: Three times a day (TID) | ORAL | Status: DC
  Filled 2012-05-04 (×3): qty 1

## 2012-05-04 MED ORDER — DIVALPROEX SODIUM 125 MG PO CPSP
375.0000 mg | ORAL_CAPSULE | Freq: Once | ORAL | Status: AC
  Administered 2012-05-04: 375 mg via ORAL
  Filled 2012-05-04: qty 3

## 2012-05-04 MED ORDER — DIVALPROEX SODIUM 125 MG PO CPSP
250.0000 mg | ORAL_CAPSULE | ORAL | Status: DC
  Administered 2012-05-04 – 2012-05-07 (×4): 250 mg via ORAL
  Filled 2012-05-04 (×4): qty 2

## 2012-05-04 MED ORDER — ACETAMINOPHEN 160 MG/5ML PO SOLN
650.0000 mg | Freq: Four times a day (QID) | ORAL | Status: DC | PRN
  Administered 2012-05-07: 650 mg
  Filled 2012-05-04 (×2): qty 20.3

## 2012-05-04 MED ORDER — NYSTATIN 100000 UNIT/GM EX CREA: TOPICAL_CREAM | Freq: Two times a day (BID) | CUTANEOUS | Status: DC | PRN

## 2012-05-04 MED ORDER — PANTOPRAZOLE SODIUM 40 MG PO PACK
40.0000 mg | PACK | Freq: Two times a day (BID) | ORAL | Status: DC
Start: 2012-05-04 — End: 2012-05-07
  Administered 2012-05-04 – 2012-05-07 (×5): 40 mg
  Filled 2012-05-04 (×9): qty 20

## 2012-05-04 MED ORDER — VANCOMYCIN HCL IN DEXTROSE 1-5 GM/200ML-% IV SOLN
1000.0000 mg | Freq: Once | INTRAVENOUS | Status: AC
  Administered 2012-05-04: 1000 mg via INTRAVENOUS
  Filled 2012-05-04: qty 200

## 2012-05-04 MED ORDER — VANCOMYCIN HCL 1000 MG IV SOLR
750.0000 mg | Freq: Three times a day (TID) | INTRAVENOUS | Status: DC
  Administered 2012-05-04 – 2012-05-07 (×9): 750 mg via INTRAVENOUS
  Filled 2012-05-04 (×11): qty 750

## 2012-05-04 MED ORDER — GABAPENTIN 300 MG PO CAPS
300.0000 mg | ORAL_CAPSULE | ORAL | Status: AC
  Administered 2012-05-04: 300 mg via ORAL
  Filled 2012-05-04: qty 1

## 2012-05-04 MED ORDER — BACLOFEN 20 MG PO TABS
20.0000 mg | ORAL_TABLET | Freq: Three times a day (TID) | ORAL | Status: DC
  Filled 2012-05-04 (×3): qty 1

## 2012-05-04 MED ORDER — SODIUM CHLORIDE 0.9 % IJ SOLN
3.0000 mL | Freq: Two times a day (BID) | INTRAMUSCULAR | Status: DC
  Administered 2012-05-04 – 2012-05-06 (×4): 3 mL via INTRAVENOUS

## 2012-05-04 MED ORDER — SODIUM CHLORIDE 0.9 % IV SOLN
1000.0000 mL | Freq: Once | INTRAVENOUS | Status: AC
  Administered 2012-05-04: 1000 mL via INTRAVENOUS

## 2012-05-04 MED ORDER — GERHARDT'S BUTT CREAM
1.0000 "application " | TOPICAL_CREAM | Freq: Two times a day (BID) | CUTANEOUS | Status: DC
  Administered 2012-05-04 – 2012-05-07 (×6): 1 via TOPICAL
  Filled 2012-05-04: qty 1

## 2012-05-04 MED ORDER — DIVALPROEX SODIUM 125 MG PO CPSP
375.0000 mg | ORAL_CAPSULE | ORAL | Status: DC
  Administered 2012-05-04 – 2012-05-07 (×5): 375 mg via ORAL
  Filled 2012-05-04 (×8): qty 3

## 2012-05-04 MED ORDER — PIPERACILLIN-TAZOBACTAM 3.375 G IVPB 30 MIN
3.3750 g | Freq: Once | INTRAVENOUS | Status: AC
  Administered 2012-05-04: 3.375 g via INTRAVENOUS
  Filled 2012-05-04: qty 50

## 2012-05-04 MED ORDER — GLYCOPYRROLATE 1 MG PO TABS
2.0000 mg | ORAL_TABLET | Freq: Two times a day (BID) | ORAL | Status: DC
  Administered 2012-05-04 – 2012-05-07 (×6): 2 mg
  Filled 2012-05-04 (×8): qty 2

## 2012-05-04 MED ORDER — OSMOLITE 1.2 CAL PO LIQD
1000.0000 mL | ORAL | Status: DC
  Administered 2012-05-04: 1000 mL
  Filled 2012-05-04 (×4): qty 1000

## 2012-05-04 MED ORDER — KANGAROO JOEY ENTERAL PUMP MISC: 30.0000 [IU] | Freq: Every day | Status: DC

## 2012-05-04 MED ORDER — GABAPENTIN 300 MG PO CAPS
300.0000 mg | ORAL_CAPSULE | ORAL | Status: DC
  Administered 2012-05-04 – 2012-05-07 (×8): 300 mg
  Filled 2012-05-04 (×12): qty 1

## 2012-05-04 MED ORDER — BACLOFEN 20 MG PO TABS
20.0000 mg | ORAL_TABLET | Freq: Three times a day (TID) | ORAL | Status: DC
  Administered 2012-05-04 – 2012-05-07 (×9): 20 mg
  Filled 2012-05-04 (×12): qty 1

## 2012-05-04 MED ORDER — SODIUM CHLORIDE 0.9 % IV SOLN
1000.0000 mL | INTRAVENOUS | Status: DC
  Administered 2012-05-04 – 2012-05-07 (×5): 1000 mL via INTRAVENOUS

## 2012-05-04 MED ORDER — DIPHENHYDRAMINE HCL 50 MG/ML IJ SOLN
25.0000 mg | Freq: Once | INTRAMUSCULAR | Status: AC
  Administered 2012-05-04: 25 mg via INTRAVENOUS
  Filled 2012-05-04: qty 1

## 2012-05-04 MED ORDER — ALBUTEROL SULFATE HFA 108 (90 BASE) MCG/ACT IN AERS
1.0000 | INHALATION_SPRAY | RESPIRATORY_TRACT | Status: DC
  Administered 2012-05-04 (×3): 2 via RESPIRATORY_TRACT
  Filled 2012-05-04: qty 6.7

## 2012-05-04 MED ORDER — ALBUTEROL SULFATE HFA 108 (90 BASE) MCG/ACT IN AERS
2.0000 | INHALATION_SPRAY | Freq: Three times a day (TID) | RESPIRATORY_TRACT | Status: DC
  Administered 2012-05-04: 2 via RESPIRATORY_TRACT
  Filled 2012-05-04: qty 6.7

## 2012-05-04 MED ORDER — OMEPRAZOLE 2 MG/ML ORAL SUSPENSION: 20.0000 mg | Freq: Every day | ORAL | Status: DC

## 2012-05-04 MED ORDER — GLYCOPYRROLATE 1 MG PO TABS
2.0000 mg | ORAL_TABLET | Freq: Two times a day (BID) | ORAL | Status: DC
  Filled 2012-05-04 (×2): qty 2

## 2012-05-04 MED ORDER — ACETAMINOPHEN 160 MG/5ML PO SOLN: 15.0000 mg/kg | Freq: Four times a day (QID) | ORAL | Status: DC | PRN

## 2012-05-04 MED ORDER — ACETAMINOPHEN 160 MG/5ML PO SOLN
15.0000 mg/kg | Freq: Once | ORAL | Status: AC
  Administered 2012-05-04: 659.2 mg
  Filled 2012-05-04: qty 20.3

## 2012-05-04 MED ORDER — HEPARIN SODIUM (PORCINE) 5000 UNIT/ML IJ SOLN
5000.0000 [IU] | Freq: Three times a day (TID) | INTRAMUSCULAR | Status: DC
  Administered 2012-05-04 – 2012-05-05 (×4): 5000 [IU] via SUBCUTANEOUS
  Filled 2012-05-04 (×6): qty 1

## 2012-05-04 NOTE — Progress Notes (Signed)
ANTIBIOTIC CONSULT NOTE - INITIAL  Pharmacy Consult for Vancomycin and Zosyn  Indication: J tube infection  Allergies  Allergen Reactions  . Sulfamethoxazole W-Trimethoprim Rash    "don't know how bad" /father 12/17/2011    Patient Measurements: Height: 4' 9.87" (147 cm) Weight: 97 lb (44 kg) (12/2011) IBW/kg (Calculated) : 45.11  Vital Signs: Temp: 98.9 F (37.2 C) (03/11 0322) Temp src: Rectal (03/11 0322) BP: 108/61 mmHg (03/11 0315) Pulse Rate: 90 (03/11 0315)  Labs:  Recent Labs  05/04/12 0051  WBC 13.1*  HGB 15.4  PLT 170  CREATININE 0.28*   Estimated Creatinine Clearance: 90.1 ml/min (by C-G formula based on Cr of 0.28). No results found for this basename: VANCOTROUGH, VANCOPEAK, VANCORANDOM, GENTTROUGH, GENTPEAK, GENTRANDOM, TOBRATROUGH, TOBRAPEAK, TOBRARND, AMIKACINPEAK, AMIKACINTROU, AMIKACIN,  in the last 72 hours   Microbiology: No results found for this or any previous visit (from the past 720 hour(s)).  Medical History: Past Medical History  Diagnosis Date  . Allergy   . Asthma   . Cerebral palsy, quadriplegic   . Congenital CMV infection   . Neuromuscular disorder     cp  . H/O hiatal hernia   . Pneumonia     "he's had it several times; maybe now" (12/17/2011)  . Community acquired pneumonia 06/30/2011    Psuedomonas PNA 06/2011 >tx 21 d of Cipro  CXR 08/15/11 >clearance of PNA    . GERD (gastroesophageal reflux disease)   . Seizures     "has had gran mals before" (12/17/2011)  . Cytomegalovirus   . On home oxygen therapy     "at night" (12/17/2011"  . Jejunostomy tube present     Medications:  Baclofen  Depakote  Neurontin  Glycopyrrolate  Nystatin  Prilosec  Albuterol  Assessment: 22 yo male with redness, possible infection around J-tube site, for empiric antibiotics.  Vancomycin 1 g IV given in ED at 0400  Goal of Therapy:  Vancomycin trough level 10-15 mcg/ml  Plan:  Vancomycin 750 mg IV q8h Zosyn 3.375 g IV q8h  Eddie Candle 05/04/2012,4:36 AM

## 2012-05-04 NOTE — H&P (Signed)
FMTS Attending Admission Note: Denny Levy MD [phone] pager office [phone] I  have seen and examined this patient, reviewed their chart. I have discussed this patient with the resident. I agree with the resident's findings, assessment and care plan. Mom was not at bedside during ny exam this morning. I know [place] fairly well from prior hospitalizations. I reviewed CXR which is difficult for me to really say whether or not I see retrocardiac opacity, but given clinical picture and his hx (multiple episodes resp infection) I think treatment for pneumonia is warranted. His abdominal J tube site looks pretty good--only some mild erythema, no induration.He is DNR but per report would consider tracheosomy if he needed it for suction---taha is not part of typical CODE so we have clarified his DNR status.

## 2012-05-04 NOTE — Progress Notes (Signed)
Patient ID: [name], male   DOB: 12-Sep-1990, 22 y.o.   MRN: [id number] Pt with chronic indwelling gastrojejunostomy tube, last exchanged on 03/05/1012. Now with superficial cellulitis at insertion site. On exam, tube is intact with minimal drainage on overlying gauze dressing. There is some streaking erythema around insertion site as well as induration. CT abd done today revealed no underlying soft tissue abscess or focal fluid collection at tube site. Images were reviewed by Dr. Archer Asa. He recommends continued IV antibiotic therapy and close monitoring of site. If  increased drainage at insertion site occurs we can then set up for exchange. May place 1 layer of drain sponge/gauze under disc to keep from irritating skin.

## 2012-05-04 NOTE — Consult Note (Signed)
WOC ostomy consult  Stoma type/location: gastrostomy site, with tube in place LLQ Peristomal assessment: some mild redness, firmness around the site.No concern for candida Treatment options for stomal/peristomal skin: added Calmoseptine for peristomal skin irritation  No skin breakdown noted. Minimal drainage at the site on the drain sponge, appears serosanginous.  Re consult if needed, will not follow at this time. Thanks  [employer], CWOCN [phone])

## 2012-05-04 NOTE — H&P (Signed)
Family Medicine Teaching [facility] Admission History and Physical Service Pager: [phone]  Patient name: [name] Medical record number: [id number] Date of birth: Mar 06, 1990 Age: 22 y.o. Gender: male  Primary Care Provider: [facility], Marylene Land, MD  Chief Complaint: J tube induration/erythema  Assessment and Plan: [name] is a 22 y.o. year old male presenting with sepsis secondary to J tube infection.  1. Sepsis secondary to J tube infection - Admit to SDU 1. F/U BCx, UCx, AM CBC 2. Vanc/Zosyn per pharmacy  3. Wound Care consult  4. Will hold feeds at this time.  Consider restarting when infection starts to improve  5. Tylenol per tube, continue nystatin cream  6. Vitals per unit 2. Possible retrocardiac pneumonia with Hx of CPAP  1. Chest PT QID 2. Albuterol every four hours, consider switching to PRN  3. Continuous pulse ox, O2 if <92%, does not tolerate CPAP (followed by Dr. Delton Coombes in outpatient setting) 3. Hx of reflux - Continue Prilosec per tube  1. Keep bed elevated > 30 degrees 4. Hx of MRCP 1. Continue Gabapentin, Robinul, Baclofen and Depakote  5. FEN/GI: NS 125 cc/hr, Omeprazole solution 6. Prophylaxis: Heparin SQ 7. Disposition: SDU pending clinical improvement  8. Code Status: Limited Code ( No intubation/CPR but possible trach tube for suction)  History of Present Illness: [name] is a 22 y.o. year old male presenting with J tube infection.  History provided by mom.  She states he was in his normal state of health up until tonight when she went to give him a bath and noticed that the area around his J tube was red, swollen, and indurated.  She called the person on call who told her to come in to the hospital for further evaluation.    In the ED, pt was found to be febrile to 101.7 and tachycardic to 127.  He had BCx, UCx drawn, a CXR showing retrocardiac opacity, a CT abdomen showing some cellulitis around the area but no abscess, procalcitonin  <1.0, Lactic Acid 2.0, CBC with WBC of 13.1.  He was subsuquently started on Vanc/Zosyn and received one dose in the ED and 1 L NS bolus.   Also, mom states that she did notice that he was feeling uncomfortable on his side yesterday, with previous instances like this indicating pneumonia.  He did have a little bit of blood at the time of dressing around his J tube yesterday as well.  Otherwise, no fever, no increased secretions, no foul smelling urine, no lower extremity edema.   Patient Active Problem List  Diagnosis  . MENTAL RETARDATION  . SLEEP APNEA, OBSTRUCTIVE  . CEREBRAL PALSY  . CORTICAL BLINDNESS  . HEARING IMPAIRMENT  . CONGENITAL CYTOMEGALOVIRUS INFECTION  . SEIZURE DISORDER  . Wheelchair dependence  . Dysphagia  . Acute and chronic respiratory failure  . Pain, generalized  . Increased oropharyngeal secretions  . Hospice program care  . Sepsis   Past Medical History: Past Medical History  Diagnosis Date  . Allergy   . Asthma   . Cerebral palsy, quadriplegic   . Congenital CMV infection   . Neuromuscular disorder     cp  . H/O hiatal hernia   . Pneumonia     "he's had it several times; maybe now" (12/17/2011)  . Community acquired pneumonia 06/30/2011    Psuedomonas PNA 06/2011 >tx 21 d of Cipro  CXR 08/15/11 >clearance of PNA    . GERD (gastroesophageal reflux disease)   .  Seizures     "has had gran mals before" (12/17/2011)  . Cytomegalovirus   . On home oxygen therapy     "at night" (12/17/2011"  . Jejunostomy tube present    Past Surgical History: Past Surgical History  Procedure Laterality Date  . Nissen fundoplication    . Rod  ~ 2003 "or before"    "in his back" (12/17/2011   Social History: History  Substance Use Topics  . Smoking status: Never Smoker   . Smokeless tobacco: Never Used  . Alcohol Use: No   For any additional social history documentation, please refer to relevant sections of EMR.  Family History: Family History  Problem  Relation Age of Onset  . Allergies Mother   . Heart disease Maternal Uncle   . Colon cancer Paternal Grandmother    Allergies: Allergies  Allergen Reactions  . Sulfamethoxazole W-Trimethoprim Rash    "don't know how bad" /father 12/17/2011   No current facility-administered medications on file prior to encounter.   Current Outpatient Prescriptions on File Prior to Encounter  Medication Sig Dispense Refill  . divalproex (DEPAKOTE SPRINKLE) 125 MG capsule Take 250-375 mg by mouth 3 (three) times daily. 375 mg in the morning, 250 mg midday, and 375 mg at bedtime      . Feeding Tubes - Pump (KANGAROO JOEY ENTERAL PUMP) MISC 30 Units by Does not apply route daily. Please provide bags for Kangroo Joey enteral feeding device. Should be changed daily.  30 each  12  . gabapentin (NEURONTIN) 300 MG capsule 1 tab per tube three times a day       . Hydrocortisone (GERHARDT'S BUTT CREAM) CREA Apply 1 application topically 2 (two) times daily. Please provide remained of tube used in hospital  1 each  2  . Incontinence Supplies MISC May have incontinence supplies as determined by home health agency.  30 each  0  . nystatin cream (MYCOSTATIN) Apply topically 2 (two) times daily as needed (Redness).  30 g  1  . omeprazole (PRILOSEC) 2 mg/mL SUSP Take 10 mLs (20 mg total) by mouth daily at 12 noon.  150 mL  3  . PROVENTIL HFA 108 (90 BASE) MCG/ACT inhaler USE 2 PUFFS EVERY 4 HOURS AS NEEDED FOR WHEEZE  1 Inhaler  0   Review Of Systems: Per HPI with the following additions: None Otherwise 12 point review of systems was performed and was unremarkable.  Physical Exam: BP 108/61  Pulse 90  Temp(Src) 98.9 F (37.2 C) (Rectal)  Resp 16  SpO2 95% Exam: General: NAD, sleeping in bed HEENT: [place]/AT, MMM Cardiovascular: Tachycardic, Regular rhythm, no murmur appreciated Respiratory: Good air movement, possible crackles left lower lobe, no wheezes noted B/L  Abdomen: + J tube LLQ with erythema and induration  2 x 2 cm, no pus drainage Extremities: no edema B/L  Skin: Dry, intact except for abdomen   Labs and Imaging: CBC BMET   Recent Labs Lab 05/04/12 0051  WBC 13.1*  HGB 15.4  HCT 42.8  PLT 170    Recent Labs Lab 05/04/12 0051  NA 131*  K 3.6  CL 91*  CO2 31  BUN 6  CREATININE 0.28*  GLUCOSE 91  CALCIUM 9.8    Bryan R. Paulina Fusi, DO of [facility] 05/04/2012, 4:42 AM  PGY-3  Patient seen and examined with Dr. Paulina Fusi.  I agree with his exam findings, assessment and plan as outlined above with any additions in red.  Everrett Coombe, DO

## 2012-05-04 NOTE — ED Notes (Signed)
Pt returned from CT °

## 2012-05-04 NOTE — ED Provider Notes (Signed)
History     CSN: [id number]  Arrival date & time 05/03/12  2040   First MD Initiated Contact with Patient 05/04/12 0014      No chief complaint on file.   (Consider location/radiation/quality/duration/timing/severity/associated sxs/prior treatment) The history is provided by a parent.  [name] is a 22 y.o. male history of congenital CMV infection with cerebral palsy now is wheelchair-bound and has J-tube for feeding, recurrent infections here presenting with possible infection around the J-tube. Mom noticed that there seemed to be some erythema around the J-tube today and that area is very firm. He seemed to develop some chills at home. He is tolerating continuous feeds and is not vomiting. Not coughing or having diarrhea. The J-tube is changed a month ago. He have pneumonia last year and was in the hospital for several weeks.  Level V caveat- patient nonverbal    Past Medical History  Diagnosis Date  . Allergy   . Asthma   . Cerebral palsy, quadriplegic   . Congenital CMV infection   . Neuromuscular disorder     cp  . H/O hiatal hernia   . Pneumonia     "he's had it several times; maybe now" (12/17/2011)  . Community acquired pneumonia 06/30/2011    Psuedomonas PNA 06/2011 >tx 21 d of Cipro  CXR 08/15/11 >clearance of PNA    . GERD (gastroesophageal reflux disease)   . Seizures     "has had gran mals before" (12/17/2011)  . Cytomegalovirus   . On home oxygen therapy     "at night" (12/17/2011"  . Jejunostomy tube present     Past Surgical History  Procedure Laterality Date  . Nissen fundoplication    . Rod  ~ 2003 "or before"    "in his back" (12/17/2011    Family History  Problem Relation Age of Onset  . Allergies Mother   . Heart disease Maternal Uncle   . Colon cancer Paternal Grandmother     History  Substance Use Topics  . Smoking status: Never Smoker   . Smokeless tobacco: Never Used  . Alcohol Use: No      Review of Systems  Constitutional:  Positive for fever and chills.  Gastrointestinal: Positive for abdominal pain.  All other systems reviewed and are negative.    Allergies  Sulfamethoxazole w-trimethoprim  Home Medications   Current Outpatient Rx  Name  Route  Sig  Dispense  Refill  . baclofen (LIORESAL) 10 MG tablet   Oral   Take 20 mg by mouth 3 (three) times daily.         . divalproex (DEPAKOTE SPRINKLE) 125 MG capsule   Oral   Take 250-375 mg by mouth 3 (three) times daily. 375 mg in the morning, 250 mg midday, and 375 mg at bedtime         . Feeding Tubes - Pump (KANGAROO JOEY ENTERAL PUMP) MISC   Does not apply   30 Units by Does not apply route daily. Please provide bags for Kangroo Joey enteral feeding device. Should be changed daily.   30 each   12   . gabapentin (NEURONTIN) 300 MG capsule      1 tab per tube three times a day          . glycopyrrolate (ROBINUL) 2 MG tablet   Oral   Take 2 mg by mouth 2 (two) times daily.         . Hydrocortisone (GERHARDT'S BUTT CREAM) CREA  Topical   Apply 1 application topically 2 (two) times daily. Please provide remained of tube used in hospital   1 each   2   . Incontinence Supplies MISC      May have incontinence supplies as determined by home health agency.   30 each   0   . nystatin cream (MYCOSTATIN)   Topical   Apply topically 2 (two) times daily as needed (Redness).   30 g   1   . omeprazole (PRILOSEC) 2 mg/mL SUSP   Oral   Take 10 mLs (20 mg total) by mouth daily at 12 noon.   150 mL   3   . PROVENTIL HFA 108 (90 BASE) MCG/ACT inhaler      USE 2 PUFFS EVERY 4 HOURS AS NEEDED FOR WHEEZE   1 Inhaler   0     BP 108/61  Pulse 90  Temp(Src) 98.9 F (37.2 C) (Rectal)  Resp 16  SpO2 95%  Physical Exam  Nursing note and vitals reviewed. Constitutional:  Contracted, nonverbal, shaking chills   HENT:  Head: Normocephalic.  MM dry   Eyes: Conjunctivae are normal. Pupils are equal, round, and reactive to light.   Neck: Normal range of motion. Neck supple.  Cardiovascular: Regular rhythm and normal heart sounds.   Tachy   Pulmonary/Chest: Effort normal and breath sounds normal. No respiratory distress. He has no wheezes. He has no rales.  Abdominal: Soft. Bowel sounds are normal.  J tube in place with surrounding erythema and fluctuance and firm area.   Musculoskeletal:  Contracted throughout   Neurological: He is alert.  Skin: Skin is warm and dry.  Psychiatric:  Unable     ED Course  Procedures (including critical care time)  Labs Reviewed  CBC WITH DIFFERENTIAL - Abnormal; Notable for the following:    WBC 13.1 (*)    Neutro Abs 9.1 (*)    Monocytes Relative 16 (*)    Monocytes Absolute 2.1 (*)    All other components within normal limits  COMPREHENSIVE METABOLIC PANEL - Abnormal; Notable for the following:    Sodium 131 (*)    Chloride 91 (*)    Creatinine, Ser 0.28 (*)    Albumin 2.9 (*)    All other components within normal limits  URINALYSIS, ROUTINE W REFLEX MICROSCOPIC - Abnormal; Notable for the following:    Urobilinogen, UA 2.0 (*)    All other components within normal limits  CULTURE, BLOOD (ROUTINE X 2)  CULTURE, BLOOD (ROUTINE X 2)  URINE CULTURE  LACTIC ACID, PLASMA  PROCALCITONIN  VALPROIC ACID LEVEL   Ct Abdomen Pelvis Wo Contrast  05/04/2012  *RADIOLOGY REPORT*  Clinical Data: GJ tube infection  CT ABDOMEN AND PELVIS WITHOUT CONTRAST  Technique:  Multidetector CT imaging of the abdomen and pelvis was performed following the standard protocol without intravenous contrast.  Comparison: None.  Findings: Retrocardiac opacity.  Heart size within normal limits.  Organ abnormality/ pathology detection is limited in the absence of intravenous contrast.  Images are further degraded by streak artifact from the patient's vertebral rods and extremity positioning.  Within these limitations, unremarkable liver, spleen, pancreas, biliary system, adrenal glands, kidneys.  No  hydronephrosis or hydroureter.  No bowel obstruction.  No CT evidence for colitis. Gastrojejunostomy tube in place.  There is subcutaneous fat stranding along the left lateral abdominal wall at the level of the gastrostomy tube.  No loculated/drainable fluid collection is visualized.  No free intraperitoneal air or fluid.  No appreciable lymphadenopathy.  Partially decompressed bladder.  Normal caliber aorta and branch vessels.  Paraspinal muscle atrophy.  Bony exostosis arising from the proximal left femoral shaft.  Irregularity of the right femoral head.  No acute osseous finding.  IMPRESSION: The gastrojejunostomy tube remains in place.  There is subcutaneous fat stranding along the lateral abdominal wall at this level, which may reflect cellulitis.  No extravasation of injected contrast or associated loculated fluid collection identified, within limitations of a nonenhanced examination.  Retrocardiac opacity is nonspecific; atelectasis, aspiration, or infiltrate.   Bony exostosis arising from the proximal left femoral shaft. Correlate clinically for stability.   Original Report Authenticated By: Jearld Lesch, M.D.    Dg Chest 1 View  05/04/2012  *RADIOLOGY REPORT*  Clinical Data: Fever  CHEST - 1 VIEW  Comparison: 12/23/2011  Findings: Vertebral rods are unchanged.  Heart size upper normal. Retrocardiac opacity.  No pleural effusion or pneumothorax.  No acute osseous finding.  IMPRESSION: Mild retrocardiac opacity; aspiration, atelectasis, or infiltrate.   Original Report Authenticated By: Jearld Lesch, M.D.      1. Community acquired pneumonia   2. Cellulitis and abscess of trunk       MDM  [name] is a 22 y.o. male here with fever, tachycardia. Concerned about sepsis. Will do labs, cultures, UA, CXR, and CT ab/pel (r/o abscess). Will likely need admission.   4 AM CXR showed pneumonia. WBC elevated 13. CT ab/pel showed cellulitis no abscess. UA nl. Cultures sent, lactate nl.  Broad spectrum abx coverage with vanc/zosyn for pneumonia and cellulitis. I called family medicine, who will admit the patient.        Richardean Canal, MD 05/04/12 [phone]

## 2012-05-04 NOTE — ED Notes (Addendum)
Pt's mother witnessed him having focal seizure activity lasting around 30 seconds.  She states that this is normal for the pt but wants to ensure that his home seizure medications will be given.  MD notified.

## 2012-05-04 NOTE — Progress Notes (Signed)
Family Medicine Teaching Service Daily Progress Note Service Page: [phone]  Subjective: no additional history. Patient was alone in the room and non-verbal.  Objective: Temp:  [96.8 F (36 C)-101.7 F (38.7 C)] 97.4 F (36.3 C) (03/11 0814) Pulse Rate:  [73-127] 73 (03/11 0814) Resp:  [16] 16 (03/11 0315) BP: (94-127)/(46-109) 94/62 mmHg (03/11 0814) SpO2:  [95 %-99 %] 98 % (03/11 0814) Weight:  [74 lb 11.8 oz (33.9 kg)-97 lb (44 kg)] 74 lb 11.8 oz (33.9 kg) (03/11 0710) Exam: General: NAD< resting comfortably in bed Cardiovascular: rrr, no mrg Respiratory: CTAB anteriorly Abdomen: s, NT, ND, J tube in place with surrounding erythema  CBC BMET   Recent Labs Lab 05/04/12 0051  WBC 13.1*  HGB 15.4  HCT 42.8  PLT 170    Recent Labs Lab 05/04/12 0051  NA 131*  K 3.6  CL 91*  CO2 31  BUN 6  CREATININE 0.28*  GLUCOSE 91  CALCIUM 9.8     Lactic acid 2.0 Procalcitonin <0.10 VPA level 93.1  Imaging/Diagnostic Tests: Ct Abdomen Pelvis Wo Contrast  05/04/2012  IMPRESSION: The gastrojejunostomy tube remains in place.  There is subcutaneous fat stranding along the lateral abdominal wall at this level, which may reflect cellulitis.  No extravasation of injected contrast or associated loculated fluid collection identified, within limitations of a nonenhanced examination.  Retrocardiac opacity is nonspecific; atelectasis, aspiration, or infiltrate.   Bony exostosis arising from the proximal left femoral shaft. Correlate clinically for stability.   Original Report Authenticated By: Jearld Lesch, M.D.    Dg Chest 1 View  05/04/2012   IMPRESSION: Mild retrocardiac opacity; aspiration, atelectasis, or infiltrate.   Original Report Authenticated By: Jearld Lesch, M.D.    Assessment/Plan: [name] is a 22 y.o. year old male presenting with sepsis secondary to J tube infection, likely cellulitis.   1. Sepsis (fever to 101.7 and tachy to 127) secondary to J tube  infection/cellulitis -F/U BCx, UCx, AM CBC -Vanc/Zosyn per pharmacy  -Wound Care consult  -Will hold feeds at this time. Consider restarting when infection starts to improve  -Tylenol per tube, continue nystatin cream  -Vitals per unit -will need to have a discussion about replacing tube-?GI vs surgery c/s  2. Possible retrocardiac pneumonia with Hx of CPAP  -Chest PT QID -Albuterol every four hours, consider switching to PRN  -Continuous pulse ox, O2 if <92%, does not tolerate CPAP (followed by Dr. Delton Coombes in outpatient setting)  3. Hx of reflux - Continue Prilosec per tube  -Keep bed elevated > 30 degrees  4. Hx of MRCP  -Continue Gabapentin, Robinul, Baclofen and Depakote   FEN/GI: NS 125 cc/hr, Omeprazole solution Prophylaxis: Heparin SQ Disposition: SDU pending clinical improvement  Code Status: Limited Code (No intubation/CPR but possible trach tube for suction)  Marikay Alar, MD 05/04/2012, 8:37 AM

## 2012-05-04 NOTE — Progress Notes (Signed)
INITIAL NUTRITION ASSESSMENT  DOCUMENTATION CODES Per approved criteria  -Underweight    INTERVENTION: 1. Given pt's recent weight loss, recommend place NG tube to continue home TF until pt is able to use J tube.  2. Recommend increase home regimen to 45 ml/hr to promote weight gain. Osmolite 1.2 @ 45 ml/hr would provide 1296 kcal, 60 gm protein, and 886 ml free water. (continue pt's home water flushes, 60 ml q 2 hr).  3. RD will continue to follow    NUTRITION DIAGNOSIS: Inadequate enteral nutrition infusion related to possible increased needs as evidenced by weight loss with stable EN regimen.   Goal: Initiation of EN to meet >/=90% estimated nutrition needs  Monitor:  PO intake, weight trends, labs, I/O's, TF initiation   Reason for Assessment: Home TF  22 y.o. male  Admitting Dx: Sepsis  ASSESSMENT: Pt with hx of CP, has J tube for home enteral nutrition. Was admitted with possible infection at J tube site, and PNA. Currently TF is on hold. May require replacement of tube per notes.   Per last admission pt's home regimen is Osmolite 1.2 at 37 ml/hr with 60 ml (2 oz) flushes q 2 hours. This provides: 1065 kcal, 50 grams protein, 728 ml free water, 720 ml additional water daily (total of 1448 ml water daily.) Home med list does not list TF. Family in room confirms no changes to TF, no recent issues.   Weight hx shows 23 lbs weight loss in the past 4 months (24% body weight, severe weight loss). Confirmed weight in bed. Family did not notice any weight loss, was shocked with pt's weight when RD weighed pt in bed. Question malabsorption vs increased nutrition needs leading to weight loss. Noted: pt with fever on admission.   Height: Ht Readings from Last 1 Encounters:  05/04/12 5\' 2"  (1.575 m)   Ht Readings from Last 3 Encounters:  05/04/12 5\' 2"  (1.575 m)  12/17/11 4' 9.87" (1.47 m)  06/30/11 4\' 10"  (1.473 m)     Weight: Wt Readings from Last 1 Encounters:  05/04/12 74  lb 11.8 oz (33.9 kg)    Ideal Body Weight: 39.6 kg (adj for quadriplegia)  % Ideal Body Weight: 86%  Wt Readings from Last 10 Encounters:  05/04/12 74 lb 11.8 oz (33.9 kg)  01/05/12 97 lb (44 kg)  11/04/11 95 lb (43.092 kg)  07/08/11 93 lb 11.1 oz (42.5 kg)    Usual Body Weight: 95 lbs   % Usual Body Weight: 78%  BMI:  15.7 kg/(m^2) with height of 4'10",  Underweight   Estimated Nutritional Needs: Kcal: 1000-1200  Protein: 34-40 gm  Fluid: >/= 1.2 L   Skin: no breakdown noted at this admission.   Diet Order:  none  (RD will clarify-NPO status)  EDUCATION NEEDS: -No education needs identified at this time   Intake/Output Summary (Last 24 hours) at 05/04/12 1147 Last data filed at 05/04/12 0829  Gross per 24 hour  Intake      0 ml  Output    200 ml  Net   -200 ml    Last BM: PTA    Labs:   Recent Labs Lab 05/04/12 0051  NA 131*  K 3.6  CL 91*  CO2 31  BUN 6  CREATININE 0.28*  CALCIUM 9.8  GLUCOSE 91    CBG (last 3)   Recent Labs  05/04/12 1105  GLUCAP 64*    Scheduled Meds: . albuterol  1-2 puff Inhalation Q4H  .  baclofen  20 mg Per Tube TID  . divalproex  250 mg Oral Q24H  . divalproex  375 mg Oral Custom  . gabapentin  300 mg Per Tube Custom  . Gerhardt's butt cream  1 application Topical BID  . glycopyrrolate  2 mg Per Tube BID  . heparin  5,000 Units Subcutaneous Q8H  . pantoprazole sodium  40 mg Per Tube BID  . piperacillin-tazobactam (ZOSYN)  IV  3.375 g Intravenous Q8H  . sodium chloride  3 mL Intravenous Q12H  . vancomycin  750 mg Intravenous Q8H    Continuous Infusions: . sodium chloride Stopped (05/04/12 1610)    Past Medical History  Diagnosis Date  . Allergy   . Asthma   . Cerebral palsy, quadriplegic   . Congenital CMV infection   . Neuromuscular disorder     cp  . H/O hiatal hernia   . Pneumonia     "he's had it several times; maybe now" (12/17/2011)  . Community acquired pneumonia 06/30/2011    Psuedomonas  PNA 06/2011 >tx 21 d of Cipro  CXR 08/15/11 >clearance of PNA    . GERD (gastroesophageal reflux disease)   . Seizures     "has had gran mals before" (12/17/2011)  . Cytomegalovirus   . On home oxygen therapy     "at night" (12/17/2011"  . Jejunostomy tube present     Past Surgical History  Procedure Laterality Date  . Nissen fundoplication    . Rod  ~ 2003 "or before"    "in his back" (12/17/2011    Clarene Duke RD, LDN Pager [phone] After Hours pager [phone]

## 2012-05-04 NOTE — ED Notes (Addendum)
Pt's face turned red from Vancomycin.  MD notified. Benadryl ordered and rate on Vancomycin decreased to 50 ml/hr.

## 2012-05-04 NOTE — ED Notes (Signed)
MD at bedside. 

## 2012-05-05 DIAGNOSIS — G809 Cerebral palsy, unspecified: Secondary | ICD-10-CM

## 2012-05-05 LAB — BASIC METABOLIC PANEL
BUN: 4 mg/dL — ABNORMAL LOW (ref 6–23)
Calcium: 9.1 mg/dL (ref 8.4–10.5)
Creatinine, Ser: 0.21 mg/dL — ABNORMAL LOW (ref 0.50–1.35)
GFR calc Af Amer: >90 mL/min (ref >90)
GFR calc non Af Amer: >90 mL/min (ref >90)

## 2012-05-05 LAB — URINE CULTURE: Culture: NO GROWTH

## 2012-05-05 LAB — CBC
HCT: 37.6 % — ABNORMAL LOW (ref 39.0–52.0)
MCHC: 34.3 g/dL (ref 30.0–36.0)
MCV: 91.5 fL (ref 78.0–100.0)
Platelets: 195 10*3/uL (ref 150–400)
RDW: 13.7 % (ref 11.5–15.5)
RDW: 13.7 % (ref 11.5–15.5)
WBC: 8.7 10*3/uL (ref 4.0–10.5)

## 2012-05-05 LAB — VANCOMYCIN, TROUGH: Vancomycin Tr: 9.5 ug/mL — ABNORMAL LOW (ref 10.0–20.0)

## 2012-05-05 MED ORDER — POTASSIUM CHLORIDE 10 MEQ/100ML IV SOLN
10.0000 meq | INTRAVENOUS | Status: AC
  Administered 2012-05-05 (×4): 10 meq via INTRAVENOUS
  Filled 2012-05-05 (×3): qty 100

## 2012-05-05 MED ORDER — ALBUTEROL (5 MG/ML) CONTINUOUS INHALATION SOLN
2.5000 mg/h | INHALATION_SOLUTION | RESPIRATORY_TRACT | Status: DC | PRN
  Filled 2012-05-05: qty 20

## 2012-05-05 MED ORDER — ADULT MULTIVITAMIN LIQUID CH
5.0000 mL | Freq: Every day | ORAL | Status: DC
  Administered 2012-05-05 – 2012-05-07 (×3): 5 mL
  Filled 2012-05-05 (×5): qty 5

## 2012-05-05 MED ORDER — JEVITY 1.2 CAL PO LIQD
1000.0000 mL | ORAL | Status: DC
  Administered 2012-05-05 – 2012-05-06 (×2): 1000 mL
  Filled 2012-05-05 (×3): qty 1000

## 2012-05-05 MED ORDER — JEVITY 1.2 CAL PO LIQD
1000.0000 mL | ORAL | Status: DC
  Administered 2012-05-05: 1000 mL
  Filled 2012-05-05 (×2): qty 1000

## 2012-05-05 MED ORDER — WHITE PETROLATUM GEL
Status: AC
  Administered 2012-05-05: 01:00:00
  Filled 2012-05-05: qty 5

## 2012-05-05 NOTE — Progress Notes (Signed)
Utilization Review Completed. 05/05/2012  

## 2012-05-05 NOTE — Progress Notes (Signed)
Patient's case discussed with resident team and I agree with their management plan as documented in this note.  Paula Compton, MD

## 2012-05-05 NOTE — Progress Notes (Addendum)
NUTRITION FOLLOW UP / CONSULT  Intervention:    Change formula to fiber-containing formula of Jevity 1.2. Initiate formula at 20 ml/hr. If patient tolerates, advance to goal rate of 37 ml/hr in 4 hours. Goal TF rate will provide: 1066 kcal, 49 grams protein, 716 ml free water.   Current free water flushes of 45 ml q 2 hours provides an additional 540 ml free water daily for a total of 1256 ml total. Continue.  Add liquid MVI - goal regimen will not meet 100% of RDIs  RD to continue to follow nutrition care plan  Nutrition Dx:   Inadequate enteral nutrition infusion related to possible increased needs as evidenced by weight loss with stable EN regimen. Resolved.  New Nutrition Dx: Inadequate oral intake r/t inability to eat AEB NPO status.  Goal:   Initiation of EN to meet >/=90% estimated nutrition needs. Ongoing.  Monitor:   PO intake, weight trends, labs, I/O's, TF tolerance  Assessment:   Pt with hx of CP, has J tube for home enteral nutrition. Was admitted with possible infection at J tube site, and PNA. Weight hx shows 23 lbs weight loss in the past 4 months (24% body weight, severe weight loss). Confirmed weight in bed. Family did not notice any weight loss, was shocked with pt's weight when RD weighed pt in bed.  RD consulted for management of enteral nutrition. Extensively discussed nutrition hx with mom at bedside. She is shocked at patient's dramatic weight loss.   RD paged by RN and case manager with updated weight. Pt was re-weighed in a different bed scale and weight is 96 lb. This is consistent with weight hx.  Because of ongoing chronic constipation, recommend fiber-containing formula. Discussed with Dr. Durene Cal and mom, both are in agreement.   Note: mom is interested in switching enteral nutrition supplier to [facility]. This RD informed Case Manager, [facility].  Height: Ht Readings from Last 1 Encounters:  05/04/12 5\' 2"  (1.575 m)    Weight  Status:   Wt Readings from Last 1 Encounters:  05/05/12 74 lb 15.3 oz (34 kg)    Re-estimated needs:  Kcal: 1000 - 1200  Protein: 34 - 40 grams Fluid:  >/= 1.2 liters  Skin: intact  Diet Order: NPO   Intake/Output Summary (Last 24 hours) at 05/05/12 0935 Last data filed at 05/05/12 0333  Gross per 24 hour  Intake      0 ml  Output    925 ml  Net   -925 ml    Last BM: 3/12   Labs:   Recent Labs Lab 05/04/12 0051 05/05/12 0420  NA 131* 135  K 3.6 3.2*  CL 91* 98  CO2 31 27  BUN 6 4*  CREATININE 0.28* 0.21*  CALCIUM 9.8 9.1  GLUCOSE 91 94    CBG (last 3)   Recent Labs  05/04/12 1105  GLUCAP 64*    Scheduled Meds: . baclofen  20 mg Per Tube TID  . divalproex  250 mg Oral Q24H  . divalproex  375 mg Oral Custom  . feeding supplement (OSMOLITE 1.2 CAL)  1,000 mL Per Tube Q24H  . free water  45 mL Per Tube Q2H  . gabapentin  300 mg Per Tube Custom  . Gerhardt's butt cream  1 application Topical BID  . glycopyrrolate  2 mg Per Tube BID  . heparin  5,000 Units Subcutaneous Q8H  . pantoprazole sodium  40 mg Per Tube BID  .  piperacillin-tazobactam (ZOSYN)  IV  3.375 g Intravenous Q8H  . potassium chloride  10 mEq Intravenous Q1 Hr x 4  . sodium chloride  3 mL Intravenous Q12H  . vancomycin  750 mg Intravenous Q8H    Continuous Infusions: . sodium chloride Stopped (05/04/12 1610)    Jarold Motto MS, RD, LDN Pager: [phone] After-hours pager: [phone]

## 2012-05-05 NOTE — Progress Notes (Signed)
ANTIBIOTIC CONSULT NOTE - FOLLOW UP  Pharmacy Consult for Vancomycin and Zosyn Indication: j-tube site infection  Allergies  Allergen Reactions  . Sulfamethoxazole W-Trimethoprim Rash    "don't know how bad" /father 12/17/2011    Patient Measurements: Height: 5\' 2"  (157.5 cm) Weight: 74 lb 15.3 oz (34 kg) IBW/kg (Calculated) : 54.6  Vital Signs: Temp: 97.2 F (36.2 C) (03/12 1201) Temp src: Axillary (03/12 1201) BP: 107/63 mmHg (03/12 1201) Pulse Rate: 80 (03/12 1201) Intake/Output from previous day: 03/11 0701 - 03/12 0700 In: -  Out: 1125 [Urine:1125] Intake/Output from this shift: Total I/O In: 553 [I.V.:453; Other:100] Out: 1600 [Urine:1600]  Labs:  Recent Labs  05/04/12 0051 05/05/12 0420  WBC 13.1* 9.3  HGB 15.4 12.9*  PLT 170 201  CREATININE 0.28* 0.21*   Estimated Creatinine Clearance: 69.7 ml/min (by C-G formula based on Cr of 0.21).  Recent Labs  05/05/12 1140  VANCOTROUGH 9.5*     Microbiology: Recent Results (from the past 720 hour(s))  CULTURE, BLOOD (ROUTINE X 2)     Status: None   Collection Time    05/04/12  1:10 AM      Result Value Range Status   Specimen Description BLOOD LEFT ARM   Final   Special Requests BOTTLES DRAWN AEROBIC ONLY 5CC   Final   Culture  Setup Time 05/04/2012 09:41   Final   Culture     Final   Value:        BLOOD CULTURE RECEIVED NO GROWTH TO DATE CULTURE WILL BE HELD FOR 5 DAYS BEFORE ISSUING A FINAL NEGATIVE REPORT   Report Status PENDING   Incomplete  URINE CULTURE     Status: None   Collection Time    05/04/12  1:30 AM      Result Value Range Status   Specimen Description URINE, RANDOM   Final   Special Requests NONE   Final   Culture  Setup Time 05/04/2012 10:15   Final   Colony Count NO GROWTH   Final   Culture NO GROWTH   Final   Report Status 05/05/2012 FINAL   Final  MRSA PCR SCREENING     Status: None   Collection Time    05/04/12  8:20 AM      Result Value Range Status   MRSA by PCR NEGATIVE   NEGATIVE Final   Comment:            The GeneXpert MRSA Assay (FDA     approved for NASAL specimens     only), is one component of a     comprehensive MRSA colonization     surveillance program. It is not     intended to diagnose MRSA     infection nor to guide or     monitor treatment for     MRSA infections.  CULTURE, BLOOD (ROUTINE X 2)     Status: None   Collection Time    05/04/12 10:18 AM      Result Value Range Status   Specimen Description BLOOD RIGHT HAND   Final   Special Requests BOTTLES DRAWN AEROBIC ONLY 4CC   Final   Culture  Setup Time 05/04/2012 13:56   Final   Culture     Final   Value:        BLOOD CULTURE RECEIVED NO GROWTH TO DATE CULTURE WILL BE HELD FOR 5 DAYS BEFORE ISSUING A FINAL NEGATIVE REPORT   Report Status PENDING   Incomplete  Anti-infectives   Start     Dose/Rate Route Frequency Ordered Stop   05/04/12 1400  piperacillin-tazobactam (ZOSYN) IVPB 3.375 g     3.375 g 12.5 mL/hr over 240 Minutes Intravenous 3 times per day 05/04/12 0750     05/04/12 1200  vancomycin (VANCOCIN) 750 mg in sodium chloride 0.9 % 150 mL IVPB     750 mg 150 mL/hr over 60 Minutes Intravenous Every 8 hours 05/04/12 0750     05/04/12 0345  vancomycin (VANCOCIN) IVPB 1000 mg/200 mL premix     1,000 mg 200 mL/hr over 60 Minutes Intravenous  Once 05/04/12 0339 05/04/12 0613   05/04/12 0345  piperacillin-tazobactam (ZOSYN) IVPB 3.375 g     3.375 g 100 mL/hr over 30 Minutes Intravenous  Once 05/04/12 9629 05/04/12 0641      Assessment: 22 year old male on Day #2 of Vancomycin and Zosyn for j-tube site infection.  His Vancomycin trough is almost at goal of 10-15 mcg/ml and I expect he will continue to accumulate vancomycin to achieve his goal.  His Zosyn dose is appropriate.  Goal of Therapy:  Vancomycin trough level 10-15 mcg/ml  Plan:  Continue Vancomycin 750mg  IV q8h Continue Zosyn 3.375gm IV q8h Follow renal function and micro data  Estella Husk, Pharm.D.,  BCPS Clinical Pharmacist  Phone [phone] Pager [phone] 05/05/2012, 1:13 PM

## 2012-05-05 NOTE — Progress Notes (Signed)
Family Medicine Teaching Service Daily Progress Note Service Page: [phone]  Subjective: doing well per mom. Not quite back to normal self, but close. Mom states wants GI referral as outpatient, and would like it to not be at [facility] as she had bad experience there previously.  Objective: Temp:  [97 F (36.1 C)-98.2 F (36.8 C)] 97 F (36.1 C) (03/12 0903) Pulse Rate:  [80-110] 110 (03/12 0903) Resp:  [13-22] 16 (03/12 0903) BP: (97-132)/(49-90) 115/71 mmHg (03/12 0903) SpO2:  [94 %-100 %] 98 % (03/12 0903) Weight:  [74 lb 15.3 oz (34 kg)] 74 lb 15.3 oz (34 kg) (03/12 0007) Exam: General: NAD, resting comfortably in bed Cardiovascular: rrr, no mrg Respiratory: CTAB anteriorly Abdomen: s, NT, ND, J tube in place with surrounding erythema and 2x2 cm area of induration Ext: no edema  CBC BMET   Recent Labs Lab 05/04/12 0051 05/05/12 0420  WBC 13.1* 9.3  HGB 15.4 12.9*  HCT 42.8 37.6*  PLT 170 201    Recent Labs Lab 05/04/12 0051 05/05/12 0420  NA 131* 135  K 3.6 3.2*  CL 91* 98  CO2 31 27  BUN 6 4*  CREATININE 0.28* 0.21*  GLUCOSE 91 94  CALCIUM 9.8 9.1     Lactic acid 2.0 Procalcitonin <0.10 VPA level 93.1  Imaging/Diagnostic Tests: Ct Abdomen Pelvis Wo Contrast  05/04/2012  IMPRESSION: The gastrojejunostomy tube remains in place.  There is subcutaneous fat stranding along the lateral abdominal wall at this level, which may reflect cellulitis.  No extravasation of injected contrast or associated loculated fluid collection identified, within limitations of a nonenhanced examination.  Retrocardiac opacity is nonspecific; atelectasis, aspiration, or infiltrate.   Bony exostosis arising from the proximal left femoral shaft. Correlate clinically for stability.   Original Report Authenticated By: Jearld Lesch, M.D.    Dg Chest 1 View  05/04/2012   IMPRESSION: Mild retrocardiac opacity; aspiration, atelectasis, or infiltrate.   Original Report Authenticated By:  Jearld Lesch, M.D.    Assessment/Plan: CHUKWUMA STRAUS is a 22 y.o. year old male presenting with sepsis secondary to J tube infection, likely cellulitis.   1. Sepsis (fever to 101.7 and tachy to 127) secondary to J tube infection/cellulitis -Vanc/Zosyn per pharmacy  -improved WBC today and remains afebrile  -Tylenol per tube, continue nystatin cream  -Vitals per unit -IR c/s and appreciate their input-state continue IV antibiotics, if no improvement will replace J tube -restarted tube feeds  2. Possible retrocardiac pneumonia with Hx of CPAP  -Chest PT QID -Albuterol every four hours, consider switching to PRN  -Continuous pulse ox, O2 if <92%, does not tolerate CPAP (followed by Dr. Delton Coombes in outpatient setting) -antibiotics per above  3. Hx of reflux - Continue Prilosec per tube  -Keep bed elevated > 30 degrees  4. Hx of MRCP  -Continue Gabapentin, Robinul, Baclofen and Depakote   FEN/GI: NS 125 cc/hr, Omeprazole solution, tube feeds per dietician  Prophylaxis: Heparin SQ Disposition: SDU pending clinical improvement  Code Status: Limited Code (No intubation/CPR but possible trach tube for suction)  Marikay Alar, MD 05/05/2012, 9:14 AM

## 2012-05-05 NOTE — Progress Notes (Addendum)
FMTS Attending Admission Note: Todd Pagan MD  I  have seen and examined this patient, reviewed their chart. I have discussed this patient with the resident. I agree with the resident's findings, assessment and care plan.  Patient is doing well today as per his mother,he does still have induration around his GJ tube,does have very small yellowish drainage,he is however doing well on antibiotic,afebrile for over 24 hrs.Of concern is his BP and h/H which are dropping,he recently resumed diet which will help bump up his BP in addition to fluid. Recheck H/H as discussed with resident,at the moment no source of bleeding. We will continue to monitor his BP for now.

## 2012-05-06 ENCOUNTER — Inpatient Hospital Stay (HOSPITAL_COMMUNITY): Payer: BC Managed Care – PPO

## 2012-05-06 LAB — BASIC METABOLIC PANEL
CO2: 25 mEq/L (ref 19–32)
Glucose, Bld: 88 mg/dL (ref 70–99)
Potassium: 3.5 mEq/L (ref 3.5–5.1)
Sodium: 140 mEq/L (ref 135–145)

## 2012-05-06 LAB — CBC
Hemoglobin: 12.2 g/dL — ABNORMAL LOW (ref 13.0–17.0)
MCHC: 34.6 g/dL (ref 30.0–36.0)
RBC: 3.93 MIL/uL — ABNORMAL LOW (ref 4.22–5.81)

## 2012-05-06 MED ORDER — MORPHINE SULFATE 2 MG/ML IJ SOLN
INTRAMUSCULAR | Status: AC
  Filled 2012-05-06: qty 1

## 2012-05-06 MED ORDER — IOHEXOL 300 MG/ML  SOLN
50.0000 mL | Freq: Once | INTRAMUSCULAR | Status: AC | PRN
  Administered 2012-05-06: 20 mL via INTRAVENOUS

## 2012-05-06 MED ORDER — MORPHINE SULFATE 2 MG/ML IJ SOLN
0.5000 mg | Freq: Once | INTRAMUSCULAR | Status: AC
  Administered 2012-05-06: 0.5 mg via INTRAVENOUS

## 2012-05-06 NOTE — Progress Notes (Addendum)
PGY-2 Progress note:  Called by RN that pt's BP was 90's systolic. Appears pt has had intermittent lows in his BP in the past. He did receive Morphine earlier for his procedure. He is on IVF at 115cc/hr, and his feeds were just restarted. Anticipate that his BP will improve some with feeds. BP 114 during my exam. Continue to monitor on telemetry.   Amber M. Hairford, M.D. 05/06/2012 4:59 PM

## 2012-05-06 NOTE — Progress Notes (Signed)
Mother called, checking on son. Update mother on pt and situation with J-tube, mother insist that this nurse gives Depakote and stated how pt will have a big seizure if medication missed. Mother states "I know you can't go against the Dr.'s but it is ok, this happens all the time at home, please just give it".  Will report off to oncoming nurse.

## 2012-05-06 NOTE — Progress Notes (Signed)
1414:  PA from IR assessed pt, removed split gauze from site and pushed plastic piece flush with skin, encouraged not applying split gauze under plastic piece due to possible extrusion of tube with increased distance between plastic piece and skin.  However, pt father requested one ply of split gauze be placed under plastic piece due to skin irratation concern.  PA complied with father and placed one layer and reiterated concern.  Nurse will continue to monitor.  1400: Nurse was called into pt room by father due to concern with peg site.  Upon assessment, nurse found split gauze dressing to be saturated with copious secretions. Nurse cleansed site, applied new dressing and contacted IR to raise concerns.  IR stated they would be around to assess pt.

## 2012-05-06 NOTE — Progress Notes (Signed)
RT attempted to perform chest PT. RN told RT to hold off because PT going to IR for a replacement peg tube. Previous peg tube had came out. RN afraid chest PT would irritate peg tube site further. RT will continue to monitor.

## 2012-05-06 NOTE — Progress Notes (Signed)
FMTS Attending Admission Note: Todd Eniola,MD I  have seen and examined this patient, reviewed their chart. I have discussed this patient with the resident. I agree with the resident's findings, assessment and care plan.  Patient has been afebrile,his mother was not available when I went in to examine him,he is however in a stable condition,his j-tube site is well dressed with tube help in place with tape,continue A/B therapy for now,resident to reassess after J-tube replacement.

## 2012-05-06 NOTE — Progress Notes (Signed)
2200: Informed by other RN that pt's J tube was out. Noted water balloon used to hold J tube in place right outside pt's body. Yellow and serosanguinous drainage noted. Tube feeding was on hold, now turned off. Noted only change from first assessment was PT therapy and rotational bed turns.  MD  Called notified. Informed to hold tube feedings, drs site and IR will see pt in AM.  J-tube and site cleaned, balloon deflated pushed against skin, attempt to re-inflated. Noted upon inflation, site pours out yellow/serosanguinous fluid and noted pressure to increase to left of J-tube, pt expresses discomfort. Balloon deflated, 4cc placed in balloon. Noted increase in drainage once again but not as much pressure or discomfort. Site clean, drsed, and taped.  MD called, notified of this nurse assessment about J-tube site, color, discomfort, and drainage as well of Peg tube medications. Will hold medications and instructed that IR will see pt in AM.

## 2012-05-06 NOTE — Progress Notes (Signed)
NUTRITION FOLLOW UP  Intervention:    Once j-tube replaced and ready to use, re-initiate Jevity 1.2 at 37 ml/hr. Goal TF rate will provide: 1066 kcal, 49 grams protein, 716 ml free water.   Continue free water flushes and liquid MVI.  RD to continue to follow nutrition care plan.  Nutrition Dx: Inadequate oral intake r/t inability to eat AEB NPO status. Ongoing.  Goal:   Initiation of EN to meet >/=90% estimated nutrition needs. Unmet - TF on hold for J-tube replacement.  Monitor:   PO intake, weight trends, labs, I/O's, TF tolerance  Assessment:   Pt with hx of CP, has J tube for home enteral nutrition. Was admitted with possible infection at J tube site, and PNA. Weight hx shows 23 lbs weight loss in the past 4 months (24% body weight, severe weight loss). Confirmed weight in bed. Family did not notice any weight loss, was shocked with pt's weight when RD weighed pt in bed.  Pt's J-tube was inadvertently removed around 2200 last evening. Noted to have yellow and serosanguinous drainage. MD was notified and TF was held. TF continue to be held at this time. Plan to replace J-tube today.  Per chart, pt was at goal of Jevity 1.2 at 37 ml/hr prior to j-tube incident yesterday. Recommend resuming this formula and rate once j-tube replaced and ok to use.  Height: Ht Readings from Last 1 Encounters:  05/04/12 5\' 2"  (1.575 m)    Weight Status:   Wt Readings from Last 1 Encounters:  05/05/12 96 lb 12.8 oz (43.908 kg)    Re-estimated needs:  Kcal: 1000 - 1200  Protein: 34 - 40 grams Fluid:  >/= 1.2 liters  Skin: intact  Diet Order: NPO   Intake/Output Summary (Last 24 hours) at 05/06/12 0931 Last data filed at 05/06/12 0900  Gross per 24 hour  Intake   2441 ml  Output   3750 ml  Net  -1309 ml    Last BM: 3/12   Labs:   Recent Labs Lab 05/04/12 0051 05/05/12 0420 05/06/12 0540  NA 131* 135 140  K 3.6 3.2* 3.5  CL 91* 98 104  CO2 31 27 25   BUN 6 4* <3*   CREATININE 0.28* 0.21* <0.20*  CALCIUM 9.8 9.1 9.3  GLUCOSE 91 94 88    CBG (last 3)   Recent Labs  05/04/12 1105  GLUCAP 64*    Scheduled Meds: . baclofen  20 mg Per Tube TID  . divalproex  250 mg Oral Q24H  . divalproex  375 mg Oral Custom  . free water  45 mL Per Tube Q2H  . gabapentin  300 mg Per Tube Custom  . Gerhardt's butt cream  1 application Topical BID  . glycopyrrolate  2 mg Per Tube BID  . multivitamin  5 mL Per Tube Daily  . pantoprazole sodium  40 mg Per Tube BID  . piperacillin-tazobactam (ZOSYN)  IV  3.375 g Intravenous Q8H  . sodium chloride  3 mL Intravenous Q12H  . vancomycin  750 mg Intravenous Q8H    Continuous Infusions: . sodium chloride 1,000 mL (05/06/12 0657)  . feeding supplement (JEVITY 1.2 CAL) 1,000 mL (05/05/12 1534)    Jarold Motto MS, RD, LDN Pager: [phone] After-hours pager: [phone]

## 2012-05-06 NOTE — Progress Notes (Signed)
Family Medicine Teaching Service Daily Progress Note Service Page: [phone]  Subjective: J tube balloon came out overnight.   Objective: Temp:  [97.1 F (36.2 C)-97.8 F (36.6 C)] 97.8 F (36.6 C) (03/13 0810) Pulse Rate:  [59-98] 91 (03/13 0810) Resp:  [14-29] 22 (03/13 0810) BP: (105-129)/(52-89) 107/89 mmHg (03/13 0810) SpO2:  [92 %-100 %] 96 % (03/13 0800) Weight:  [96 lb 12.8 oz (43.908 kg)] 96 lb 12.8 oz (43.908 kg) (03/12 1400) Exam: General: NAD, resting comfortably in bed Cardiovascular: rrr, no mrg Respiratory: CTAB anteriorly Abdomen: s, NT, ND, J tube in place with surrounding erythema 2x2 cm area and induration Ext: no edema  CBC BMET   Recent Labs Lab 05/05/12 0420 05/05/12 1553 05/06/12 0540  WBC 9.3 8.7 6.3  HGB 12.9* 12.0* 12.2*  HCT 37.6* 34.1* 35.3*  PLT 201 195 180    Recent Labs Lab 05/04/12 0051 05/05/12 0420 05/06/12 0540  NA 131* 135 140  K 3.6 3.2* 3.5  CL 91* 98 104  CO2 31 27 25   BUN 6 4* <3*  CREATININE 0.28* 0.21* <0.20*  GLUCOSE 91 94 88  CALCIUM 9.8 9.1 9.3     Lactic acid 2.0 Procalcitonin <0.10 VPA level 93.1 BCx NGTD  Imaging/Diagnostic Tests: Ct Abdomen Pelvis Wo Contrast  05/04/2012  IMPRESSION: The gastrojejunostomy tube remains in place.  There is subcutaneous fat stranding along the lateral abdominal wall at this level, which may reflect cellulitis.  No extravasation of injected contrast or associated loculated fluid collection identified, within limitations of a nonenhanced examination.  Retrocardiac opacity is nonspecific; atelectasis, aspiration, or infiltrate.   Bony exostosis arising from the proximal left femoral shaft. Correlate clinically for stability.   Original Report Authenticated By: Jearld Lesch, M.D.    Dg Chest 1 View  05/04/2012   IMPRESSION: Mild retrocardiac opacity; aspiration, atelectasis, or infiltrate.   Original Report Authenticated By: Jearld Lesch, M.D.    Assessment/Plan: [name] is a 22 y.o. year old male presenting with sepsis secondary to J tube infection, likely cellulitis.   1. Sepsis (fever to 101.7 and tachy to 127) secondary to J tube infection/cellulitis. Erythema minimally improved and still with induration. -Vanc/Zosyn per pharmacy  -improved WBC today and remains afebrile  -Tylenol per tube, continue nystatin cream  -Vitals per unit -IR c/s and appreciate their input-state continue IV antibiotics, will replace J tube today -holding tube feeds  2. Possible retrocardiac pneumonia with Hx of CPAP  -Chest PT QID -Albuterol every four hours, consider switching to PRN  -Continuous pulse ox, O2 if <92%, does not tolerate CPAP (followed by Dr. Delton Coombes in outpatient setting) -antibiotics per above  3. Hx of reflux - Continue Prilosec per tube  -Keep bed elevated > 30 degrees  4. Hx of MRCP  -Continue Gabapentin, Robinul, Baclofen and Depakote-being held at this time given no J tube access  5. J tube: balloon came out overnight. IR to see today and replace. -will need GI referral as outpatient   6. Anemia: patient with drop following admission. No sources of bleeding at this time. Recheck remains stable. -stopped heparin considering ?bleed, though no source -likely related to volume repletion and dilution  FEN/GI: Omeprazole solution, tube feeds per dietician (being held due to J tube loss) Prophylaxis: SCDs Disposition: SDU pending clinical improvement  Code Status: Limited Code (No intubation/CPR but possible trach tube for suction)  Marikay Alar, MD 05/06/2012, 9:17 AM

## 2012-05-06 NOTE — Procedures (Signed)
Replacement of 2f SL GJ tube No complication No blood loss. See complete dictation in [facility].

## 2012-05-07 LAB — CBC
HCT: 39 % (ref 39.0–52.0)
MCH: 31.3 pg (ref 26.0–34.0)
MCV: 89.9 fL (ref 78.0–100.0)
Platelets: 111 10*3/uL — ABNORMAL LOW (ref 150–400)
RDW: 13.7 % (ref 11.5–15.5)

## 2012-05-07 LAB — BASIC METABOLIC PANEL
BUN: 3 mg/dL — ABNORMAL LOW (ref 6–23)
CO2: 28 mEq/L (ref 19–32)
Calcium: 9.3 mg/dL (ref 8.4–10.5)
Creatinine, Ser: 0.29 mg/dL — ABNORMAL LOW (ref 0.50–1.35)
GFR calc Af Amer: >90 mL/min (ref >90)

## 2012-05-07 MED ORDER — RANITIDINE HCL 150 MG/10ML PO SYRP
150.0000 mg | ORAL_SOLUTION | Freq: Two times a day (BID) | ORAL | Status: DC
  Administered 2012-05-07: 150 mg via ORAL
  Filled 2012-05-07 (×3): qty 10

## 2012-05-07 MED ORDER — CLINDAMYCIN PALMITATE HCL 75 MG/5ML PO SOLR
300.0000 mg | Freq: Three times a day (TID) | ORAL | Status: DC
  Administered 2012-05-07: 300 mg via ORAL
  Filled 2012-05-07 (×3): qty 20

## 2012-05-07 MED ORDER — JEVITY 1.2 CAL PO LIQD: 1000.0000 mL | ORAL | Status: AC

## 2012-05-07 MED ORDER — SILVER NITRATE-POT NITRATE 75-25 % EX MISC
1.0000 "application " | Freq: Once | CUTANEOUS | Status: AC
  Administered 2012-05-07: 1 via TOPICAL
  Filled 2012-05-07: qty 1

## 2012-05-07 MED ORDER — POTASSIUM CHLORIDE 10 MEQ/100ML IV SOLN
INTRAVENOUS | Status: AC
  Filled 2012-05-07: qty 100

## 2012-05-07 MED ORDER — POTASSIUM CHLORIDE 10 MEQ/100ML IV SOLN
10.0000 meq | INTRAVENOUS | Status: AC
  Administered 2012-05-07 (×4): 10 meq via INTRAVENOUS
  Filled 2012-05-07 (×3): qty 100

## 2012-05-07 MED ORDER — CLINDAMYCIN PALMITATE HCL 75 MG/5ML PO SOLR: 300.0000 mg | Freq: Three times a day (TID) | ORAL | Status: DC

## 2012-05-07 NOTE — Progress Notes (Signed)
Family Medicine Teaching Service Daily Progress Note Service Page: [phone]  Subjective: patient doing well since J tube replacement. Mom states site looks better and there has been no drainage. Asking about silver nitrate for small growth and GI referral.  Objective: Temp:  [97.2 F (36.2 C)-97.7 F (36.5 C)] 97.7 F (36.5 C) (03/14 0300) Pulse Rate:  [63-92] 79 (03/14 0500) Resp:  [14-23] 18 (03/14 0500) BP: (92-133)/(50-94) 104/74 mmHg (03/14 0500) SpO2:  [94 %-100 %] 97 % (03/14 0500) Weight:  [83 lb 1.8 oz (37.7 kg)] 83 lb 1.8 oz (37.7 kg) (03/13 2355) Exam: General: NAD, resting comfortably in bed Cardiovascular: rrr, no mrg Respiratory: CTAB anteriorly Abdomen: s, NT, ND, J tube in place with improved erythema, still with induration Ext: no edema  CBC BMET   Recent Labs Lab 05/05/12 1553 05/06/12 0540 05/07/12 0420  WBC 8.7 6.3 7.0  HGB 12.0* 12.2* 13.6  HCT 34.1* 35.3* 39.0  PLT 195 180 111*    Recent Labs Lab 05/05/12 0420 05/06/12 0540 05/07/12 0420  NA 135 140 145  K 3.2* 3.5 3.3*  CL 98 104 104  CO2 27 25 28   BUN 4* <3* 3*  CREATININE 0.21* <0.20* 0.29*  GLUCOSE 94 88 159*  CALCIUM 9.1 9.3 9.3     Lactic acid 2.0 Procalcitonin <0.10 VPA level 93.1 BCx NGTD  Imaging/Diagnostic Tests: Ct Abdomen Pelvis Wo Contrast  05/04/2012  IMPRESSION: The gastrojejunostomy tube remains in place.  There is subcutaneous fat stranding along the lateral abdominal wall at this level, which may reflect cellulitis.  No extravasation of injected contrast or associated loculated fluid collection identified, within limitations of a nonenhanced examination.  Retrocardiac opacity is nonspecific; atelectasis, aspiration, or infiltrate.   Bony exostosis arising from the proximal left femoral shaft. Correlate clinically for stability.   Original Report Authenticated By: Jearld Lesch, M.D.    Dg Chest 1 View  05/04/2012   IMPRESSION: Mild retrocardiac opacity; aspiration,  atelectasis, or infiltrate.   Original Report Authenticated By: Jearld Lesch, M.D.    Assessment/Plan: SERIGNE KUBICEK is a 22 y.o. year old male presenting with sepsis secondary to J tube infection, likely cellulitis.   1. Sepsis (fever to 101.7 and tachy to 127) secondary to J tube infection/cellulitis. Erythema minimally improved and still with induration. -Vanc/Zosyn to be transitioned to clinda per J tube  -stable WBC today and remains afebrile  -Tylenol per tube, continue nystatin cream  -Vitals per unit -IR c/s and appreciate their input-replaced J tube yesterday -restarted tube feeds  2. Possible retrocardiac pneumonia with Hx of CPAP-has had stable lung exam  -Chest PT QID -Albuterol every four hours, consider switching to PRN  -Continuous pulse ox, O2 if <92%, does not tolerate CPAP (followed by Dr. Delton Coombes in outpatient setting) -antibiotics per above  3. Hx of reflux - Continue Prilosec per tube  -Keep bed elevated > 30 degrees  4. Hx of MRCP  -Continue Gabapentin, Robinul, Baclofen and Depakote-being held at this time given no J tube access  5. J tube: balloon came out overnight. IR to see today and replace. -will need GI referral as outpatient   6. Anemia: patient with drop following admission. No sources of bleeding at this time. Recheck improved. -stopped heparin considering ?bleed, though no source -likely related to volume repletion and dilution  7. Thrombocytopenia: question related to protonix use as this is different PPI than home vs rebound from acute phase reactant stage -will change to ranitidine -potentially will  obtain CBC prior to discharge  FEN/GI: ranitidine, tube feeds per dietician  Prophylaxis: SCDs Disposition: SDU, possible discharge today if tolerates PO antibiotics  Code Status: Limited Code (No intubation/CPR but possible trach tube for suction)  Marikay Alar, MD 05/07/2012, 8:42 AM

## 2012-05-07 NOTE — Discharge Summary (Signed)
Physician Discharge Summary  Patient ID: [name] MRN: [id number] DOB: 29-Oct-1990 Age: 22 y.o.  Admit date: 05/04/2012 Discharge date: 05/07/2012 Admitting Physician: Barbaraann Barthel, MD  PCP: Priscella Mann, MD  Consultants: IR     Discharge Diagnosis: Principal Problem:   Sepsis Active Problems:   MENTAL RETARDATION   CEREBRAL PALSY   SEIZURE DISORDER   Cellulitis    Hospital Course [name] is a 22 y.o. year old male presenting with sepsis secondary to J tube infection, likely cellulitis.   1. Sepsis: patient presented to the ED following mother noticing area of erythema around J-tube. Patient had fever to 101.7 and tachycardia qualifying him for sepsis. Patient was started on vanc/zosyn on admission. Initially IR was consulted for possible J-tube replacement and recommended a trial of antibiotics prior to replacing J-tube. During hospitalization J-tube balloon came out and J tube was replaced. Patient's cellulitis improved greatly following J-tube replacement and treatment with antibiotics. His WBC, temperature, and HR returned to normal values at time of discharge. Erythema was improved and patient was transitioned to PO clindamycin and tolerated this well prior to discharge. During the initial phase of treatment the patients tube feeds were held. These were restarted prior to discharge.  2. Possible retrocardiac pneumonia with Hx of HCAP: noted on CXR at admission. Patient was treated with vanc/zosyn for above cellulitis and sepsis. Patient maintained O2 sats and was continued on chest PT. Clindamycin was felt to provide good coverage for PNA at time of discharge.   3. Hx of reflux: on prilosec at home. Was on protonix in hospital. This was changed to zantac following drop in platelets to 111 from 180.  4. Hx of MRCP: Continue Gabapentin, Robinul, Baclofen and Depakote-being held at this time given no J tube access   5. J tube: consult to IR to replace J-tube given  surrounding cellulitis and balloon falling out. Had this replaced and tolerated this well. Ambulatory GI referral placed.   6. Anemia: patient with drop following admission. No evident sources of bleeding. Heparin was stopped and patient was placed on SCDs for prophylaxis. Felt likely related to volume repletion and dilution. Was improve to normal range at time of discharge.   7. Thrombocytopenia: initially in normal range. On day of discharge dropped to 111. This is close to his baseline of 130'-140's. Felt this was likely due to resolution of acute phase reactant property of platelets. Protonix was discontinued due to side effect profile of thrombocytopenia. Was started on zantac in place of this.   8. Hypokalemia: patient found to have potassium of 3.3 day of discharge and repleted.  Problem List 1. Sepsis 2. Cellulitis 3. Retrocardiac infiltrate 4. GERD 5. J-tube 6. Anemia 7. Thrombocytopenia 8. Hypokalemia  9. MRCP        Discharge PE   Filed Vitals:   05/07/12 1500  BP: 121/95  Pulse: 72  Temp:   Resp: 12   General: NAD, resting comfortably in bed  Cardiovascular: rrr, no mrg  Respiratory: CTAB anteriorly  Abdomen: s, NT, ND, J tube in place with improved erythema, still with induration  Ext: no edema   Procedures/Imaging:  Ct Abdomen Pelvis Wo Contrast  05/04/2012  *RADIOLOGY REPORT*  Clinical Data: GJ tube infection  CT ABDOMEN AND PELVIS WITHOUT CONTRAST  Technique:  Multidetector CT imaging of the abdomen and pelvis was performed following the standard protocol without intravenous contrast.  Comparison: None.  Findings: Retrocardiac opacity.  Heart size within normal limits.  Organ abnormality/ pathology detection is limited in the absence of intravenous contrast.  Images are further degraded by streak artifact from the patient's vertebral rods and extremity positioning.  Within these limitations, unremarkable liver, spleen, pancreas, biliary system, adrenal glands,  kidneys.  No hydronephrosis or hydroureter.  No bowel obstruction.  No CT evidence for colitis. Gastrojejunostomy tube in place.  There is subcutaneous fat stranding along the left lateral abdominal wall at the level of the gastrostomy tube.  No loculated/drainable fluid collection is visualized.  No free intraperitoneal air or fluid.  No appreciable lymphadenopathy.  Partially decompressed bladder.  Normal caliber aorta and branch vessels.  Paraspinal muscle atrophy.  Bony exostosis arising from the proximal left femoral shaft.  Irregularity of the right femoral head.  No acute osseous finding.  IMPRESSION: The gastrojejunostomy tube remains in place.  There is subcutaneous fat stranding along the lateral abdominal wall at this level, which may reflect cellulitis.  No extravasation of injected contrast or associated loculated fluid collection identified, within limitations of a nonenhanced examination.  Retrocardiac opacity is nonspecific; atelectasis, aspiration, or infiltrate.   Bony exostosis arising from the proximal left femoral shaft. Correlate clinically for stability.   Original Report Authenticated By: Jearld Lesch, M.D.    Dg Chest 1 View  05/04/2012  *RADIOLOGY REPORT*  Clinical Data: Fever  CHEST - 1 VIEW  Comparison: 12/23/2011  Findings: Vertebral rods are unchanged.  Heart size upper normal. Retrocardiac opacity.  No pleural effusion or pneumothorax.  No acute osseous finding.  IMPRESSION: Mild retrocardiac opacity; aspiration, atelectasis, or infiltrate.   Original Report Authenticated By: Jearld Lesch, M.D.    Ir Replc Duoden/jejuno Tube Percut W/fluoro  05/06/2012  *RADIOLOGY REPORT*  Clinical data:  Cerebral palsy.  Malpositioned single-lumen gastrojejunostomy catheter.  EXCHANGE OF GASTROJEJUNOSTOMY CATHETER UNDER FLUOROSCOPY  Technique and findings: The procedure, risks (including but not limited to bleeding, infection, organ damage), benefits, and alternatives were explained to  the   patient's father.  Questions regarding the procedure were encouraged and answered.  The patient's father understands and consents to the procedure.  The previously placed gastrojejunostomy catheter and surrounding skin were prepped and draped in usual sterile fashion.  Under fluoroscopy, the catheter was injected, demonstrating tip is in the distal duodenum.  The retention balloon was deflated.  The catheter was exchanged over an angled stiff glide wire for a new 22-French single-lumen gastrojejunostomy catheter, positioned with its tip just beyond the ligament of Treitz.  The balloon was inflated with 10 ml sterile saline.  The external bumper was applied.  Contrast injection confirms appropriate catheter placement patency.  IMPRESSION: 1.  Technically successful exchange of 22-French single-lumen gastrojejunostomy catheter.   Original Report Authenticated By: D. Andria Rhein, MD     Labs  CBC  Recent Labs Lab 05/05/12 1553 05/06/12 0540 05/07/12 0420  WBC 8.7 6.3 7.0  HGB 12.0* 12.2* 13.6  HCT 34.1* 35.3* 39.0  PLT 195 180 111*   BMET  Recent Labs Lab 05/04/12 0051 05/05/12 0420 05/06/12 0540 05/07/12 0420  NA 131* 135 140 145  K 3.6 3.2* 3.5 3.3*  CL 91* 98 104 104  CO2 31 27 25 28   BUN 6 4* <3* 3*  CREATININE 0.28* 0.21* <0.20* 0.29*  CALCIUM 9.8 9.1 9.3 9.3  PROT 7.5  --   --   --   BILITOT 0.5  --   --   --   ALKPHOS 79  --   --   --  ALT 7  --   --   --   AST 11  --   --   --   GLUCOSE 91 94 88 159*   Results for orders placed during the hospital encounter of 05/04/12 (from the past 72 hour(s))  CBC     Status: Abnormal   Collection Time    05/07/12  4:20 AM      Result Value Range   WBC 7.0  4.0 - 10.5 K/uL   RBC 4.34  4.22 - 5.81 MIL/uL   Hemoglobin 13.6  13.0 - 17.0 g/dL   HCT 78.2  95.6 - 21.3 %   MCV 89.9  78.0 - 100.0 fL   MCH 31.3  26.0 - 34.0 pg   MCHC 34.9  30.0 - 36.0 g/dL   RDW 08.6  57.8 - 46.9 %   Platelets 111 (*) 150 - 400 K/uL    Comment: PLATELET COUNT CONFIRMED BY SMEAR     SPECIMEN CHECKED FOR CLOTS     DELTA CHECK NOTED  BASIC METABOLIC PANEL     Status: Abnormal   Collection Time    05/07/12  4:20 AM      Result Value Range   Sodium 145  135 - 145 mEq/L   Potassium 3.3 (*) 3.5 - 5.1 mEq/L   Chloride 104  96 - 112 mEq/L   CO2 28  19 - 32 mEq/L   Glucose, Bld 159 (*) 70 - 99 mg/dL   BUN 3 (*) 6 - 23 mg/dL   Creatinine, Ser 6.29 (*) 0.50 - 1.35 mg/dL   Calcium 9.3  8.4 - 52.8 mg/dL   GFR calc non Af Amer >90  >90 mL/min   GFR calc Af Amer >90  >90 mL/min   Comment:            The eGFR has been calculated     using the CKD EPI equation.     This calculation has not been     validated in all clinical     situations.     eGFR's persistently     <90 mL/min signify     possible Chronic Kidney Disease.       Patient condition at time of discharge/disposition: stable  Disposition-home   Follow up issues: 1. F/u GI referral-order placed for ambulatory referral while in hospital 2. F/u platelets and hgb given anemia and thrombocytopenia in hospital 3. F/u potassium given low in hospital.  Discharge follow up:  Follow-up Information   Follow up with [facility], Marylene Land, MD On 05/13/2012. (10:15 am)    Contact information:   [address] [place] [address] [phone]          Discharge Orders   Future Appointments Provider Department Dept Phone   05/13/2012 10:15 AM Shelly Rubenstein, MD [facility] FAM[facility]IC[facility] [phone]   Future Orders Complete By Expires     Call MD for:  difficulty breathing, headache or visual disturbances  As directed     Call MD for:  persistant dizziness or light-headedness  As directed     Call MD for:  persistant nausea and vomiting  As directed     Call MD for:  redness, tenderness, or signs of infection (pain, swelling, redness, odor or green/yellow discharge around incision site)  As directed     Diet - low sodium heart healthy  As directed      Increase activity slowly  As directed  Discharge Instructions: Please refer to Patient Instructions section of EMR for full details.  Patient was counseled important signs and symptoms that should prompt return to medical care, changes in medications, dietary instructions, activity restrictions, and follow up appointments.    Discharge Medications   Medication List    TAKE these medications       baclofen 10 MG tablet  Commonly known as:  LIORESAL  Take 20 mg by mouth 3 (three) times daily.     clindamycin 75 MG/5ML solution  Commonly known as:  CLEOCIN  Take 20 mLs (300 mg total) by mouth every 8 (eight) hours.     divalproex 125 MG capsule  Commonly known as:  DEPAKOTE SPRINKLE  Take 250-375 mg by mouth 3 (three) times daily. 375 mg in the morning, 250 mg midday, and 375 mg at bedtime     feeding supplement (JEVITY 1.2 CAL) Liqd  Place 1,000 mLs into feeding tube continuous.     gabapentin 300 MG capsule  Commonly known as:  NEURONTIN  1 tab per tube three times a day     Gerhardt's butt cream Crea  Apply 1 application topically 2 (two) times daily. Please provide remained of tube used in hospital     glycopyrrolate 2 MG tablet  Commonly known as:  ROBINUL  Take 2 mg by mouth 2 (two) times daily.     Incontinence Supplies Misc  May have incontinence supplies as determined by home health agency.     KANGAROO JOEY ENTERAL PUMP Misc  30 Units by Does not apply route daily. Please provide bags for Kangroo Joey enteral feeding device. Should be changed daily.     nystatin cream  Commonly known as:  MYCOSTATIN  Apply topically 2 (two) times daily as needed (Redness).     omeprazole 2 mg/mL Susp  Commonly known as:  PRILOSEC  Take 10 mLs (20 mg total) by mouth daily at 12 noon.     PROVENTIL HFA 108 (90 BASE) MCG/ACT inhaler  Generic drug:  albuterol  USE 2 PUFFS EVERY 4 HOURS AS NEEDED FOR WHEEZE       Marikay Alar, MD of [facility]  Practice 05/09/2012 11:39 AM

## 2012-05-07 NOTE — Progress Notes (Signed)
FMTS Attending Admission Note: Todd Eniola,MD I  have seen and examined this patient, reviewed their chart. I have discussed this patient with the resident. I agree with the resident's findings, assessment and care plan.  I examined patient this morning with his mother by his bed side,he seem comfortable.His J-tube is in place and the surrounding erythema has improved a lot,no pus or drainage noted.Patient may be changed from IV to oral antibiotic and plan to d/c home today or tomorrow.

## 2012-05-07 NOTE — Care Management Note (Signed)
    Page 1 of 2   05/07/2012     4:08:36 PM   CARE MANAGEMENT NOTE 05/07/2012  Patient:  [name], [name]   Account Number:  [id number]  Date Initiated:  05/05/2012  Documentation initiated by:  MAYO,HENRIETTA  Subjective/Objective Assessment:   22 yr-old male with cerebral palsy adm with dx of sepsis; lives with family, dependent for all ADLs and IADLs     Action/Plan:   Anticipated DC Date:     Anticipated DC Plan:        DC Planning Services  CM consult      Choice offered to / List presented to:     DME arranged  TUBE FEEDING      DME agency  [facility]        Status of service:  Completed, signed off Medicare Important Message given?   (If response is "NO", the following Medicare IM given date fields will be blank) Date Medicare IM given:   Date Additional Medicare IM given:    Discharge Disposition:  HOME/SELF CARE  Per UR Regulation:    If discussed at Long Length of Stay Meetings, dates discussed:    Comments:  Primary Care @ [facility]  Contact:  Montrail Mehrer, mother  [phone]  05-07-12 2pm Avie Arenas, RNBSN [phone] Physician called - plans to dc - mother wants to make sure equipment will be delivered.  Physician has not ordered but will do that now.  Notified [facility] of intention to dc today. Confirmed that feedings and supplies would be delivered today.  05/05/12 1041 Henrietta Mayo RN BSN MSN CCM Received referral from nutritionist stating pt's mother, who is primary CG, requested assistance changing tube feeding suppliers.  Talked with mother and obtained needed information, communicated same to [facility] who currently provides O2 and is her choice to provide tube feedings and supplies. 1103 Per [facility], they will be able to provide tube feedings and supplies.  Before pt is discharged, they will need orders for formula, bags, 4x4 drain sponges, 2 oz syringes, tape, and gloves. 1348 Per mother, current nutrition is  based on 20 lb wt loss - she now is questioning accuracy of bed weight as it is now registering 30 lb more since he is on different bed. TC to nutritioninst to inform.

## 2012-05-09 ENCOUNTER — Other Ambulatory Visit: Payer: Self-pay | Admitting: Family Medicine

## 2012-05-09 DIAGNOSIS — Z934 Other artificial openings of gastrointestinal tract status: Secondary | ICD-10-CM

## 2012-05-10 LAB — CULTURE, BLOOD (ROUTINE X 2)
Culture: NO GROWTH
Culture: NO GROWTH

## 2012-05-10 NOTE — Discharge Summary (Signed)
Patient seen and following during inpatient stay in conjunction with attending Dr. Janit Pagan.  Paula Compton, MD

## 2012-05-12 ENCOUNTER — Telehealth: Payer: Self-pay | Admitting: *Deleted

## 2012-05-12 ENCOUNTER — Emergency Department (HOSPITAL_COMMUNITY)
Admission: EM | Admit: 2012-05-12 | Discharge: 2012-05-12 | Discharge status: Against Medical Advice | Payer: BC Managed Care – PPO | Attending: Emergency Medicine | Admitting: Emergency Medicine

## 2012-05-12 ENCOUNTER — Encounter (HOSPITAL_COMMUNITY): Payer: Self-pay

## 2012-05-12 ENCOUNTER — Ambulatory Visit (HOSPITAL_COMMUNITY)
Admission: RE | Admit: 2012-05-12 | Discharge: 2012-05-12 | Disposition: A | Discharge status: Home / Self Care | Payer: BC Managed Care – PPO | Source: Ambulatory Visit | Attending: Family Medicine | Admitting: Family Medicine

## 2012-05-12 ENCOUNTER — Other Ambulatory Visit: Payer: Self-pay | Admitting: Family Medicine

## 2012-05-12 ENCOUNTER — Telehealth: Payer: Self-pay | Admitting: Family Medicine

## 2012-05-12 DIAGNOSIS — T85598D Other mechanical complication of other gastrointestinal prosthetic devices, implants and grafts, subsequent encounter: Secondary | ICD-10-CM

## 2012-05-12 DIAGNOSIS — R109 Unspecified abdominal pain: Secondary | ICD-10-CM | POA: Insufficient documentation

## 2012-05-12 DIAGNOSIS — G809 Cerebral palsy, unspecified: Secondary | ICD-10-CM | POA: Insufficient documentation

## 2012-05-12 DIAGNOSIS — Z434 Encounter for attention to other artificial openings of digestive tract: Secondary | ICD-10-CM | POA: Insufficient documentation

## 2012-05-12 MED ORDER — IOHEXOL 300 MG/ML  SOLN
20.0000 mL | Freq: Once | INTRAMUSCULAR | Status: AC | PRN
  Administered 2012-05-12: 20 mL via INTRATHECAL

## 2012-05-12 NOTE — ED Notes (Signed)
Pt. 's feeding j-tube Became clogged today and was just placed last Thursday.  Pt. Is also on an antibiotic for a infection around the tube .  The redness around the tube decreased e but  Mom feels it is becoming hard again. [name] sent pt. To [place].

## 2012-05-12 NOTE — Telephone Encounter (Signed)
Mother calls to report that J Tube is clogged and she is unable to get it unclogged.  Patient gets continuous feedings and meds through tube.  Abdomen is hard and seems bloated. Skin around tube is red but not as previously when he  was  admitted to hospital.  Consulted with Dr. Gwendolyn Grant and he advises that patient should be taken to ED for evaluation . Mother voices understanding.

## 2012-05-12 NOTE — Telephone Encounter (Addendum)
Family Medicine Progress Note:   S: Patient recently discharged from cellulitis around j-tube which improved with replacement and antbiotics. Called by ED nurse about decision to send patient to IR vs. Having ED visit. I spoke with the mother  With aid of ED nurse.  J-tube became difficult to flush earlier today and when mother tried to flush it at noon was unable to do so. Mom thinks overall that redness has continued to improve around the site. He has been getting clindamycin everyday and will complete Rx on 05/13/12. Mom thinks it is slightly firm around the j-tube site but has not noted any pus. He has been Afebrile at home. Patient has not acted differently or like he was in pain which mother is very perceptive of. She does think he has some mild abdominal bloating. Last BM 5 days ago but had miralax in hospital and had large BM just before discharge.    Had been told by our clinic to come to ED after mom unable to reach IR despite several attempts in order to discuss replacement.  Spoke with IR who said if order placed, they could replace J-tube. Order placed and i saw patient in waiting room for radiology.   O: HR: 94 on my exam. Temp 97.3 in ED. BP 113/71 in ED.  Gen: NAD, resting comfortably in chair with eyes open HEENT: MMM Abd: soft/mildly distended. J-tube site with slight surrounding erythema (appears improved from discharge exam-discharge exam noted some induration persisted). I feel slight induration. J-tube bulb does seem to be bulging out slightly. Normal bowel sounds.   MSK: contracted, no rhytmic movements   A/P:   Clogged J-tube This is the second time that the j-tube water balloon has either come out or threatened to come out since switching feeds to ones with fiber. Discussed with Mom and she never had this issue with the old j-tube feeds. We made the decision to return to the old feeds.   IR to evaluate and potentially replace. If any abnormalities during replacement,  patient to return to ED for further eval and labwork.   History of cellulitis-mother to continue antibiotic if j-tube can be replaced and functions  Patient has follow up appt in clinic tomorrow fortunately.   Tana Conch, MD, PGY2 05/12/2012 3:14 PM

## 2012-05-12 NOTE — Procedures (Signed)
Interventional Radiology Procedure Note  Procedure: Existing tube looped in stomach and crimped.  Exchanged for a new 47F tube under fluoro.  Tube tip in the proximal jejunum.  Complications: None Recommendations: - Routine tube care  Signed,  Sterling Big, MD Vascular & Interventional Radiologist [facility]

## 2012-05-12 NOTE — Telephone Encounter (Signed)
Called to check on [name]. Mother reports that they said j-tube was "kinked" in stomach. It was replaced and is functioning properly. She said they had to cut about 10cm off the end of the tube to avoid the kinking which may not have been done previously. Area around j-tube is not as firm after replacement either.   I will leave decision on type of feeds up to PCP tomorrow.

## 2012-05-12 NOTE — ED Notes (Signed)
Mother called and spoke with family practice md and they are placing order for pt in interventional radiology for pt to get peg tube replaced. Radiology is aware of pt coming.

## 2012-05-13 ENCOUNTER — Encounter: Payer: Self-pay | Admitting: Family Medicine

## 2012-05-13 ENCOUNTER — Ambulatory Visit (INDEPENDENT_AMBULATORY_CARE_PROVIDER_SITE_OTHER): Payer: BC Managed Care – PPO | Admitting: Family Medicine

## 2012-05-13 VITALS — BP 106/72 | HR 100 | Temp 97.5°F

## 2012-05-13 DIAGNOSIS — R1310 Dysphagia, unspecified: Secondary | ICD-10-CM

## 2012-05-13 DIAGNOSIS — L0390 Cellulitis, unspecified: Secondary | ICD-10-CM

## 2012-05-13 DIAGNOSIS — E876 Hypokalemia: Secondary | ICD-10-CM

## 2012-05-13 DIAGNOSIS — D696 Thrombocytopenia, unspecified: Secondary | ICD-10-CM

## 2012-05-13 DIAGNOSIS — D649 Anemia, unspecified: Secondary | ICD-10-CM | POA: Insufficient documentation

## 2012-05-13 DIAGNOSIS — K219 Gastro-esophageal reflux disease without esophagitis: Secondary | ICD-10-CM

## 2012-05-13 DIAGNOSIS — J9610 Chronic respiratory failure, unspecified whether with hypoxia or hypercapnia: Secondary | ICD-10-CM

## 2012-05-13 LAB — CBC
HCT: 42 % (ref 39.0–52.0)
Hemoglobin: 14.2 g/dL (ref 13.0–17.0)
MCHC: 33.8 g/dL (ref 30.0–36.0)
MCV: 89.4 fL (ref 78.0–100.0)

## 2012-05-13 LAB — BASIC METABOLIC PANEL
CO2: 27 mEq/L (ref 19–32)
Calcium: 9.2 mg/dL (ref 8.4–10.5)
Potassium: 3.6 mEq/L (ref 3.5–5.3)
Sodium: 141 mEq/L (ref 135–145)

## 2012-05-13 NOTE — Progress Notes (Addendum)
  Subjective:    Patient ID: [name], male    DOB: 03/22/1990, 22 y.o.   MRN: [id number]  HPI # Hospital follow-up for cellulitis around J-tube Tube was kinked and he went to ED yesterday due to concern it was clogged. He is now back on his regular tube feeds.  He has seen [facility] in the past but mother has been unhappy with his care. She is requesting referral to a different provider for management of tube feeds.   She reports improvement in cellulitis around J-tube site even from yesterday  He is still on Clindamycin for cellulitis and potential puemonia  ROS: denies fevers, changes in behavior, difficulty breathing  # Anemic while in hospital. Thought due to volume repletion and dilution. It had normalized at time of discharge.  # Thrombocytopenic while in hospital. Though secondary to infection.   # Hypokalemia on 2 occasions (3-range) while in hospital and repleted.   Review of Systems Per HPI  Allergies, medication, past medical history reviewed.  Smoking status noted.     Objective:   Physical Exam GEN: NAD; asleep PULM: NI WOB, soft snoring, CTAB without w/r/r with normal respirations SKIN: ABDOMEN: granulation tissue around J-tube site without significant surrounding erythema; no drainage     Assessment & Plan:  UPDATE: Called mother and notified of normal lab results 03/21

## 2012-05-13 NOTE — Addendum Note (Signed)
Addended by: Jone Baseman D on: 05/13/2012 11:44 AM   Modules accepted: Orders

## 2012-05-13 NOTE — Assessment & Plan Note (Signed)
While in hospital. We will repeat K today.

## 2012-05-13 NOTE — Assessment & Plan Note (Deleted)
We will refer him to [facility] if possible for management of J-tube feeds. Mother has been displeased with care he was receiving at [facility] and wanted to change clinics.   

## 2012-05-13 NOTE — Assessment & Plan Note (Signed)
While in hospital. We will repeat CBC today.

## 2012-05-13 NOTE — Patient Instructions (Signed)
Continue Clindamycin  We will try to refer Imri to [facility] Please call me by Wednesday if you haven't heard from [place] regarding this appointment.  We will check WBC, hemoglobin, platelets, potassium today If your lab results are normal, I will send you a letter with the results. If abnormal, someone at the clinic will get in touch with you.

## 2012-05-13 NOTE — Assessment & Plan Note (Signed)
Around J-tube site is improved since hospitalization.

## 2012-05-13 NOTE — Assessment & Plan Note (Signed)
We will refer him to [place] if possible for management of J-tube feeds. Mother has been displeased with care he was receiving at [facility] and wanted to change clinics.

## 2012-05-13 NOTE — Assessment & Plan Note (Signed)
While in hospital. We will follow-up repeat CBC today.

## 2012-05-24 ENCOUNTER — Ambulatory Visit (HOSPITAL_COMMUNITY)
Admission: RE | Admit: 2012-05-24 | Discharge: 2012-05-24 | Disposition: A | Discharge status: Home / Self Care | Payer: BC Managed Care – PPO | Source: Ambulatory Visit | Attending: Interventional Radiology | Admitting: Interventional Radiology

## 2012-05-24 ENCOUNTER — Other Ambulatory Visit (HOSPITAL_COMMUNITY): Payer: Self-pay | Admitting: Interventional Radiology

## 2012-05-24 DIAGNOSIS — IMO0002 Reserved for concepts with insufficient information to code with codable children: Secondary | ICD-10-CM

## 2012-05-24 DIAGNOSIS — Z4659 Encounter for fitting and adjustment of other gastrointestinal appliance and device: Secondary | ICD-10-CM | POA: Insufficient documentation

## 2012-05-24 MED ORDER — IOHEXOL 300 MG/ML  SOLN
50.0000 mL | Freq: Once | INTRAMUSCULAR | Status: AC | PRN
  Administered 2012-05-24: 25 mL

## 2012-05-24 NOTE — Procedures (Signed)
Successful fluoroscopic guided replacement and upsize of 24 Fr gastrojejuonostomy tube.  No immediate post procedural complications.  The feeding tube is ready for immediate use.

## 2012-06-16 ENCOUNTER — Ambulatory Visit (INDEPENDENT_AMBULATORY_CARE_PROVIDER_SITE_OTHER): Payer: BC Managed Care – PPO | Admitting: Family Medicine

## 2012-06-16 ENCOUNTER — Encounter: Payer: Self-pay | Admitting: Family Medicine

## 2012-06-16 VITALS — BP 112/76 | Temp 97.4°F | Wt 97.0 lb

## 2012-06-16 DIAGNOSIS — L0390 Cellulitis, unspecified: Secondary | ICD-10-CM

## 2012-06-16 DIAGNOSIS — Z4659 Encounter for fitting and adjustment of other gastrointestinal appliance and device: Secondary | ICD-10-CM

## 2012-06-16 DIAGNOSIS — L0291 Cutaneous abscess, unspecified: Secondary | ICD-10-CM

## 2012-06-16 DIAGNOSIS — G809 Cerebral palsy, unspecified: Secondary | ICD-10-CM

## 2012-06-16 LAB — CBC WITH DIFFERENTIAL/PLATELET
Eosinophils Absolute: 0.3 10*3/uL (ref 0.0–0.7)
Eosinophils Relative: 3 % (ref 0–5)
Hemoglobin: 14.4 g/dL (ref 13.0–17.0)
Lymphs Abs: 3 10*3/uL (ref 0.7–4.0)
MCH: 31 pg (ref 26.0–34.0)
MCV: 91.8 fL (ref 78.0–100.0)
Monocytes Relative: 14 % — ABNORMAL HIGH (ref 3–12)
Platelets: 181 10*3/uL (ref 150–400)
RBC: 4.65 MIL/uL (ref 4.22–5.81)

## 2012-06-16 MED ORDER — CLINDAMYCIN PALMITATE HCL 75 MG/5ML PO SOLR: 300.0000 mg | Freq: Three times a day (TID) | ORAL | Status: AC

## 2012-06-16 NOTE — Assessment & Plan Note (Signed)
Patient care plan discussed with Dr. Madolyn Frieze.  We will refer to IR and [facility] for management on G-tube.

## 2012-06-16 NOTE — Progress Notes (Signed)
  Subjective:    Patient ID: [name], male    DOB: 19-Apr-1990, 22 y.o.   MRN: [id number]  HPI  Mom brings [name] in for erythema at his g-tube site.  He was admitted last month with an infection and required IV antibiotics so she was worried and wanted to catch it early.  He had not had a fever.  She says that she is still hoping to be able to find a specialist to help manage the g-tube.  She says that she went to [facility] and they did not seem to know anything about them, then Dr. Madolyn Frieze tried to refer them to [facility] but they do not manage G-tubes.   She also mentions [name] has had a cough and nasal congestion for the past few days.  Again, no fever, and he is non-verbal so difficult to ascertain symptoms.   Review of Systems See HPI    Objective:   Physical Exam BP 112/76  Temp(Src) 97.4 F (36.3 C) (Axillary)  Wt 97 lb (43.999 kg)  BMI 17.74 kg/m2 General appearance: No distress Nose: +nasal congestion Throat: oral mucosa moist, no lesions Lungs: clear to auscultation bilaterally Heart: regular rate and rhythm, S1, S2 normal, no murmur, click, rub or gallop Abdomen: skin at g-tube with mild erythema and induration.  Abdomen soft Pulses: 2+ and symmetric      Assessment & Plan:

## 2012-06-16 NOTE — Addendum Note (Signed)
Addended by: Damita Lack on: 06/16/2012 03:51 PM   Modules accepted: Orders

## 2012-06-16 NOTE — Assessment & Plan Note (Signed)
Around G-tube site, Rx for clindamycin, f/u with [place] in 1- week.

## 2012-06-16 NOTE — Patient Instructions (Addendum)
It was nice to meet you.  I have sent a prescription to your pharmacy for clindamycin. Please follow up with Dr. Madolyn Frieze in 1-2 weeks to re-check your skin.

## 2012-06-17 NOTE — Addendum Note (Signed)
Addended by: Damita Lack on: 06/17/2012 10:53 AM   Modules accepted: Orders

## 2012-06-18 ENCOUNTER — Ambulatory Visit (HOSPITAL_COMMUNITY)
Admission: RE | Admit: 2012-06-18 | Discharge: 2012-06-18 | Disposition: A | Discharge status: Home / Self Care | Payer: BC Managed Care – PPO | Source: Ambulatory Visit | Attending: Interventional Radiology | Admitting: Interventional Radiology

## 2012-06-18 ENCOUNTER — Encounter: Payer: Self-pay | Admitting: Family Medicine

## 2012-06-18 ENCOUNTER — Ambulatory Visit (INDEPENDENT_AMBULATORY_CARE_PROVIDER_SITE_OTHER): Payer: BC Managed Care – PPO | Admitting: Family Medicine

## 2012-06-18 ENCOUNTER — Other Ambulatory Visit (HOSPITAL_COMMUNITY): Payer: Self-pay | Admitting: Interventional Radiology

## 2012-06-18 ENCOUNTER — Telehealth: Payer: Self-pay | Admitting: *Deleted

## 2012-06-18 DIAGNOSIS — L0390 Cellulitis, unspecified: Secondary | ICD-10-CM

## 2012-06-18 DIAGNOSIS — L0291 Cutaneous abscess, unspecified: Secondary | ICD-10-CM

## 2012-06-18 DIAGNOSIS — Z4659 Encounter for fitting and adjustment of other gastrointestinal appliance and device: Secondary | ICD-10-CM

## 2012-06-18 DIAGNOSIS — G809 Cerebral palsy, unspecified: Secondary | ICD-10-CM

## 2012-06-18 DIAGNOSIS — Z431 Encounter for attention to gastrostomy: Secondary | ICD-10-CM | POA: Insufficient documentation

## 2012-06-18 DIAGNOSIS — K9429 Other complications of gastrostomy: Secondary | ICD-10-CM | POA: Insufficient documentation

## 2012-06-18 DIAGNOSIS — Y833 Surgical operation with formation of external stoma as the cause of abnormal reaction of the patient, or of later complication, without mention of misadventure at the time of the procedure: Secondary | ICD-10-CM | POA: Insufficient documentation

## 2012-06-18 LAB — CBC WITH DIFFERENTIAL/PLATELET
Basophils Absolute: 0 10*3/uL (ref 0.0–0.1)
Basophils Relative: 0 % (ref 0–1)
Eosinophils Absolute: 0.2 10*3/uL (ref 0.0–0.7)
Eosinophils Relative: 3 % (ref 0–5)
HCT: 44.4 % (ref 39.0–52.0)
MCH: 30.2 pg (ref 26.0–34.0)
MCHC: 33.8 g/dL (ref 30.0–36.0)
MCV: 89.5 fL (ref 78.0–100.0)
Monocytes Absolute: 0.7 10*3/uL (ref 0.1–1.0)
RDW: 14.3 % (ref 11.5–15.5)

## 2012-06-18 MED ORDER — IOHEXOL 300 MG/ML  SOLN
50.0000 mL | Freq: Once | INTRAMUSCULAR | Status: AC | PRN
  Administered 2012-06-18: 15 mL via INTRAVENOUS

## 2012-06-18 NOTE — Patient Instructions (Signed)
I think the cellulitis is stable (alternatively-the pressure from the balloon coming out over the last few days may have just made things appear more inflamed). I am going to check a white count of his cells to be sure. I will call you if it has elevated to discuss changing antibiotics vs other options. I am also checking a lipase to evaluate his pancreas since he had issues with that previously.   I would still follow up with Dr. Madolyn Frieze, Dr. Lula Olszewski or me next week,  Dr. Durene Cal  Health Maintenance Due-you may want to talk to Dr. Madolyn Frieze about his tetanus shot at some point  Topic Date Due  . Tetanus/tdap  08/24/2008  . Influenza Vaccine  10/25/2012

## 2012-06-18 NOTE — Telephone Encounter (Signed)
Mother calling because patient was seen on Wednesday by Dr. Lula Olszewski for "cellulitis around his feeding tube."  Was started on an abx.  Mother reports "swelling, bulging, redness" around the tube and diameter is bigger today than when patient was here on Wednesday.  States that opening was the size of a straw, but now opening looks the size of a water bottle opening.  Mother requesting appt this afternoon for recheck because she is afraid feeding tube may fall out.  Appt scheduled for today with Dr. Durene Cal ([facility]) at 2:00 pm.  Gaylene Brooks, RN

## 2012-06-18 NOTE — Assessment & Plan Note (Signed)
Appears stable. Afebrile, not tachycardic or tachypnic. Believe cellulitis looked worsened due to pressure from g-tube balloon exiting site earlier today.   Will check CBC and consider change to doxycycline if mildly elevated. Will also check lipase given history of pancreatitis but doubt this will be elevated. Ordered as stat and I will check on these labs this evening and in the AM.

## 2012-06-18 NOTE — Telephone Encounter (Signed)
Mother calling back because feeding tube has come out.  Has an appt today 2:00 pm at [facility] interventional radiology.  Mother thinks patient may need to be admitted because of a similar episode patient had.  Spoke with Herbert Seta at [facility] and patient will still need to be seen here for "cellulitis" because all patient is having done is feeding tube replacement.  Will move patient's appt to after 3:00 pm---patient to come after his appt with IR.  Mother informed.   Gaylene Brooks, RN

## 2012-06-18 NOTE — Progress Notes (Signed)
Subjective:   1. Cellulitis follow up. Patient seen on 4/23 by Dr. Lula Olszewski and started on clindamycin for a cellulitis around g-tube site. Patient had been hospitalized over a month ago with a similar infection which resulted in sepsis so mother wanted to catch things early this time.   Earlier today, mother became concerned because g-tube site seemed to get firmer, redder, and open up more. Eventually the balloon from his g-tube popped out. She went to IR today and states the theory was that the cap outside the skin was getting continually pressed down on the skin and over time was helping the balloon work its way out. She states after the g-tube was replaced and placed deeper, the redness and opening size have both improved and look similar to previous days. Mom denies fevers or vomiting. She states he has been moaning some more than normal over last 2 days and that seems to be better since replacement of g-tube earlier this afternoon. She does express concern over g-tube coming out twice over last month and expresses he had a bout of pancreatitis 10 years ago with similar presentation. No increased work of breathing or breathing rate. No hypoxia noted at home. No pus or discharge from g-tube site.   ROS--See HPI  Past Medical History-mental retardation, congenital CMV, cerebral palsy, history seizures Reviewed problem list.  Medications- reviewed and updated Chief complaint-noted  Objective: BP 108/67  Pulse 74  Temp(Src) 97.5 F (36.4 C) (Axillary) Gen: NAD, reclined in wheelchair, occasional moans HEENT: some nasal congestion persists CV: RRR no murmurs rubs or gallops Lungs: CTAB no crackles, wheeze, rhonchi anteriorly Abd: soft/nontender/nondistended. Normal bowel sounds.  Skin: around site of g-tube, small 1cm ring of redness with a slight 1-2 cm streak out to left side of tube. Appears stable from previous exam (per mother). No induration noted. Slight leakage of gastric contents  (mom encouraged to allow there to be some drainage and to avoid tightly placing cap down) Neuro: grossly normal, moves all extremities  Assessment/Plan:  >50% of a 35 minute office visit was spent on counseling and coordination of care. I discussed case with on call resident Dr. Louanne Belton and made him aware in case patient worsened and needed admission. Also discussed with moms reasons for return (sustained tachycardia, new fever, worsened redness or her experience based off of his actions)

## 2012-06-25 ENCOUNTER — Encounter: Payer: Self-pay | Admitting: Family Medicine

## 2012-06-25 ENCOUNTER — Ambulatory Visit (INDEPENDENT_AMBULATORY_CARE_PROVIDER_SITE_OTHER): Payer: BC Managed Care – PPO | Admitting: Family Medicine

## 2012-06-25 VITALS — BP 115/81 | HR 89 | Temp 97.5°F

## 2012-06-25 DIAGNOSIS — L0390 Cellulitis, unspecified: Secondary | ICD-10-CM

## 2012-06-25 DIAGNOSIS — R21 Rash and other nonspecific skin eruption: Secondary | ICD-10-CM

## 2012-06-25 NOTE — Patient Instructions (Signed)
Use vaseline liberally on site. Follow up with [place] as needed and see wound care next week. If he develops fevers, chills, worsening redness or erythema around site, please give [place] a call or make an appointment to be seen.   Health Maintenance Due  Topic Date Due  . Tetanus/tdap  08/24/2008  . Influenza Vaccine  10/25/2012

## 2012-06-25 NOTE — Assessment & Plan Note (Signed)
Will refer to wound care center per mother's preference. They will discuss getting patient a new gel pad overlay at that time (previously provided for patient) as more indentation due to not being able to turn patient with increased gastric contents.   Believe rash from chemical irritation from stomach acid. Advised liberal use of vaseline multiple times a day. Follow up with wound care within next week and [place] prn.   Also mother is to have [facility] to her house early next week and she is hoping to find a a new GI doctor at [facility] that will be more hands on with care of gj-tube than previous MD.

## 2012-06-25 NOTE — Progress Notes (Signed)
Subjective:   # Cellulitis follow up  Patient seen on 4/23 by Dr. Lula Olszewski and started on clindamycin for a cellulitis around g-tube site. Patient had been hospitalized over a month ago with a similar infection which resulted in sepsis so mother wanted to catch things early this time. I saw patient on 3/25 after cellulitis appeared to worsen. Mom had gj-tube replaced earlier that day after the balloon expelled itself. Once balloon was replaced (placed deeper and parents instructed not to push cap near skin), intensity of erythema improved and patient appeared more comfortable. Antibiotics were continued. WBC not elevated at that time and lipase not elevated (history of pancreatitis) and patient was afebrile.   Today, patient returns for follow up. Erythema and warmth directly around site have improved. No fevers. Decreased moaning until a day or two ago. No increased work of breathing or breathing rate. No hypoxia noted at home. No pus or discharge from g-tube site.   # Rash Unfortunately, as the cap to the gj-tube has been not placed closer to the skin and the fact that the gj tube was expelled and skin ripped, patient has had excess stomach acid coming from site (an intended consequence) patient has had related skin irritation with acid drainage down to his side and down to his groin (not moving towards penis currently). No fever/chills. Seemed to be more irritating. Mom has been using stoma hesive and all care wipes barrier wipes.   ROS--See HPI  Past Medical History-mental retardation, congenital CMV, cerebral palsy, history seizures Reviewed problem list.  Medications- reviewed and updated Chief complaint-noted  Objective: BP 115/81  Pulse 89  Temp(Src) 97.5 F (36.4 C) (Axillary) Gen: NAD, reclined in wheelchair, no moaning during visit CV: RRR no murmurs rubs or gallops Lungs: CTAB no crackles, wheeze, rhonchi anteriorly Abd: soft/nontender/nondistended. Normal bowel sounds.  Skin:  around site of g-tube, no warmth felt around gj-tube. Light erythema extending down side and down into groin. No warmth. Patient does not react to palpation of area. No induration noted. Less gastric content noted coming from around site than previous.  Neuro: MRCP, wheelchair bound, nonverbal  Assessment/Plan:

## 2012-06-25 NOTE — Assessment & Plan Note (Signed)
Improved/resolving. Almost done with course of clindamycin.

## 2012-06-28 ENCOUNTER — Other Ambulatory Visit: Payer: Self-pay | Admitting: *Deleted

## 2012-06-28 MED ORDER — OMEPRAZOLE 2 MG/ML ORAL SUSPENSION: 20.0000 mg | Freq: Every day | ORAL | Status: DC

## 2012-07-09 ENCOUNTER — Telehealth: Payer: Self-pay | Admitting: Family Medicine

## 2012-07-09 NOTE — Telephone Encounter (Signed)
Mom is calling because his wound is looking much much better and she isn't sure if she needs to keep the appointment with wound care on Tuesday so she needs to speak with Dr. Durene Cal or [place] by the middle of the day Monday to be sure of whether she needs to keep that appointment.

## 2012-07-09 NOTE — Telephone Encounter (Signed)
Called pt's mom and advised to keep the appt with the wound center. She agreed. Lorenda Hatchet, Renato Battles Dr.[facility] (no need to call pt's mom)

## 2012-07-13 ENCOUNTER — Encounter (HOSPITAL_BASED_OUTPATIENT_CLINIC_OR_DEPARTMENT_OTHER): Payer: BC Managed Care – PPO | Attending: General Surgery

## 2012-07-13 DIAGNOSIS — G809 Cerebral palsy, unspecified: Secondary | ICD-10-CM | POA: Insufficient documentation

## 2012-07-13 DIAGNOSIS — Z79899 Other long term (current) drug therapy: Secondary | ICD-10-CM | POA: Insufficient documentation

## 2012-07-13 DIAGNOSIS — F79 Unspecified intellectual disabilities: Secondary | ICD-10-CM | POA: Insufficient documentation

## 2012-07-13 DIAGNOSIS — G4730 Sleep apnea, unspecified: Secondary | ICD-10-CM | POA: Insufficient documentation

## 2012-07-13 DIAGNOSIS — Y833 Surgical operation with formation of external stoma as the cause of abnormal reaction of the patient, or of later complication, without mention of misadventure at the time of the procedure: Secondary | ICD-10-CM | POA: Insufficient documentation

## 2012-07-13 DIAGNOSIS — K9429 Other complications of gastrostomy: Secondary | ICD-10-CM | POA: Insufficient documentation

## 2012-07-14 NOTE — H&P (Signed)
NAMERENDELL, THIVIERGE               ACCOUNT NO.:  [id number]  MEDICAL RECORD NO.:  [id number]  LOCATION:  FOOT                         FACILITY:  [facility]  PHYSICIAN:  Joanne Gavel, M.D.        DATE OF BIRTH:  07/16/1990  DATE OF ADMISSION:  07/13/2012 DATE OF DISCHARGE:                             HISTORY & PHYSICAL   CHIEF COMPLAINT:  Leakage around the G-tube.  HISTORY OF PRESENT ILLNESS:  This is a 22 year old male with severe mental retardation, cerebral palsy, cortical blindness, et Karie Soda.  He had a G-tube as an infant and this was placed into the gastric wall of the stomach.  NG tube threaded down into the jejunum.  This has been changed several times.  Recently, he has had several episodes of periwound cellulitis and the wound seems to be enlarging.  He had one hospitalization for treatment of infection and now has a larger G-tube in place.  PAST MEDICAL HISTORY:  Mental retardation, sleep apnea, cerebral palsy, cortical blindness, congenital cytomegalovirus seizure disorder, dysphagia, acute and chronic respiratory failure, history of pneumonia, anemia, thrombocytopenia, and hyperkalemia.  PAST SURGICAL HISTORY:  He has had several GJ-tube placements.  ALLERGIES:  Sulfa.  MEDICATIONS:  Baclofen, Depakote, gabapentin, Robinul, nystatin cream, omeprazole, and Proventil.  REVIEW OF SYSTEMS:  As above.  PHYSICAL EXAMINATION:  VITAL SIGNS:  Temperature is not measured I do not why, pulse 66, respirations 16, blood pressure 110/64. GENERAL APPEARANCE:  The patient is locked-in, unresponsive, and sleeping at present. ABDOMEN:  Examination wound around the stomach reveals a little bit of granulation tissue, which was cauterized with silver nitrate.  The wound is clear now and no sign of cellulitis.  IMPRESSION AND PLAN:  Chronic GJ-tube.  Discussed with the mother, he needs a gastroenterologist to help him with management of this and possibly a Development worker, international aid.  He will call  us p.r.n.     Joanne Gavel, M.D.    RA/MEDQ  D:  07/13/2012  T:  07/14/2012  Job:  [id number]

## 2012-07-20 ENCOUNTER — Other Ambulatory Visit: Payer: Self-pay | Admitting: Family

## 2012-07-20 DIAGNOSIS — G40319 Generalized idiopathic epilepsy and epileptic syndromes, intractable, without status epilepticus: Secondary | ICD-10-CM

## 2012-07-20 DIAGNOSIS — G808 Other cerebral palsy: Secondary | ICD-10-CM

## 2012-07-20 MED ORDER — BACLOFEN 10 MG PO TABS: ORAL_TABLET | ORAL | Status: DC

## 2012-07-20 MED ORDER — GABAPENTIN 300 MG PO CAPS: ORAL_CAPSULE | ORAL | Status: DC

## 2012-08-13 ENCOUNTER — Telehealth: Payer: Self-pay | Admitting: Emergency Medicine

## 2012-08-13 NOTE — Telephone Encounter (Signed)
Rec'd from [facility] forward [phone] Pages to Dr.Byrum

## 2012-08-18 DIAGNOSIS — Z79899 Other long term (current) drug therapy: Secondary | ICD-10-CM | POA: Insufficient documentation

## 2012-08-18 DIAGNOSIS — G40319 Generalized idiopathic epilepsy and epileptic syndromes, intractable, without status epilepticus: Secondary | ICD-10-CM | POA: Insufficient documentation

## 2012-08-18 DIAGNOSIS — G808 Other cerebral palsy: Secondary | ICD-10-CM | POA: Insufficient documentation

## 2012-08-18 DIAGNOSIS — F73 Profound intellectual disabilities: Secondary | ICD-10-CM | POA: Insufficient documentation

## 2012-08-20 ENCOUNTER — Other Ambulatory Visit (HOSPITAL_COMMUNITY): Payer: Self-pay | Admitting: Interventional Radiology

## 2012-08-20 ENCOUNTER — Ambulatory Visit (HOSPITAL_COMMUNITY)
Admission: RE | Admit: 2012-08-20 | Discharge: 2012-08-20 | Disposition: A | Discharge status: Home / Self Care | Payer: BC Managed Care – PPO | Source: Ambulatory Visit | Attending: Interventional Radiology | Admitting: Interventional Radiology

## 2012-08-20 DIAGNOSIS — K9413 Enterostomy malfunction: Secondary | ICD-10-CM | POA: Insufficient documentation

## 2012-08-20 DIAGNOSIS — G809 Cerebral palsy, unspecified: Secondary | ICD-10-CM

## 2012-08-20 DIAGNOSIS — K9403 Colostomy malfunction: Secondary | ICD-10-CM | POA: Insufficient documentation

## 2012-08-20 MED ORDER — IOHEXOL 300 MG/ML  SOLN
50.0000 mL | Freq: Once | INTRAMUSCULAR | Status: AC | PRN
  Administered 2012-08-20: 15 mL via INTRAVENOUS

## 2012-08-20 NOTE — Procedures (Signed)
Interventional Radiology Procedure Note  Procedure: Exchange for a new 66F percutaneous J tube.  Tip in the proximal small bowel and ready for use.  Complications: None Recommendations: - Routine tube care  Signed,  Sterling Big, MD Vascular & Interventional Radiologist [facility]iology

## 2012-08-23 ENCOUNTER — Ambulatory Visit (INDEPENDENT_AMBULATORY_CARE_PROVIDER_SITE_OTHER): Payer: BC Managed Care – PPO | Admitting: Family Medicine

## 2012-08-23 ENCOUNTER — Telehealth: Payer: Self-pay | Admitting: Family Medicine

## 2012-08-23 VITALS — BP 105/76 | HR 94 | Temp 96.6°F

## 2012-08-23 DIAGNOSIS — L8990 Pressure ulcer of unspecified site, unspecified stage: Secondary | ICD-10-CM

## 2012-08-23 DIAGNOSIS — L8991 Pressure ulcer of unspecified site, stage 1: Secondary | ICD-10-CM

## 2012-08-23 DIAGNOSIS — Z4659 Encounter for fitting and adjustment of other gastrointestinal appliance and device: Secondary | ICD-10-CM

## 2012-08-23 DIAGNOSIS — R21 Rash and other nonspecific skin eruption: Secondary | ICD-10-CM

## 2012-08-23 DIAGNOSIS — L259 Unspecified contact dermatitis, unspecified cause: Secondary | ICD-10-CM

## 2012-08-23 MED ORDER — PREDNISONE 20 MG PO TABS: ORAL_TABLET | ORAL | Status: DC

## 2012-08-23 NOTE — Telephone Encounter (Signed)
Error

## 2012-08-23 NOTE — Progress Notes (Signed)
  Subjective:    Patient ID: [name], male    DOB: Feb 11, 1991, 22 y.o.   MRN: [id number]  HPI  22 year old m with cerebral palsy, seizure disorder, and frequent respiratory infection  1. Right hand swelling - started on Saturday and started as welt and skin broke, now with several other welts and redness, only located on dorsum of hand and not palm; Mom denies any known trauma, contact irritant, or history of similar issue; no one in the family with similar rashes, except for an older brother who has poison ivy dermatitis, but he has minimal contact with the patient  2. G-tube drainage - Patient's mom noticing worsening drainage around G-tube for several week, discharge is noted to be thick and yellow, not associated with swelling, redness, or warmth of the area; the tube was actually changed last week and the provider at that time did not think it was infected; Patient does have recent history of cellulitis around G-tube in May 2014 that was treated with Clindamycin   3. Rash on buttocks - Located on right buttocks, first noticed this week, red, mom concerned for pressure ulcer vs ring worm; has a remote history of pressure sore but no ringworm    Review of Systems  Constitutional: Negative for fever, activity change and appetite change.  Respiratory: Positive for stridor.   Gastrointestinal: Negative for abdominal pain and abdominal distention.  Skin: Positive for rash.       Objective:   Physical Exam  BP 105/76  Pulse 94  Temp(Src) 96.6 F (35.9 C) (Axillary) Gen: spastic quadriplegia, in wheelchair, stable appearing, non distressed Lungs: normal work of breathing, clear to auscultation  Right hand:  Generalized edema does not extend proximal to wrist; large wheel on dorsum of hand, ruptured bullae on base of 4th digit and small tense vesicles with clear fluid between between digits 2 and 3, no involvement of palmar side  Abdomen: G tube in place on left side, thick yellow  discharge from stoma, no surrounding calor, rubor, or tumor Buttock: small stage 1 pressure ulcer on right buttock      Assessment & Plan:

## 2012-08-23 NOTE — Patient Instructions (Addendum)
Thank you for bringing in [place] today.   1. Hand swelling - I think that this is an allergic reaction like a bad poison ivy. We will treat with steroid for 12 days.   2. Drainage around g-tube: I do not think this needs treatment at this time, but please keep a close eye on it.   3. Right buttocks - This is a stage 1 pressure ulcer I believe. Please increase the padding in the area, and it should resolve.   Please follow up in 1 week or sooner if needed.   Sincerely,   Dr. Clinton Sawyer

## 2012-08-24 ENCOUNTER — Encounter: Payer: Self-pay | Admitting: Family Medicine

## 2012-08-24 DIAGNOSIS — R21 Rash and other nonspecific skin eruption: Secondary | ICD-10-CM | POA: Insufficient documentation

## 2012-08-24 DIAGNOSIS — L8992 Pressure ulcer of unspecified site, stage 2: Secondary | ICD-10-CM | POA: Insufficient documentation

## 2012-08-24 NOTE — Assessment & Plan Note (Signed)
Aside from copious drainage, no evidence of infection around G-tube site, will continue to monitor with a low threshold for starting anti-biotics given history sepsis

## 2012-08-24 NOTE — Assessment & Plan Note (Signed)
Located on right buttock. Mom encouraged to use more padding and barrier cream as needed. She will do this and follow up as needed.

## 2012-08-24 NOTE — Assessment & Plan Note (Signed)
This is more consistent in appearance with contact dermatitis than single insect bite. Also would consider bullous impetigo, but lacking honey crusted lesions. Will treat like severe contact dermatitis with prednisone taper of 40 mg x 4 day, 20 mg x 4 days, and 10 mg x 4 days. Return for follow up eval as needed.

## 2012-08-31 ENCOUNTER — Telehealth: Payer: Self-pay

## 2012-08-31 ENCOUNTER — Telehealth: Payer: Self-pay | Admitting: Internal Medicine

## 2012-08-31 NOTE — Telephone Encounter (Signed)
Very complicated patient with multisystem disease. I am not able to accommodate this patient. He should consider having his adult GI care at [facility].

## 2012-08-31 NOTE — Telephone Encounter (Signed)
Notified the patients mother she will come by and pick up records.

## 2012-08-31 NOTE — Telephone Encounter (Signed)
Rec'd from [facility] forward 183 pages to GI Historical Provider

## 2012-08-31 NOTE — Telephone Encounter (Signed)
I will not be able to accept.

## 2012-08-31 NOTE — Telephone Encounter (Signed)
Dr Russella Dar, you are doc of day 09/01/12. Would you like to accept patient? Records placed on your desk for review.

## 2012-08-31 NOTE — Telephone Encounter (Signed)
Dr Juanda Chance, please see extensive records in your inbox. Would you like to accept patient?

## 2012-08-31 NOTE — Telephone Encounter (Signed)
This patient is transferring records from [facility]. The mother is trying to find him an adult GI. Records need to be reviewed.

## 2012-09-13 ENCOUNTER — Telehealth: Payer: Self-pay | Admitting: Family Medicine

## 2012-09-13 NOTE — Telephone Encounter (Signed)
Mother is requesting a letter from Dr. Durene Cal stating that it is a medical necessity of the battery for [facility] lift. They have schedule an appointment for 8/4 to see the Doctor. JW

## 2012-09-13 NOTE — Telephone Encounter (Signed)
Spoke with pt's mother.  Needs a letter written stating patient's diagnosis and that they need an "Adult Battery for [employer]".  I will all pt's mother when letter is ready.  Gyneth Hubka, Darlyne Russian, CMA

## 2012-09-15 ENCOUNTER — Encounter: Payer: Self-pay | Admitting: Family Medicine

## 2012-09-15 NOTE — Telephone Encounter (Signed)
Letter written. [name] will send letter and inform mother.   Thank you Baxter Hire for your help.

## 2012-09-16 ENCOUNTER — Encounter: Payer: Self-pay | Admitting: Pediatrics

## 2012-09-16 ENCOUNTER — Telehealth: Payer: Self-pay | Admitting: *Deleted

## 2012-09-16 ENCOUNTER — Ambulatory Visit (INDEPENDENT_AMBULATORY_CARE_PROVIDER_SITE_OTHER): Payer: BC Managed Care – PPO | Admitting: Pediatrics

## 2012-09-16 VITALS — BP 90/70 | HR 72

## 2012-09-16 DIAGNOSIS — G40219 Localization-related (focal) (partial) symptomatic epilepsy and epileptic syndromes with complex partial seizures, intractable, without status epilepticus: Secondary | ICD-10-CM

## 2012-09-16 DIAGNOSIS — G40319 Generalized idiopathic epilepsy and epileptic syndromes, intractable, without status epilepticus: Secondary | ICD-10-CM

## 2012-09-16 DIAGNOSIS — F73 Profound intellectual disabilities: Secondary | ICD-10-CM

## 2012-09-16 DIAGNOSIS — G253 Myoclonus: Secondary | ICD-10-CM

## 2012-09-16 DIAGNOSIS — G4710 Hypersomnia, unspecified: Secondary | ICD-10-CM

## 2012-09-16 DIAGNOSIS — G808 Other cerebral palsy: Secondary | ICD-10-CM

## 2012-09-16 NOTE — Patient Instructions (Signed)
I will talk about [place] of with my [place] gastroenterology colleagues and see if we can get them to pick up his care.

## 2012-09-16 NOTE — Telephone Encounter (Signed)
Pt's mother, [name], notified of letter available at the front desk.  Kemisha Bonnette, Darlyne Russian, CMA

## 2012-09-16 NOTE — Telephone Encounter (Signed)
Letter printed and TC to mother ready for pick up.  Tayte Childers, Darlyne Russian, CMA

## 2012-09-16 NOTE — Progress Notes (Signed)
Patient: [name]. [place] MRN: [id number] Sex: male DOB: [birth year]  Provider: Deetta Perla, MD Location of Care: [facility]  Note type: Routine return visit  History of Present Illness: Referral Source: Dr. Doralee Albino History from: mother and [facility] chart Chief Complaint: Seizures  [name] B. Karasik is a 22 y.o. male who returns for evaluation and management of seizures, spastic quadriparesis, cortical blindness, profound cognitive impairment.  The patient returns on September 16, 2012 for the first time since November 28, 2011.  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  The patient has a variety of seizures.  These include generalized tonic-clonic, which are most infrequent, myoclonic which happen multiple times daily and complex partial, which also occur daily.  The patient has no significant postictal problems except for the generalized tonic-clonic events.  These are characterized by movement and jerking in both extremities, stiffening, eyes rolled up, facial grimace, saliva coming from his mouth, he has coughing, choking, perioral cyanosis, the episodes last for less than a minute.  The patient has severe dysphagia and has been fed through gastrostomy.  He was hospitalized in November 2013 for 20 days with probable aspiration pneumonia.  He was very seriously ill and they do not intubate and at one point do not resuscitate.  The patient is followed by pulmonary by Dr. Delton Coombes. Mother has requested that [facility] gastroenterology follow her son, but for reasons that I do not understand that was rejected.  His primary care physician is through [facility].  Radiology has placed his gastrostomy, which is actually a jejunostomy.  The tube comes out frequently and fortunately has been fairly  straightforward to reinsert.  The stoma at one point was quite big, but has become smaller.  Family practice is ordering his tube feeding.  Radiology is maintaining the tube.  Mother wants a gastroenterologist for complications arising from the tube or feeding, or if a decision to place a direct jejunostomy tube needs to be made.  The patient's medications at his table.  He takes Depakote Sprinkles and gabapentin for seizures.  He receives Diastat if he has a prolonged seizure, but that has not been necessary for some time.  Spasticity is treated to some extent with baclofen and sialorrhea is treated with glycopyrrolate.  He receives albuterol as needed for wheezing, propylene glycol for constipation and omeprazole for gastroesophageal reflux.  Review of Systems: 12 system review was remarkable for seizure  Past Medical History  Diagnosis Date  . Allergy   . Asthma   . Cerebral palsy, quadriplegic   . Congenital CMV infection   . Neuromuscular disorder     cp  . H/O hiatal hernia   . Pneumonia     "he's had it several times; maybe now" (12/17/2011)  . Community acquired pneumonia 06/30/2011    Psuedomonas PNA 06/2011 >tx 21 d of Cipro  CXR 08/15/11 >clearance of PNA    . GERD (gastroesophageal reflux disease)   . Seizures     "has had gran mals before" (12/17/2011)  . Cytomegalovirus   . On home oxygen therapy     "at night" (12/17/2011"  . Jejunostomy tube present   . Acute and chronic respiratory failure 12/24/2011   Hospitalizations: yes, Head Injury: no, Nervous System Infections: no, Immunizations up to date: yes Past Medical History Comments: Patient was hospitalized in Nov. 2013 due to pneumonia and April 2014  due to cellulitis around G tube .  Behavior History none  Surgical History Past Surgical History  Procedure Laterality Date  . Nissen fundoplication    . Rod  ~ 2003 "or before"    "in his back" (12/17/2011  . Peg tube placement    . Percutaneous gastrotomy tube  replacement     Family History family history includes Allergies in his mother; Colon cancer in his paternal grandmother; Diabetes in his father; and Heart disease in his maternal uncle. Family History is negative migraines, seizures, cognitive impairment, blindness, deafness, birth defects, chromosomal disorder, autism.  Social History History   Social History  . Marital Status: Single    Spouse Name: N/A    Number of Children: N/A  . Years of Education: N/A   Social History Main Topics  . Smoking status: Never Smoker   . Smokeless tobacco: Never Used  . Alcohol Use: No  . Drug Use: No  . Sexually Active: No   Other Topics Concern  . None   Social History Narrative  . None   Living with parents, younger brother and younger sister.   Current Outpatient Prescriptions on File Prior to Visit  Medication Sig Dispense Refill  . baclofen (LIORESAL) 10 MG tablet Take 2 tablets 3 times per day  180 each  5  . divalproex (DEPAKOTE SPRINKLE) 125 MG capsule Take 250-375 mg by mouth 3 (three) times daily. 375 mg in the morning, 250 mg midday, and 375 mg at bedtime      . Feeding Tubes - Pump (KANGAROO JOEY ENTERAL PUMP) MISC 30 Units by Does not apply route daily. Please provide bags for Kangroo Joey enteral feeding device. Should be changed daily.  30 each  12  . gabapentin (NEURONTIN) 300 MG capsule 1 tab per tube three times a day  90 capsule  5  . glycopyrrolate (ROBINUL) 2 MG tablet Take 2 mg by mouth 2 (two) times daily.      . Hydrocortisone (GERHARDT'S BUTT CREAM) CREA Apply 1 application topically 2 (two) times daily. Please provide remained of tube used in hospital  1 each  2  . Incontinence Supplies MISC May have incontinence supplies as determined by home health agency.  30 each  0  . Nutritional Supplements (FEEDING SUPPLEMENT, JEVITY 1.2 CAL,) LIQD Place 1,000 mLs into feeding tube continuous.      Marland Kitchen nystatin cream (MYCOSTATIN) Apply topically 2 (two) times daily as needed  (Redness).  30 g  1  . omeprazole (PRILOSEC) 2 mg/mL SUSP Take 10 mLs (20 mg total) by mouth daily at 12 noon.  150 mL  3  . clindamycin (CLEOCIN) 75 MG/5ML solution       . predniSONE (DELTASONE) 20 MG tablet Please give 40 mg (2 pills) per tube for 4 days, following by 20 mg (1 pill) for 4 days and 10 mg (1/2) pill for four days per tube.  20 tablet  0  . PROVENTIL HFA 108 (90 BASE) MCG/ACT inhaler USE 2 PUFFS EVERY 4 HOURS AS NEEDED FOR WHEEZE  1 Inhaler  0   No current facility-administered medications on file prior to visit.   The medication list was reviewed and reconciled. All changes or newly prescribed medications were explained.  A complete medication list was provided to the patient/caregiver.  Allergies  Allergen Reactions  . Sulfamethoxazole W-Trimethoprim Rash  . Topamax (Topiramate)    Physical Exam BP 90/70  Pulse 72  General:  Awake but not alert  thin,  spastic deformities of his limbs, in no acute distress, non-handed, Brown curly hair, blue eyes Head:  microcephalic, no dysmorphic features Ears, Nose and Throat: Otoscopic: tympanic membranes  can be seen on the  left, wax occludes the right.  Pharynx: oropharynx is pink without exudates or tonsillar hypertrophy. Neck: supple, full range of motion, no cranial or cervical bruits Respiratory: auscultation clear Cardiovascular: no murmurs, pulses are normal Musculoskeletal: he has severe contractures of his arms with clawhand deformities, flexion contractures of the elbows, shoulders, wrists, hips, knees, externally rotated feet with tight heel cords, repaired scoliosis.  I can extend the right leg completely.  There are flexion contractions in the left knee and both elbows right greater than left. Skin: no rashes or neurocutaneous lesions  Neurologic Exam  Mental Status: asleep throughout; he follows no commands.   He is mute Cranial Nerves: Cortical blindness with significant optic disc pallor, and minimally reactive  pupils, slowly roving eye movements, impassive face with lower facial weakness, inability to protrude his tongue, does not localize sound in either visual field, I did not test corneals or gag response. Motor: spastic quadriparesis with tone that appears to be greater on the left side than the right, decreased muscle mass on the left arm, arms and legs are flexed.  left arm can be nearly fully extended,  the right has a flexion contracture; legs can not able to be extended right worse than left;  feet are externally rotated;  tight heel cords. He has very limited spontaneous movements. Sensory: withdrawal x4 to the limits of his strength Coordination:  no tremor, unable to test Gait and Station: wheelchair-bound with difficulty with head control at times ( head flops forward) Reflexes: diminished bilaterally due to co-contraction;  sustained ankle clonus on the left, 2-3 beats of ankle clonus on the right; bilateral  extensor plantar responses.  Assessment 1. Generalized convulsive seizures intractable, but infrequent (345.11). 2. Generalized non-convulsive seizures that I think are partial complex (345.41). 3. 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 4. 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 5. 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  Plan I have no plans to change his medication.  I will see him in follow up in six months' time.  I spent 30 minutes of face-to-face time with the patient and his mother, more than half of it in consultation.  Deetta Perla MD

## 2012-09-25 ENCOUNTER — Other Ambulatory Visit: Payer: Self-pay | Admitting: Family Medicine

## 2012-09-27 ENCOUNTER — Ambulatory Visit: Payer: BC Managed Care – PPO | Admitting: Family Medicine

## 2012-09-27 ENCOUNTER — Other Ambulatory Visit: Payer: Self-pay

## 2012-09-27 MED ORDER — DEPAKOTE SPRINKLES 125 MG PO CPSP: ORAL_CAPSULE | ORAL | Status: DC

## 2012-10-21 ENCOUNTER — Other Ambulatory Visit: Payer: Self-pay | Admitting: *Deleted

## 2012-10-21 MED ORDER — OMEPRAZOLE 2 MG/ML ORAL SUSPENSION: 20.0000 mg | Freq: Every day | ORAL | Status: DC

## 2012-10-26 ENCOUNTER — Other Ambulatory Visit: Payer: Self-pay | Admitting: *Deleted

## 2012-10-26 MED ORDER — OMEPRAZOLE 2 MG/ML ORAL SUSPENSION: 20.0000 mg | Freq: Every day | ORAL | Status: DC

## 2012-10-27 ENCOUNTER — Other Ambulatory Visit: Payer: Self-pay | Admitting: *Deleted

## 2012-10-27 MED ORDER — OMEPRAZOLE 2 MG/ML ORAL SUSPENSION: 20.0000 mg | Freq: Every day | ORAL | Status: DC

## 2012-10-28 ENCOUNTER — Other Ambulatory Visit: Payer: Self-pay | Admitting: *Deleted

## 2012-10-29 MED ORDER — OMEPRAZOLE 2 MG/ML ORAL SUSPENSION: 20.0000 mg | Freq: Every day | ORAL | Status: DC

## 2012-12-01 ENCOUNTER — Ambulatory Visit: Payer: BC Managed Care – PPO

## 2012-12-02 ENCOUNTER — Telehealth: Payer: Self-pay | Admitting: Emergency Medicine

## 2012-12-02 NOTE — Telephone Encounter (Signed)
We will try to work in. I will look for a slot

## 2012-12-02 NOTE — Telephone Encounter (Signed)
Last OV 02-2012 with instructions to f/u in 6 months. Pt mother is calling now for appt. Nothing available in Oct or Nov. Please advise if ok to double? Carron Curie, CMA

## 2012-12-03 NOTE — Telephone Encounter (Signed)
Pt's mother returned call.  Set appt for 12/13/12 @ 10:15 am.  Pt's mother verbalized understanding & states nothing further needed at this time.  [name]

## 2012-12-03 NOTE — Telephone Encounter (Signed)
Pt can see RB on 12/13/12 am- LMTCB on both numbers provided for [facility]

## 2012-12-13 ENCOUNTER — Encounter: Payer: Self-pay | Admitting: Emergency Medicine

## 2012-12-13 ENCOUNTER — Ambulatory Visit (INDEPENDENT_AMBULATORY_CARE_PROVIDER_SITE_OTHER): Payer: BC Managed Care – PPO | Admitting: Emergency Medicine

## 2012-12-13 VITALS — BP 100/66 | HR 80

## 2012-12-13 DIAGNOSIS — G809 Cerebral palsy, unspecified: Secondary | ICD-10-CM

## 2012-12-13 NOTE — Assessment & Plan Note (Signed)
Continue current secretion management.

## 2012-12-13 NOTE — Progress Notes (Signed)
  Subjective:    Patient ID: [name], male    DOB: 05/12/1990, 22 y.o.   MRN: [id number]  HPI 22 yo man, hx of cerebral palsy. He is dependent for his care, lives at home with mom and dad. Had been followed for secretion management, recurrent PNA's by Dr Donnie Coffin at [facility]. Never required intubation. Followed by Dr Sharene Skeans with Neurology. He gets suctioned occasionally but not frequently (few times a month). Not currently using pulmicort nebs at this time. He is on albuterol nebs prn. Uses chest vest bid. Condition Complicated by impaired secretion management    Post Hospital 08/19/11 --  Admitted 5/6-5/14 for CAP -Psuedomonas. Tx w/ 21 days of Cipro.  Pulmonary consult to discuss possible trach placement , family declined w/ DNI now in place. Placed on O2 at night at discharge.  Seen by [facility] 6/21 -cxr follow up 6/21 w/ clearance of PNA  Does Vest Twice daily    ROV 11/04/11 -- Hx cerebral palsy, recurrent PNA's. Severe CAP as above, now recovered. He is being suctioned rarely. His family had conversations about code status, trach, etc during hospitalization. They do not want him ventilated or to receive CPR, but they MIGHT accept trach if it were for maintenance like suctioning etc.   ROV 01/15/12 -- Hx cerebral palsy, recurrent PNA's. He underwent a long admission, discharged on 11/11, for severe PNA. He is now back home. He is wearing O2 at night, still shows signs of OSA but very difficult to treat. She is doing chest vest 4x a day.  He has not been to adult daycare since d/c to home.   ROV 03/19/12 -- Hx cerebral palsy, aspiration, recurrent PNA's.  His father is with him today. He has been managing secretions well. His breathing will sound a bit louder with some secretions when he needs to have BM. They are doing chest PT 4x a day. Often will cough up phlegm w chest PT. Seems to be back to baseline. Using O2 for sleep, not at other times.   ROV 12/13/12 -- follow up visit for 22 yo man  with cerebral palsy, aspiration, recurrent PNA's. He is doing chest vest percussion 3-4x a day. The secretions are often suctioned but sometimes he clears completely. His skin, BM's, nutrition are all good. No PNA or abx since last time. He is using a barrier cream for diaper area  That is red, needs a refill of this.       Objective:   Physical Exam Filed Vitals:   12/13/12 1028  BP: 100/66  Pulse: 80   Gen: Debilitated man in wheelchair, non-communicative  ENT: No lesions,  mouth clear,  oropharynx clear, poor dentition, no postnasal drip, L eye lateral gaze  Neck: No stridor, UA appears to be clear, snoring respirations when he dozes  Lungs: No use of accessory muscles, few basilar crackles  Cardiovascular: RRR, heart sounds normal, no murmur or gallops, no peripheral edema  Abdomen: PEG in place.   Musculoskeletal: No deformities, no cyanosis or clubbing  Neuro: alert, non focal  Skin: Warm, no lesions or rashes     Assessment & Plan:  CEREBRAL PALSY Continue current secretion management.

## 2012-12-13 NOTE — Patient Instructions (Signed)
Please continue your current excellent pulmonary hygiene with chest vest and suctioning.  Check to see if we gave [name] the Pneumonia vaccine when he was hospitalized.  Follow with Dr Delton Coombes in 6-9 months or sooner if you have any problems.

## 2013-01-15 ENCOUNTER — Other Ambulatory Visit: Payer: Self-pay | Admitting: Family

## 2013-01-29 ENCOUNTER — Other Ambulatory Visit: Payer: Self-pay | Admitting: Family

## 2013-01-31 ENCOUNTER — Encounter: Payer: Self-pay | Admitting: Family Medicine

## 2013-01-31 ENCOUNTER — Ambulatory Visit (INDEPENDENT_AMBULATORY_CARE_PROVIDER_SITE_OTHER): Payer: BC Managed Care – PPO | Admitting: Family Medicine

## 2013-01-31 VITALS — BP 118/60 | HR 78 | Temp 97.5°F | Wt 95.0 lb

## 2013-01-31 DIAGNOSIS — L219 Seborrheic dermatitis, unspecified: Secondary | ICD-10-CM

## 2013-01-31 DIAGNOSIS — L8990 Pressure ulcer of unspecified site, unspecified stage: Secondary | ICD-10-CM

## 2013-01-31 DIAGNOSIS — L8991 Pressure ulcer of unspecified site, stage 1: Secondary | ICD-10-CM

## 2013-01-31 NOTE — Patient Instructions (Addendum)
It was good to see you today.  Continue using wound dressings. Continue rotating him regularly on bed and keeping head of bed at 30 degrees to help offload pressure. I have ordered Wound Care. If you do not hear about an appointment, please call us back later this week.  For his skin issues on his face, this appears to be seborrheic dermatitis. Use selsun blue (or equivalent generic) on his face regularly. Follow up if no improvement.  You can try vaseline or cetaphil cream on his hands.  If he develops fevers or signs of much worsening skin, seek immediate care.  Seborrheic Dermatitis Seborrheic dermatitis involves pink or red skin with greasy, flaky scales. This is often found on the scalp, eyebrows, nose, bearded area, and on or behind the ears. It can also occur on the central chest. It often occurs where there are more oil (sebaceous) glands. This condition is also known as dandruff. When this condition affects a baby's scalp, it is called cradle cap. It may come and go for no known reason. It can occur at any time of life from infancy to old age. CAUSES  The cause is unknown. It is not the result of too little moisture or too much oil. In some people, seborrheic dermatitis flare-ups seem to be triggered by stress. It also commonly occurs in people with certain diseases such as Parkinson's disease or HIV/AIDS. SYMPTOMS   Thick scales on the scalp.  Redness on the face or in the armpits.  The skin may seem oily or dry, but moisturizers do not help.  In infants, seborrheic dermatitis appears as scaly redness that does not seem to bother the baby. In some babies, it affects only the scalp. In others, it also affects the neck creases, armpits, groin, or behind the ears.  In adults and adolescents, seborrheic dermatitis may affect only the scalp. It may look patchy or spread out, with areas of redness and flaking. Other areas commonly affected  include:  Eyebrows.  Eyelids.  Forehead.  Skin behind the ears.  Outer ears.  Chest.  Armpits.  Nose creases.  Skin creases under the breasts.  Skin between the buttocks.  Groin.  Some adults and adolescents feel itching or burning in the affected areas. DIAGNOSIS  Your caregiver can usually tell what the problem is by doing a physical exam. TREATMENT   Cortisone (steroid) ointments, creams, and lotions can help decrease inflammation.  Babies can be treated with baby oil to soften the scales, then they may be washed with baby shampoo. If this does not help, a prescription topical steroid medicine may work.  Adults can use medicated shampoos.  Your caregiver may prescribe corticosteroid cream and shampoo containing an antifungal or yeast medicine (ketoconazole). Hydrocortisone or anti-yeast cream can be rubbed directly onto seborrheic dermatitis patches. Yeast does not cause seborrheic dermatitis, but it seems to add to the problem. In infants, seborrheic dermatitis is often worst during the first year of life. It tends to disappear on its own as the child grows. However, it may return during the teenage years. In adults and adolescents, seborrheic dermatitis tends to be a long-lasting condition that comes and goes over many years. HOME CARE INSTRUCTIONS   Use prescribed medicines as directed.  In infants, do not aggressively remove the scales or flakes on the scalp with a comb or by other means. This may lead to hair loss. SEEK MEDICAL CARE IF:   The problem does not improve from the medicated shampoos, lotions,  or other medicines given by your caregiver.  You have any other questions or concerns. Document Released: 02/10/2005 Document Revised: 08/12/2011 Document Reviewed: 07/02/2009 [facility] Patient Information 2014 [place], [place].

## 2013-01-31 NOTE — Progress Notes (Signed)
Patient ID: Brennyn B. Sparks, male   DOB: October 19, 1990, 22 y.o.   MRN: [id number] Subjective:   CC: Pressure ulcer  HPI:   1. Pressure ulcer - Mom presents with Mae to follow up on right buttock ulcer. Ulcer at last visit 07/2012 was stage I and mom was encouraged to use more padding and prn barrier cream. She has been compliant with this and frequent repositioning, and reports >1 week of increased breakdown. She has used barrier cream for the increased redness, however it still broke open and intermittently has been bleeding so she used foam patch. This seems to make it better but with each BM, it breaks down again. She saw a new open area last night with skin breakdown but no bleeding. The total erythematous area is about 4-5 cm in diameter. Denies fevers or chills.  2. Flaking skin - Pt has h/o seborrheic dermatitis and they have Selsun Blue at home but mom does not use it much. Currently, he is in a flare and all of his hair-covered skin is red and flaking.    Review of Systems - Per HPI.   PMH: Reviewed  Tobacco:  Pt is a nonsmoker    Objective:  Physical Exam BP 118/60  Pulse 78  Temp(Src) 97.5 F (36.4 C) (Axillary)  Wt 95 lb (43.092 kg) GEN: NAD, occasional moaning quietly, very small and contractured HEENT: Atraumatic, normocephalic, sclera clear  PULM: Normal effort SKIN: Dry flaking areas under eyebrows and beard and scalp with erythematous base; right buttock with 4-5cm erythamtous soft area with 3 small 5mm areas peeling open; no pustules, fluctuance, or purulence EXTR: Contractured hands and legs, thin. NEURO: Significantly cognitively impaired, contractured, occasional moans    Assessment:     Adden B. Noguez is a 22 y.o. male here for follow up of ulcer.    Plan:     # See problem list and after visit summary for problem-specific plans.  # Health Maintenance: Not discussed.  Follow-up: Follow up in 2 weeks if no improvement in seborrheic  dermatitis. Otherwise, follow up with PCP as regularly scheduled.  Leona Singleton, MD [facility]

## 2013-02-04 ENCOUNTER — Encounter: Payer: Self-pay | Admitting: Family Medicine

## 2013-02-04 DIAGNOSIS — L219 Seborrheic dermatitis, unspecified: Secondary | ICD-10-CM | POA: Insufficient documentation

## 2013-02-04 NOTE — Assessment & Plan Note (Signed)
Chronic; in a flare on scalp, under eyebrows, beard, erythematous and flaking. - Use selsun blue (or generic) regularly. - Try coal tar if no improvement. - F/u in 2 weeks if still no improvement, and we could try an antifungal cream.

## 2013-02-04 NOTE — Assessment & Plan Note (Signed)
Stage II with three small broken areas of skin. No systemic symptoms.  - Referral placed to wound care. Appt obtained 12/24. - Mom is using 4x4in extra thin hydrocolloid dressings (provided one from clinic) - Continue keeping as clean as possible and rotating in bed regularly, keeping head of bed 30 deg to help offload pressure. - Referral placed to wound care. - Return precautions reviewed.

## 2013-02-08 ENCOUNTER — Ambulatory Visit (INDEPENDENT_AMBULATORY_CARE_PROVIDER_SITE_OTHER): Payer: BC Managed Care – PPO | Admitting: Pediatrics

## 2013-02-08 ENCOUNTER — Encounter: Payer: Self-pay | Admitting: Pediatrics

## 2013-02-08 VITALS — BP 84/64 | HR 84

## 2013-02-08 DIAGNOSIS — G253 Myoclonus: Secondary | ICD-10-CM

## 2013-02-08 DIAGNOSIS — G40219 Localization-related (focal) (partial) symptomatic epilepsy and epileptic syndromes with complex partial seizures, intractable, without status epilepticus: Secondary | ICD-10-CM

## 2013-02-08 DIAGNOSIS — K117 Disturbances of salivary secretion: Secondary | ICD-10-CM

## 2013-02-08 DIAGNOSIS — F73 Profound intellectual disabilities: Secondary | ICD-10-CM

## 2013-02-08 DIAGNOSIS — G4710 Hypersomnia, unspecified: Secondary | ICD-10-CM

## 2013-02-08 DIAGNOSIS — G808 Other cerebral palsy: Secondary | ICD-10-CM

## 2013-02-08 DIAGNOSIS — G40319 Generalized idiopathic epilepsy and epileptic syndromes, intractable, without status epilepticus: Secondary | ICD-10-CM

## 2013-02-08 MED ORDER — BACLOFEN 10 MG PO TABS: ORAL_TABLET | ORAL | Status: DC

## 2013-02-08 MED ORDER — DEPAKOTE SPRINKLES 125 MG PO CPSP: ORAL_CAPSULE | ORAL | Status: DC

## 2013-02-08 MED ORDER — GABAPENTIN 300 MG PO CAPS: ORAL_CAPSULE | ORAL | Status: DC

## 2013-02-08 MED ORDER — GLYCOPYRROLATE 2 MG PO TABS: ORAL_TABLET | ORAL | Status: DC

## 2013-02-08 NOTE — Progress Notes (Signed)
Patient: [name]. Tsuda MRN: [id number] Sex: male DOB: 07-Jul-1990  Provider: Deetta Perla, MD Location of Care: [facility]  Note type: Routine return visit  History of Present Illness: Referral Source: Dr. Doralee Albino History from: father and [facility] chart Chief Complaint: Seizures  [name] is a 22 y.o. male who returns for evaluation and management of epilepsy, and sequelae of intrauterine cytomegalovirus.  The patient returns on February 08, 2013, for the first time since September 16, 2012.  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  The patient's seizures include generalized tonic-clonic, which were infrequent, myoclonic which happen multiple times per day and complex partial seizures, one of which I witnessed today.  I was examining his eyes when he suddenly stiffened, his eyes deviated up to the left.  His face became red.  He had some increased tonic activity of his left arm.  His face became somewhat dusky.  The episode lasted for about 30 seconds.  His eyes then returned to neutral position and he relaxed.  His medical problems include dysphagia, which is treated with feeding gastrostomy.  He was hospitalized Jun 30, 2011, through Jul 08, 2011, for community acquired pneumonia with Pseudomonas.  He was treated for 21 days with ciprofloxacin.  He has been followed by pulmonary.  The family has insisted that his condition be managed as best it can be without intubation or tracheostomy.  The patient has not had further episodes of pneumonia as best I understand.  His gastrostomy tube is managed by radiology.  He was here today with his father who says that his seizures were not frequent or prolonged, but it is not uncommon for him to have several of the day, although  since he does not have significant postictal state.  He has severe spasticity managed to some extent with baclofen, but long ago has reached a degree of contractures that will not respond to medicines to lessen spasticity.  The patient was in a day program, but was removed from that after he has pneumonia.  He stays at home.  He is here for routine follow up.  There were no other significant problems in the past four months.  Review of Systems: 12 system review was remarkable for seizure  Past Medical History  Diagnosis Date  . Allergy   . Asthma   . Cerebral palsy, quadriplegic   . Congenital CMV infection   . Neuromuscular disorder     cp  . H/O hiatal hernia   . Pneumonia     "he's had it several times; maybe now" (12/17/2011)  . Community acquired pneumonia 06/30/2011    Psuedomonas PNA 06/2011 >tx 21 d of Cipro  CXR 08/15/11 >clearance of PNA    . GERD (gastroesophageal reflux disease)   . Seizures     "has had gran mals before" (12/17/2011)  . Cytomegalovirus   . On home oxygen therapy     "at night" (12/17/2011"  . Jejunostomy tube present   . Acute and chronic respiratory failure 12/24/2011   Hospitalizations: yes, Head Injury: no, Nervous System Infections: no, Immunizations up to date: yes Past Medical History Comments: Patient has been hospitalized several times over the years due to surgeries and pneumonia.  Behavior History none  Surgical History Past Surgical History  Procedure Laterality Date  . Nissen fundoplication    . Rod  ~  2003 "or before"    "in his back" (12/17/2011  . Peg tube placement    . Percutaneous gastrotomy tube replacement     Surgeries: yes Surgical History Comments: See Hx  Family History family history includes Allergies in his mother; Colon cancer in his paternal grandmother; Diabetes in his father; Heart disease in his maternal uncle. Family History is negative migraines, seizures, cognitive impairment, blindness, deafness, birth  defects, chromosomal disorder, autism.  Social History History   Social History  . Marital Status: Single    Spouse Name: N/A    Number of Children: N/A  . Years of Education: N/A   Social History Main Topics  . Smoking status: Never Smoker   . Smokeless tobacco: Never Used  . Alcohol Use: No  . Drug Use: No  . Sexual Activity: No   Other Topics Concern  . None   Social History Narrative  . None   Educational level special education School Attending: [name] graduated from [employer] June 2013. Living with parents and siblings     Current Outpatient Prescriptions on File Prior to Visit  Medication Sig Dispense Refill  . baclofen (LIORESAL) 10 MG tablet TAKE 2 TABLETS 3 TIMES A DAY  180 tablet  0  . DEPAKOTE SPRINKLES 125 MG capsule Take 3 caps by mouth in the morning, 2 caps midday and 3 caps at bedtime.  250 capsule  5  . Feeding Tubes - Pump (KANGAROO JOEY ENTERAL PUMP) MISC 30 Units by Does not apply route daily. Please provide bags for Kangroo Joey enteral feeding device. Should be changed daily.  30 each  12  . gabapentin (NEURONTIN) 300 MG capsule TAKE ONE CAPSULE PER TUBE 3 TIMES DAILY  90 capsule  2  . glycopyrrolate (ROBINUL) 2 MG tablet Take 2 mg by mouth 2 (two) times daily.      . Hydrocortisone (GERHARDT'S BUTT CREAM) CREA Apply 1 application topically 2 (two) times daily as needed. Please provide remained of tube used in hospital      . Incontinence Supplies MISC May have incontinence supplies as determined by home health agency.  30 each  0  . Nutritional Supplements (FEEDING SUPPLEMENT, JEVITY 1.2 CAL,) LIQD Place 1,000 mLs into feeding tube continuous.      Marland Kitchen nystatin cream (MYCOSTATIN) Apply topically 2 (two) times daily as needed (Redness).  30 g  1  . omeprazole (PRILOSEC) 2 mg/mL SUSP Take 10 mLs (20 mg total) by mouth daily at 12 noon.  150 mL  11  . PROVENTIL HFA 108 (90 BASE) MCG/ACT inhaler USE 2 PUFFS EVERY 4 HOURS AS NEEDED FOR WHEEZE   1 Inhaler  0   No current facility-administered medications on file prior to visit.   The medication list was reviewed and reconciled. All changes or newly prescribed medications were explained.  A complete medication list was provided to the patient/caregiver.  Allergies  Allergen Reactions  . Sulfamethoxazole-Trimethoprim Rash  . Topamax [Topiramate]     Physical Exam BP 84/64  Pulse 84  General: Awake but not alert thin, spastic deformities of his limbs, in no acute distress, non-handed, Brown curly hair, blue eyes  Head: microcephalic, no dysmorphic features  Ears, Nose and Throat: Otoscopic: tympanic membranes can be seen on the left, wax occludes the right. Pharynx: oropharynx is pink without exudates or tonsillar hypertrophy.  Neck: supple, full range of motion, no cranial or cervical bruits  Respiratory: auscultation clear  Cardiovascular: no murmurs, pulses are normal  Musculoskeletal:  he has severe contractures of his arms with clawhand deformities, flexion contractures of the elbows, shoulders, wrists, hips, knees, externally rotated feet with tight heel cords, repaired scoliosis. I can extend the right leg completely. There are flexion contractions in the left knee and both elbows right greater than left.  Skin: no rashes or neurocutaneous lesions   Neurologic Exam   Mental Status: asleep throughout; he follows no commands. He is mute  Cranial Nerves: Cortical blindness with significant optic disc pallor, and minimally reactive pupils, slowly roving eye movements, impassive face with lower facial weakness, inability to protrude his tongue, does not localize sound in either visual field, I did not test corneals or gag response.  Motor: spastic quadriparesis with tone that appears to be greater on the left side than the right, decreased muscle mass on the left arm, arms and legs are flexed. left arm can be nearly fully extended, the right has a flexion contracture; claw-hand  deformities in his hands bilaterally; legs can not able to be extended right worse than left; feet are externally rotated, left grater than right; tight heel cords. He has very limited spontaneous movements.  Sensory: withdrawal x4 to the limits of his strength  Coordination: no tremor, unable to test  Gait and Station: wheelchair-bound with difficulty with head control at times ( head flops forward)  Reflexes: diminished bilaterally due to co-contraction; sustained ankle clonus on the left, 2-3 beats of ankle clonus on the right; bilateral extensor plantar responses  Assessment 1. 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 2. 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 3. Myoclonus (333.2). 4. 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 5. 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 6. Excessive somnolence (780.54). 7. Sialorrhea (527.7). 8. 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  Plan I refilled prescriptions for glycopyrrolate, gabapentin, Depakote, and baclofen.  I did not make any changes.  I spent 30 minutes of face-to-face time with the patient and his father and brother, more than half of it in consultation.  I will plan to see him in six months, sooner depending upon clinical need.  Deetta Perla MD

## 2013-02-10 ENCOUNTER — Other Ambulatory Visit: Payer: Self-pay | Admitting: Family Medicine

## 2013-02-16 ENCOUNTER — Encounter (HOSPITAL_BASED_OUTPATIENT_CLINIC_OR_DEPARTMENT_OTHER): Payer: BC Managed Care – PPO | Attending: General Surgery

## 2013-02-16 DIAGNOSIS — Z79899 Other long term (current) drug therapy: Secondary | ICD-10-CM | POA: Insufficient documentation

## 2013-02-16 DIAGNOSIS — L89309 Pressure ulcer of unspecified buttock, unspecified stage: Secondary | ICD-10-CM | POA: Insufficient documentation

## 2013-02-16 DIAGNOSIS — G8250 Quadriplegia, unspecified: Secondary | ICD-10-CM | POA: Insufficient documentation

## 2013-02-16 DIAGNOSIS — L8992 Pressure ulcer of unspecified site, stage 2: Secondary | ICD-10-CM | POA: Insufficient documentation

## 2013-02-16 NOTE — Progress Notes (Signed)
[facility]  NAME:  BUDD, FREIERMUTH               ACCOUNT NO.:  [id number]  MEDICAL RECORD NO.:  [id number]      DATE OF BIRTH:  11-May-1990  PHYSICIAN:  Ardath Sax, M.D.           VISIT DATE:                                  OFFICE VISIT   This is a 22 year old, severely brain damaged individual who is a quadriplegic, apparently had some sort of brain damage at the time of his birth, but he is quadriplegic and is taken care by his mother at home.  He was found to have a temperature of 98, pulse of 60, respirations 18, blood pressure 105/71.  He weighs 95 pounds.  He comes to [place] with a stage II pressure sore on his right ischial area.  His wound is, I would classify it as stage II and I would highly recommend that he gets an air mattress before this becomes much worse.  His medicines are Neurontin, Depakote, baclofen, Robinul, and omeprazole. He is allergic to sulfa.  He has had a rod placement in his spine when he was 22 years old.  The pressure ulcers on his right ischial area, I am going to treat it today with DuoDERM and then we will write to [facility], to see if we could get him qualified for on air mattress as this is going to just get worse if we cannot keep pressure off, and since he is quadriplegic and needs to be in bed all day long, I think air mattress is only salvation.     Ardath Sax, M.D.     PP/MEDQ  D:  02/16/2013  T:  02/16/2013  Job:  [id number]

## 2013-02-28 ENCOUNTER — Ambulatory Visit (HOSPITAL_COMMUNITY)
Admission: RE | Admit: 2013-02-28 | Discharge: 2013-02-28 | Disposition: A | Discharge status: Home / Self Care | Payer: BC Managed Care – PPO | Source: Ambulatory Visit | Attending: Family Medicine | Admitting: Family Medicine

## 2013-02-28 ENCOUNTER — Ambulatory Visit (INDEPENDENT_AMBULATORY_CARE_PROVIDER_SITE_OTHER): Payer: BC Managed Care – PPO | Admitting: Family Medicine

## 2013-02-28 VITALS — BP 99/61 | HR 88 | Temp 96.5°F

## 2013-02-28 DIAGNOSIS — M169 Osteoarthritis of hip, unspecified: Secondary | ICD-10-CM | POA: Insufficient documentation

## 2013-02-28 DIAGNOSIS — X58XXXA Exposure to other specified factors, initial encounter: Secondary | ICD-10-CM | POA: Insufficient documentation

## 2013-02-28 DIAGNOSIS — M25551 Pain in right hip: Secondary | ICD-10-CM

## 2013-02-28 DIAGNOSIS — R600 Localized edema: Secondary | ICD-10-CM

## 2013-02-28 DIAGNOSIS — G809 Cerebral palsy, unspecified: Secondary | ICD-10-CM | POA: Insufficient documentation

## 2013-02-28 DIAGNOSIS — M7989 Other specified soft tissue disorders: Secondary | ICD-10-CM

## 2013-02-28 DIAGNOSIS — M25559 Pain in unspecified hip: Secondary | ICD-10-CM

## 2013-02-28 DIAGNOSIS — S63066A Dislocation of metacarpal (bone), proximal end of unspecified hand, initial encounter: Secondary | ICD-10-CM | POA: Insufficient documentation

## 2013-02-28 DIAGNOSIS — M1610 Unilateral primary osteoarthritis, unspecified hip: Secondary | ICD-10-CM | POA: Insufficient documentation

## 2013-02-28 DIAGNOSIS — R609 Edema, unspecified: Secondary | ICD-10-CM

## 2013-02-28 NOTE — Patient Instructions (Addendum)
I will call you with the xrays results and will discuss plan at that point. Make your next appointment as scheduled with your primary doctor.

## 2013-02-28 NOTE — Progress Notes (Signed)
Family Medicine Office Visit Note   Subjective:   Patient ID: [name], male  DOB: 12/28/1990, 23 y.o.. MRN: [id number]   Parents (mother/father) bring [name] today with concerns about his right hip and hand. [name] has PMHx significant for cerebral palsy, MR, and seizure disorder. Parents report he had a bed sore that resolved about 4 weeks ago (it was treated by wound care) and in the past week they have noticed changes described as follows:  Rigth hip: mother reports hearing intermittent popping sound when repositioning pt on chair or bed. Also noticed an inward position of his right leg. Pt does not seem to be in pain. Parents report have not seen bruises, redness or swelling of his hip and right leg.  Right hand: increase in swelling on his knuckles without redness or bruises. When they try to manipulate his hand pt retracts upper extremity, but otherwise he does not appear to be in pain.  No history of known trauma   Review of Systems:  Per HPI also parents denie fever, chills, vomiting, diarrhea or other systemic symptoms. No recent seizures are reported.  Objective:   Physical Exam: GEN: pt in a wheel chair asleep during examination. PULM: normal work of breathing. MSK:  Marked contracture of extremities.  Right hand: visibly deformed hand habitus. Moderated non-pitting edema present on 2,3, and 4th metacarpophalangeal joints. No erythema, no ecchymosis. Pt retracts upper extremity with passive ROM.  Right Hip: unable to explore ROM due to pt habitus. Does not appear rotated compared with left side. No erythema,ecchimosis or edema noticed on examination.   Extremities have normal pulses.  Assessment & Plan:

## 2013-03-01 DIAGNOSIS — R600 Localized edema: Secondary | ICD-10-CM | POA: Insufficient documentation

## 2013-03-01 NOTE — Assessment & Plan Note (Signed)
Physical exam positive for edema, no signs of infection, no hx of trauma.  Will order xray to better assess since pt body habitus (severe contractures) makes physical exam less reliable. Same with mother's concern about right hip. Since pt does not appear to be in pain will discuss plan once xrays are back.

## 2013-03-02 ENCOUNTER — Encounter (HOSPITAL_BASED_OUTPATIENT_CLINIC_OR_DEPARTMENT_OTHER): Payer: BC Managed Care – PPO | Attending: General Surgery

## 2013-03-03 ENCOUNTER — Telehealth: Payer: Self-pay | Admitting: Family Medicine

## 2013-03-03 NOTE — Telephone Encounter (Signed)
Xrays reviewed with Dr. Jennette KettleNeal. No further recommendations at this point since subluxation of metacarpal seems chronic in nature and he does not seem to be in pain. Discussed with mother over the phone this results. She reports he used to have a Orthopedist as child but has no f/u with Ortho since he became adult age. Discussed signs that should prompt re-evaluation. Will  leave to PCP discretion referral to Ortho if more concerns arise.

## 2013-04-29 ENCOUNTER — Other Ambulatory Visit: Payer: Self-pay | Admitting: Family

## 2013-04-30 ENCOUNTER — Other Ambulatory Visit: Payer: Self-pay | Admitting: Family

## 2013-05-02 ENCOUNTER — Other Ambulatory Visit: Payer: Self-pay

## 2013-05-02 DIAGNOSIS — G40219 Localization-related (focal) (partial) symptomatic epilepsy and epileptic syndromes with complex partial seizures, intractable, without status epilepticus: Secondary | ICD-10-CM

## 2013-05-02 DIAGNOSIS — G40319 Generalized idiopathic epilepsy and epileptic syndromes, intractable, without status epilepticus: Secondary | ICD-10-CM

## 2013-07-01 ENCOUNTER — Telehealth: Payer: Self-pay | Admitting: Family Medicine

## 2013-07-01 NOTE — Telephone Encounter (Signed)
Spoke with mother and informed I had given script to Jone BasemanJessica Fleeger, CMA who will fax at this time. She will call us if she hasn't heard anything by 4pm. I am happy to help in any way I can and can be directly paged [phone] but am in the office this afternoon and at nursing home as well.

## 2013-07-01 NOTE — Telephone Encounter (Signed)
Mother called because her son's feeding pump quit working at 5 am this morning. [facility] said that Dr. Durene CalHunter has to write a new prescription so that it can be replaced. They need the [employer]angaroo Joey Feeding Pump that use the flush and feed bag. She is very concerned because it is Friday and the weekend coming. She doesn't want her son to be without the feeding pump. Please call ASAP to get this replaced. If you have any questions please call her at [phone]. jw

## 2013-07-01 NOTE — Telephone Encounter (Signed)
Faxed to nutrition team @ [phone]. Princella PellegriniJessica D Sreekar Broyhill

## 2013-07-01 NOTE — Telephone Encounter (Signed)
[facility] calls back, [facility] [phone]. Dr. Durene CalHunter will also need to send supporting documentation for reason for pump (Patient w/ feeding pump since age of 4, takes nothing by mouth).

## 2013-08-10 ENCOUNTER — Telehealth: Payer: Self-pay | Admitting: Family Medicine

## 2013-08-10 ENCOUNTER — Telehealth: Payer: Self-pay

## 2013-08-10 DIAGNOSIS — K117 Disturbances of salivary secretion: Secondary | ICD-10-CM

## 2013-08-10 MED ORDER — GLYCOPYRROLATE 2 MG PO TABS: ORAL_TABLET | ORAL | Status: DC

## 2013-08-10 NOTE — Telephone Encounter (Signed)
Rx has been sent. TG 

## 2013-08-10 NOTE — Telephone Encounter (Signed)
Mother request Dr. Durene CalHunter to write a letter of medical necessity for a air mattress for patient. This is to alleviate pressure of current bed sore and to prevent new ones. Please call [name] with any questions.

## 2013-08-10 NOTE — Telephone Encounter (Signed)
[name], mom, lvm stating that the current Rx for Glycopyrrolate 2 mg  2 tabs per G-Tube BID is incorrect. She needs new Rx sent for Glycopyrrolate 2 mg tabs 1 tab per G-Tube BID. The Rx needs to match the Med Administration form for the care [name]. Pt has f/u visit w Dr. Rexene EdisonH on 09/07/13. Called mom and lvm letting her know that we received her message and to check with the pharmacy later today for the refill with correct sig.

## 2013-08-11 ENCOUNTER — Encounter: Payer: Self-pay | Admitting: Family Medicine

## 2013-08-11 NOTE — Telephone Encounter (Signed)
Letter written. Mother to pick up.

## 2013-08-15 ENCOUNTER — Other Ambulatory Visit: Payer: Self-pay | Admitting: Pediatrics

## 2013-09-01 ENCOUNTER — Other Ambulatory Visit: Payer: Self-pay | Admitting: Family Medicine

## 2013-09-01 DIAGNOSIS — G40909 Epilepsy, unspecified, not intractable, without status epilepticus: Secondary | ICD-10-CM

## 2013-09-01 NOTE — Progress Notes (Signed)
[facility] from [facility]. Updated med list in epic

## 2013-09-07 ENCOUNTER — Ambulatory Visit: Payer: BC Managed Care – PPO | Admitting: Pediatrics

## 2013-09-08 ENCOUNTER — Ambulatory Visit (HOSPITAL_COMMUNITY)
Admission: RE | Admit: 2013-09-08 | Discharge: 2013-09-08 | Disposition: A | Discharge status: Home / Self Care | Payer: BC Managed Care – PPO | Source: Ambulatory Visit | Attending: Diagnostic Radiology | Admitting: Diagnostic Radiology

## 2013-09-08 ENCOUNTER — Other Ambulatory Visit (HOSPITAL_COMMUNITY): Payer: Self-pay | Admitting: Diagnostic Radiology

## 2013-09-08 DIAGNOSIS — R627 Adult failure to thrive: Secondary | ICD-10-CM | POA: Insufficient documentation

## 2013-09-08 DIAGNOSIS — K9413 Enterostomy malfunction: Principal | ICD-10-CM

## 2013-09-08 DIAGNOSIS — R6251 Failure to thrive (child): Secondary | ICD-10-CM

## 2013-09-08 DIAGNOSIS — K9403 Colostomy malfunction: Secondary | ICD-10-CM | POA: Diagnosis present

## 2013-09-08 MED ORDER — IOHEXOL 300 MG/ML  SOLN
50.0000 mL | Freq: Once | INTRAMUSCULAR | Status: AC | PRN
  Administered 2013-09-08: 15 mL

## 2013-09-08 NOTE — Procedures (Signed)
Successful replacement of the GJ tube.  Tip in jejunum and ready to use.  No immediate complication.

## 2013-09-13 ENCOUNTER — Other Ambulatory Visit: Payer: Self-pay | Admitting: Family

## 2013-10-21 ENCOUNTER — Ambulatory Visit (INDEPENDENT_AMBULATORY_CARE_PROVIDER_SITE_OTHER): Payer: BC Managed Care – PPO | Admitting: Pediatrics

## 2013-10-21 ENCOUNTER — Other Ambulatory Visit: Payer: Self-pay | Admitting: Family

## 2013-10-21 ENCOUNTER — Encounter: Payer: Self-pay | Admitting: Pediatrics

## 2013-10-21 VITALS — BP 84/60 | HR 96

## 2013-10-21 DIAGNOSIS — G808 Other cerebral palsy: Secondary | ICD-10-CM

## 2013-10-21 DIAGNOSIS — G40319 Generalized idiopathic epilepsy and epileptic syndromes, intractable, without status epilepticus: Secondary | ICD-10-CM

## 2013-10-21 DIAGNOSIS — F73 Profound intellectual disabilities: Secondary | ICD-10-CM

## 2013-10-21 DIAGNOSIS — K117 Disturbances of salivary secretion: Secondary | ICD-10-CM

## 2013-10-21 DIAGNOSIS — G40219 Localization-related (focal) (partial) symptomatic epilepsy and epileptic syndromes with complex partial seizures, intractable, without status epilepticus: Secondary | ICD-10-CM

## 2013-10-21 MED ORDER — GLYCOPYRROLATE 2 MG PO TABS: ORAL_TABLET | ORAL | Status: DC

## 2013-10-21 MED ORDER — GABAPENTIN 300 MG PO CAPS: ORAL_CAPSULE | ORAL | Status: DC

## 2013-10-21 MED ORDER — BACLOFEN 10 MG PO TABS: ORAL_TABLET | ORAL | Status: DC

## 2013-10-21 MED ORDER — DEPAKOTE SPRINKLES 125 MG PO CPSP: ORAL_CAPSULE | ORAL | Status: DC

## 2013-10-21 NOTE — Progress Notes (Signed)
Patient: [name]. Leder MRN: [id number] Sex: male DOB: [birth year]  Provider: Deetta Perla, MD Location of Care: [facility]  Note type: Routine return visit  History of Present Illness: Referral Source: Dr. Doralee Albino History from: father and [facility] chart Chief Complaint: Epilepsy/Congenital Quadriplegia   [name]. Schumpert is a 23 y.o. who returns for evaluation and management of sequelae of congenital cytomegalovirus.  [name] returns October 21, 2013 for the first time since February 08, 2013.  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  His seizures include generalized tonic-clonic events which are infrequent, myoclonic multiple times per day, and complex partial seizures.  He is here today with his father he says that 2 - 3 times a day he has episodes of staring.  I witnessed one last time during which time he stiffened, his eyes deviated to the left, he had flushing of his face tonic posturing of the left arm, and perioral cyanosis.  The episode lasted for about 30 seconds.  He has other episodes where he makes a high-pitched sound and has coughing that may represent a seizure.  He has severe dysphagia and takes his medications and formulas trhough a gastrostomy tube.  His parents have been able to prolong the longevity the tubes by flushing water simultaneously after meds or formula was given.  They need to be changed every 6-10 months.  His overall health has been good.  There have been no episodes of aspiration pneumonia.  He is a small area of erythema in his right buttock but his parents keep his skin in great condition.  He sleeps between 11 PM and 6 or 7 AM.  His father had no other concerns today.  Review of Systems: 12 system review was unremarkable  Past  Medical History  Diagnosis Date  . Allergy   . Asthma   . Cerebral palsy, quadriplegic   . Congenital CMV infection   . Neuromuscular disorder     cp  . H/O hiatal hernia   . Pneumonia     "he's had it several times; maybe now" (12/17/2011)  . Community acquired pneumonia 06/30/2011    Psuedomonas PNA 06/2011 >tx 21 d of Cipro  CXR 08/15/11 >clearance of PNA    . GERD (gastroesophageal reflux disease)   . Seizures     "has had gran mals before" (12/17/2011)  . Cytomegalovirus   . On home oxygen therapy     "at night" (12/17/2011"  . Jejunostomy tube present   . Acute and chronic respiratory failure 12/24/2011   Hospitalizations: No., Head Injury: No., Nervous System Infections: No., Immunizations up to date: Yes.   Past Medical History He was hospitalized Jun 30, 2011, through Jul 08, 2011, for community acquired pneumonia with Pseudomonas. He was treated for 21 days with ciprofloxacin. He has been followed by pulmonary.   His family has insisted that his condition be managed as best it can be without intubation or tracheostomy.  Behavior History none  Surgical History Past Surgical History  Procedure Laterality Date  . Nissen fundoplication    . Rod  ~ 2003 "or before"    "in his back" (12/17/2011  . Peg tube placement    . Percutaneous gastrotomy tube replacement      Family History family history includes Allergies in his mother; Colon cancer in his paternal grandmother; Diabetes in his father; Heart  disease in his maternal uncle. Family history is negative for migraines, seizures, intellectual disabilities, blindness, deafness, birth defects, chromosomal disorder, or autism.  Social History History   Social History  . Marital Status: Single    Spouse Name: N/A    Number of Children: N/A  . Years of Education: N/A   Social History Main Topics  . Smoking status: Never Smoker   . Smokeless tobacco: Never Used  . Alcohol Use: No  . Drug Use: No  . Sexual  Activity: No   Other Topics Concern  . None   Social History Narrative  . None   Educational level special education Living with parents and siblings   Hobbies/Interest: Enjoys watching TV School comments [employer] completed [employer] in 2012.   Allergies  Allergen Reactions  . Sulfamethoxazole-Trimethoprim Rash  . Topamax [Topiramate]     Physical Exam BP 84/60  Pulse 96  General: Awake but not alert thin, spastic deformities of his limbs, in no acute distress, non-handed, brown curly hair, blue eyes  Head: microcephalic, no dysmorphic features  Ears, Nose and Throat: Otoscopic: tympanic membranes can be seen on the left, wax occludes the right. Pharynx: oropharynx is pink without exudates or tonsillar hypertrophy.  Neck: supple, full range of motion, no cranial or cervical bruits  Respiratory: auscultation clear  Cardiovascular: no murmurs, pulses are normal  Musculoskeletal: he has severe contractures of his arms with clawhand deformities, flexion contractures of the elbows, shoulders, wrists, hips, knees, externally rotated feet with tight heel cords, repaired scoliosis. I can't extend the right leg completely. There are flexion contractions in the left knee and both elbows right greater than left.  Skin: no rashes or neurocutaneous lesions   Neurologic Exam   Mental Status: asleep throughout; he follows no commands. He is mute  Cranial Nerves: Cortical blindness with significant optic disc pallor, and minimally reactive pupils, slowly roving eye movements, impassive face with lower facial weakness, inability to protrude his tongue, does not localize sound in either visual field, I did not test corneals or gag response.  Motor: spastic quadriparesis with tone that appears to be greater on the left side than the right, decreased muscle mass on the left arm, arms and legs are flexed. left arm can be nearly fully extended, the right has a flexion contracture;  claw-hand deformities in his hands bilaterally; legs can not be extended fully right better than left; feet are externally rotated, left greater than right; tight heel cords. He has very limited spontaneous movements.  Sensory: withdrawal x4 to the limits of his strength  Coordination: no tremor, unable to test  Gait and Station: wheelchair-bound with difficulty with head control at times ( head flops forward)  Reflexes: diminished bilaterally due to co-contraction; sustained ankle clonus on the left, 2-3 beats of ankle clonus on the right; bilateral extensor plantar responses  Assessment 1.  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  Generalized convulsive epilepsy with intractable epilepsy, 345.11. 3.  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 4.  Profound intellectual disabilities, 318.2. 5.  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 6.  Sialorrhea, 527.7.  Discussion Vijay remains neurologically stable functioning on an extremely low level.  His seizures have not worsened.  His father believe that we should make no changes in his current medications.  Plan Prescription refills for baclofen, divalproex, gabapentin, and glycopyrrolate.  He will return in 6 months for ongoing evaluation and management.  I spent 30 minutes face-to-face time with [name]  and his father, more than half consultation.  Medication List       This list is accurate as of: 10/21/13 12:25 PM.          baclofen 10 MG tablet  Commonly known as:  LIORESAL  TAKE 2 TABLETS BY MOUTH 3 TIMES A DAY     DEPAKOTE SPRINKLES 125 MG capsule  Generic drug:  divalproex  TAKE 3 CAPSULES IN THE MORNING, 2 CAPSULES MIDDAY, AND 3 CAPSULES AT BEDTIME     feeding supplement (JEVITY 1.2 CAL) Liqd  Place 1,000 mLs into feeding tube continuous.     gabapentin 300 MG capsule  Commonly known as:  NEURONTIN  TAKE ONE CAPSULE PER TUBE 3 TIMES DAILY      Gerhardt's butt cream Crea  Apply 1 application topically 2 (two) times daily as needed. Please provide remained of tube used in hospital     glycopyrrolate 2 MG tablet  Commonly known as:  ROBINUL  1 tablet per gastrostomy tube twice daily     Incontinence Supplies Misc  May have incontinence supplies as determined by home health agency.     KANGAROO JOEY ENTERAL PUMP Misc  30 Units by Does not apply route daily. Please provide bags for Kangroo Joey enteral feeding device. Should be changed daily.     nystatin cream  Commonly known as:  MYCOSTATIN  Apply topically 2 (two) times daily as needed (Redness).     omeprazole 2 mg/mL Susp  Commonly known as:  PRILOSEC  Take 10 mLs (20 mg total) by mouth daily at 12 noon.     PROVENTIL HFA 108 (90 BASE) MCG/ACT inhaler  Generic drug:  albuterol  USE 2 PUFFS EVERY 4 HOURS AS NEEDED FOR WHEEZE      The medication list was reviewed and reconciled. All changes or newly prescribed medications were explained.  A complete medication list was provided to the patient/caregiver.  Deetta Perla MD

## 2013-10-28 IMAGING — CR DG CHEST 1V PORT
1 series · 1 of 1 positions shown · non-contrast
Comparison: 12/06/2010

CLINICAL DATA: Respiratory distress

PORTABLE CHEST - 1 VIEW

[view not recorded]
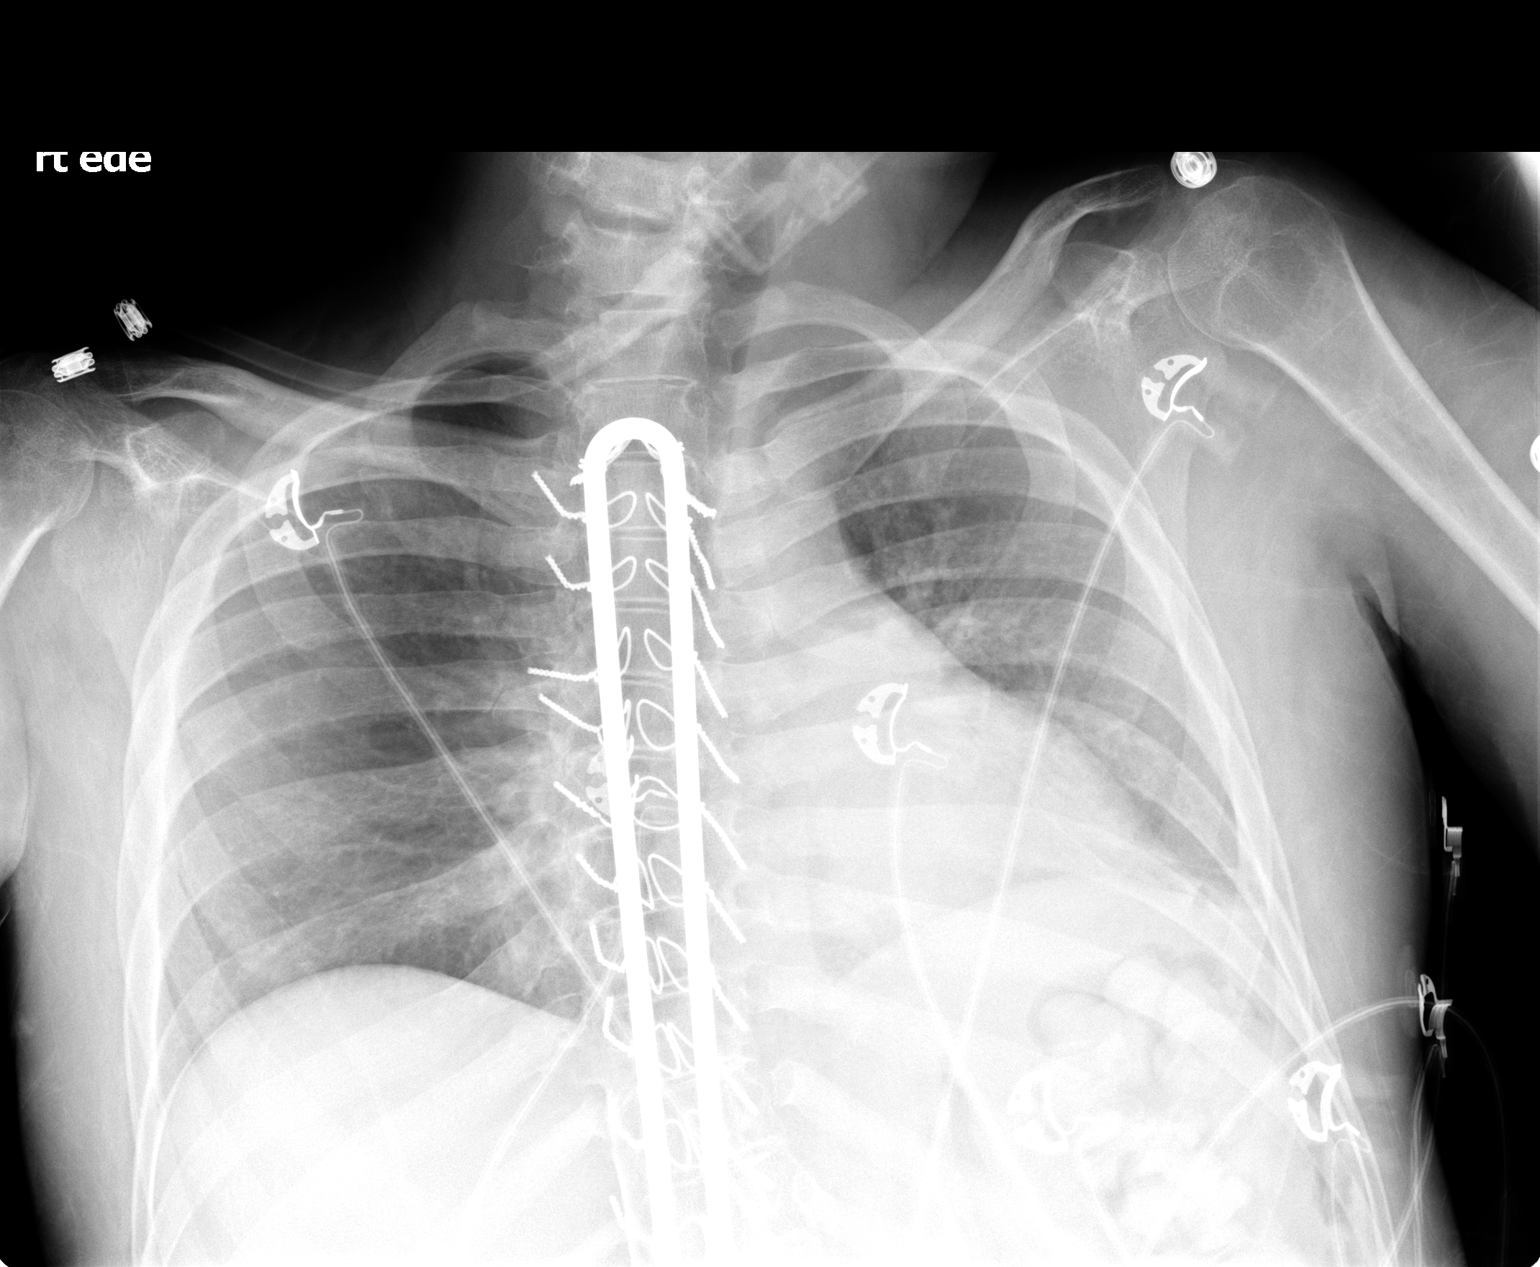

[1 of 1 positions shown; findings below may reference images not displayed]

FINDINGS: Cardiomediastinal silhouette is stable.  Spinal fixation
rods thoracolumbar spine again noted.  No pulmonary edema.  There
is subtle hazy left basilar atelectasis or infiltrate.
IMPRESSION: Subtle hazy left basilar atelectasis or infiltrate.  No pulmonary
edema.  Spinal fixation rods thoracolumbar spine again noted.

## 2013-10-29 IMAGING — CR DG CHEST 1V PORT
1 series · 1 of 1 positions shown · non-contrast
Comparison: 06/30/2011

CLINICAL DATA: Follow up pneumonia

PORTABLE CHEST - 1 VIEW

[AP]
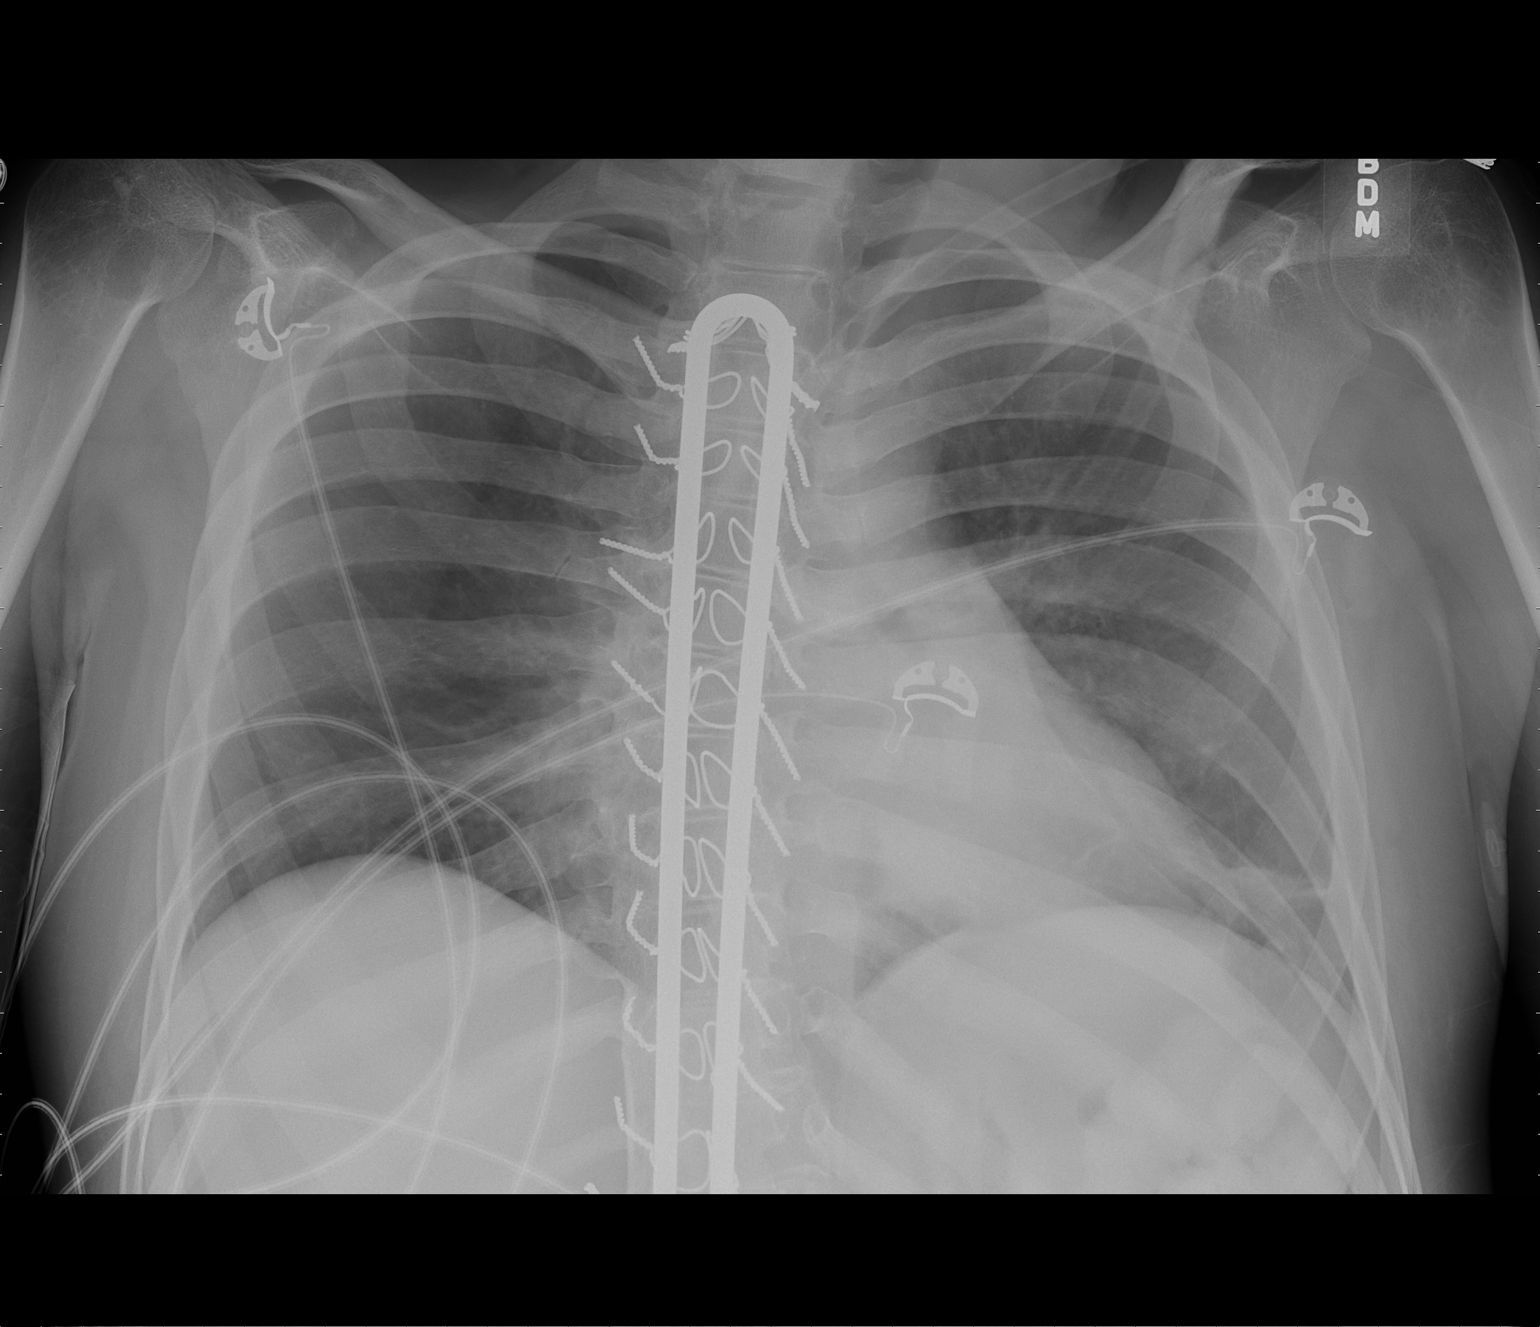

[1 of 1 positions shown; findings below may reference images not displayed]

FINDINGS: Cardiomediastinal silhouette is stable.  Spinal fixation
rods thoracolumbar spine again noted.  No pulmonary edema.  There
is  residual left basilar atelectasis or infiltrate with slight
improved aeration.
IMPRESSION: Residual left basilar atelectasis or infiltrate with slight
improved aeration.  No pulmonary edema.

## 2013-11-14 ENCOUNTER — Other Ambulatory Visit: Payer: Self-pay | Admitting: *Deleted

## 2013-11-14 MED ORDER — OMEPRAZOLE 2 MG/ML ORAL SUSPENSION: 20.0000 mg | Freq: Every day | ORAL | Status: AC

## 2013-12-09 ENCOUNTER — Other Ambulatory Visit: Payer: Self-pay

## 2013-12-12 ENCOUNTER — Encounter: Payer: Self-pay | Admitting: Family Medicine

## 2013-12-12 ENCOUNTER — Ambulatory Visit
Admission: RE | Admit: 2013-12-12 | Discharge: 2013-12-12 | Disposition: A | Discharge status: Home / Self Care | Payer: BC Managed Care – PPO | Source: Ambulatory Visit | Attending: Family Medicine | Admitting: Family Medicine

## 2013-12-12 ENCOUNTER — Ambulatory Visit (INDEPENDENT_AMBULATORY_CARE_PROVIDER_SITE_OTHER): Payer: BC Managed Care – PPO | Admitting: Family Medicine

## 2013-12-12 ENCOUNTER — Other Ambulatory Visit: Payer: Self-pay | Admitting: Family Medicine

## 2013-12-12 VITALS — BP 92/65 | HR 96 | Temp 98.0°F

## 2013-12-12 DIAGNOSIS — R05 Cough: Secondary | ICD-10-CM

## 2013-12-12 DIAGNOSIS — R059 Cough, unspecified: Secondary | ICD-10-CM

## 2013-12-12 MED ORDER — ALBUTEROL SULFATE (2.5 MG/3ML) 0.083% IN NEBU: 2.5000 mg | INHALATION_SOLUTION | Freq: Four times a day (QID) | RESPIRATORY_TRACT | Status: AC | PRN

## 2013-12-12 NOTE — Progress Notes (Signed)
Patient ID: [name], male   DOB: 07/15/1990, 23 y.o.   MRN: [id number]  Todd AlarEric Danniella Robben, MD Phone: [phone]  [name] is a 23 y.o. male who presents today for same day appointment.   Cough: mother notes cough doe 2 weeks and increased congestion 1 week ago. She then notes this past Saturday he had an episode of panting for breath and they checked his oxygen which they report was 70% at that time. They gave him an albuterol treatment and did chest PT and he appeared improved with these interventions. Sunday night he had a short period of "tummy breathing" with oxygen "<90%" though this improved quickly. He has note had any sick contacts recently. He is not able to talk so they do not know of other symptoms. No fever. Mother notes he has a history of RAD and is currently just taking albuterol prn. He has been doing well since last night and has not had any breathing issues today. Parents are worried that he may have a PNA.   Patient is a nonsmoker.   ROS: Per HPI   Physical Exam Filed Vitals:   12/12/13 1020  BP: 92/65  Pulse: 96  Temp: 98 F (36.7 C)  O2 sat 95%  Gen: NAD sitting in wheel chair HEENT: MMM Lungs: CTABL Nl WOB Heart: RRR no MRG   Assessment/Plan: Please see individual problem list.   Todd AlarEric Madisyn Mawhinney, MD [facility] PGY-3

## 2013-12-12 NOTE — Patient Instructions (Signed)
Nice to see you. We will get a chest x-ray.  Please do the albuterol every 6 hours for the next 2 days.  Continue to do chest PT as you have been.  If he has another episode where his oxygen drops or he appears short of breath please seek medical attention.

## 2013-12-12 NOTE — Assessment & Plan Note (Addendum)
Patient with cough for 2 weeks and 2 episodes of increased work of breathing over the weekend. CXR revealed no acute process. Pulmonary exam with no evidence of wheezing to indicate reactive airway disease. O2 sat in normal range in the office. No indication for antibiotics at this time. Suspect this is likely a post viral illness cough, though potentially could be related to a RAD exacerbation though this is less likely with absence of wheezing. I called the patients mother to advise of the CXR results and had a discussion of treatment options, steroids for possible RAD component vs observing/supportive care. We decided on observation/supportive care at this time. To continue albuterol and chest PT. I discussed signs to look for to necessitate an ED evaluation. Patient is to follow-up in clinic in 3 days for repeat evaluation.

## 2013-12-13 ENCOUNTER — Telehealth: Payer: Self-pay | Admitting: Family Medicine

## 2013-12-13 NOTE — Telephone Encounter (Signed)
Will forward to MD to see if these orders were faxed. Lusero Nordlund,CMA

## 2013-12-13 NOTE — Telephone Encounter (Signed)
Mother called and wanted to make sure that the suction [facility] [phone]. jw

## 2013-12-15 MED ORDER — OROPHARYNGEAL SUCTION CATHETER MT MISC: 1.0000 | Freq: Every day | OROMUCOSAL | Status: AC | PRN

## 2013-12-15 NOTE — Telephone Encounter (Signed)
Prescription placed in the to fax box.

## 2013-12-23 ENCOUNTER — Encounter: Payer: Self-pay | Admitting: Family Medicine

## 2013-12-23 NOTE — Progress Notes (Signed)
Will forward to MD to place order for oxygen. Melik Blancett,CMA

## 2013-12-23 NOTE — Progress Notes (Signed)
Mother came by and signed a release of information and is needing a signed medication list sent to [facility].  She is also needing a signed order sent for oxygen to be given nightly as needed.  She would like this faxed to [phone].

## 2013-12-26 ENCOUNTER — Other Ambulatory Visit: Payer: Self-pay | Admitting: Family Medicine

## 2013-12-26 NOTE — Progress Notes (Signed)
Faxed in hand written prescription for home oxygen.  Todd Degreealeb M. Jimmey RalphParker, MD [facility] Resident PGY-1 12/26/2013 4:39 PM

## 2013-12-28 ENCOUNTER — Telehealth: Payer: Self-pay | Admitting: Family

## 2013-12-28 NOTE — Telephone Encounter (Signed)
Todd BarrierJill [name] with [facility] called and said that they needed a list of his medications faxed. Send to her attention at fax # [phone]. List faxed as requested. TG

## 2014-01-23 ENCOUNTER — Encounter: Payer: Self-pay | Admitting: Family Medicine

## 2014-01-23 NOTE — Progress Notes (Signed)
Will forward to Dr. McKeag who is covering Dr. Parker's box. Jazmin Hartsell,CMA  

## 2014-01-23 NOTE — Progress Notes (Unsigned)
Mom stopped by and is needing a letter written and signed by the doctor stating her sons medical diagnosis.  She needs this for [employer].  Please call her when it is ready to be picked up.

## 2014-01-24 ENCOUNTER — Encounter: Payer: Self-pay | Admitting: Family Medicine

## 2014-01-24 NOTE — Progress Notes (Signed)
Hey dude. I got this. I must've overlooked it last week. I'm sorry!  Not sure what the [employer] is but it sounds like they might need someone who knows the patient. (mom is asking for a written letter signed by PCP stating "son's diagnosis")

## 2014-01-27 NOTE — Progress Notes (Signed)
LM for mom to call back.  Please ask for more clarification on what needs to be included in this letter.  I saw that it is for "[facility] [employer]".  We just want to make that we get the correct information to them.  Thanks Limited BrandsJazmin Jaidah Lomax,CMA

## 2014-02-03 ENCOUNTER — Other Ambulatory Visit: Payer: Self-pay | Admitting: Pediatrics

## 2014-02-03 ENCOUNTER — Telehealth: Payer: Self-pay | Admitting: Family

## 2014-02-03 NOTE — Telephone Encounter (Signed)
Letter has been put up front to be mailed out and I left a message informing mom. MB

## 2014-02-03 NOTE — Telephone Encounter (Addendum)
Mom Mancel BaleBecky Mccreadie left a message about Ivin BootyJoshua. She wants a letter listing his diagnoses and any explanations necessary. She said that she needs it for the [facility]. She said to address letter to Attention: [place] [employer]. Please mail the letter to Mom when ready. Mom can be reached at [phone]. The letter is written and ready for Dr Hickling's signature. I will mail it when signed. TG

## 2014-02-03 NOTE — Telephone Encounter (Signed)
[name], this letter is on your desk, thank you.

## 2014-03-26 ENCOUNTER — Other Ambulatory Visit: Payer: Self-pay | Admitting: Pediatrics

## 2014-03-30 ENCOUNTER — Encounter: Payer: Self-pay | Admitting: Family Medicine

## 2014-03-30 ENCOUNTER — Ambulatory Visit (INDEPENDENT_AMBULATORY_CARE_PROVIDER_SITE_OTHER): Payer: BC Managed Care – PPO | Admitting: Family Medicine

## 2014-03-30 VITALS — BP 104/73 | HR 89 | Temp 97.5°F

## 2014-03-30 DIAGNOSIS — N5089 Other specified disorders of the male genital organs: Secondary | ICD-10-CM

## 2014-03-30 DIAGNOSIS — N508 Other specified disorders of male genital organs: Secondary | ICD-10-CM

## 2014-03-30 DIAGNOSIS — G809 Cerebral palsy, unspecified: Secondary | ICD-10-CM

## 2014-03-30 DIAGNOSIS — G808 Other cerebral palsy: Secondary | ICD-10-CM

## 2014-03-30 NOTE — Progress Notes (Signed)
   Subjective:    Patient ID: [name], male    DOB: 04/13/1990, 24 y.o.   MRN: [id number]  HPI: Pt presents to clinic, brought in by father, for scrotal ulcer. Father states pt has had an ulcer on his scrotum for about 1-1.5 weeks. It does not appear red around it and has not been draining but it does have some irritated skin around it and has had some oozing bleeding. He has had similar ulcers before, but never on his scrotum (previous ulcers have been on his buttocks). Father has used zinc barrier cream and thinks the ulcer has not been getting any bigger, but neither is it healing.  Pt has a history of spasticity / congenital quadriplegia secondary to cerebral palsy and is wheel-chair bound. He is dependent for all transfers and repositioning. He uses an air mattress / gel mattress but it has "started to break down," per father, and he requests referral to physical therapy and letter stating medical need for new air mattress.  Review of Systems: As above. No fevers, no new exposures, no active vomiting or diarrhea, no increased urination or foul urinary odor     Objective:   Physical Exam BP 104/73 mmHg  Pulse 89  Temp(Src) 97.5 F (36.4 C) (Oral)  Wt  Gen: thin young adult male with marked contractures, awake and in NAD Cardio: RRR, no murmur appreciated Pulm: CTAB, normal WOB Abd: soft, nontender, BS+; J-tube in place Ext: warm, well-perfused; marked spasticity deformities bilaterally Skin: scrotal ulcer to left posterior / inferior area of scrotum approximately 1 cm from juncture of scrotum and perineum  ulcer approximately 0.5-0.75 cm in diameter, unstageable with yellowish slough present  no frank drainage or odor, mild surrounding skin irritation with blood on gauze dressing     Assessment & Plan:  24yo male with hx of cerebral palsy / congenital quadriplegia with new scrotal ulcer without apparent infection - overall consistent with pressure ulcer; recommended use of  rolled wash cloth or similar contrivance to elevated scrotum off bed - advised father in particular to arrange pt in such a way that scrotum can't be pinched between bedding and legs - provided father with [employer] dressings and instructions on proper use - referred to physical therapy and letter written recommending new air mattress - instructed father to bring pt back in 1-2 weeks for re-eval; if worsening ulcer at that time (or before) will likely need referral to wound care center - f/u with Dr. Jimmey RalphParker otherwise as needed  Note FYI to Dr. Jimmey RalphParker.  Bobbye Mortonhristopher M Rhonda Linan, MD PGY-3, [facility] 03/30/2014, 3:14 PM

## 2014-03-30 NOTE — Patient Instructions (Signed)
Thank you for coming in, today!  [name]'s ulcer does not look infected. I will give you a couple of dressings to try. Clean the skin well and pat it dry. Use the dressing to cover the wound completely. It can stay in place for a few days. Use a wash cloth or something similar to elevate his scrotum to take pressure off the area. Come back to see Dr. Jimmey RalphParker in about 1-2 weeks. If it is worse or getting bigger, we will send you to the wound center. If you have any other concerns, call or come back sooner rather than later.  Please feel free to call with any questions or concerns at any time, at [phone]. --Dr. Casper HarrisonStreet

## 2014-04-10 ENCOUNTER — Ambulatory Visit: Payer: BC Managed Care – PPO | Admitting: Family Medicine

## 2014-04-22 ENCOUNTER — Inpatient Hospital Stay (HOSPITAL_COMMUNITY)
Admission: EM | Admit: 2014-04-22 | Discharge: 2014-04-25 | DRG: 871 | Disposition: A | Discharge status: Home / Self Care | Payer: BC Managed Care – PPO | Attending: Family Medicine | Admitting: Family Medicine

## 2014-04-22 ENCOUNTER — Emergency Department (HOSPITAL_COMMUNITY): Payer: BC Managed Care – PPO

## 2014-04-22 ENCOUNTER — Telehealth: Payer: Self-pay | Admitting: Family Medicine

## 2014-04-22 ENCOUNTER — Encounter (HOSPITAL_COMMUNITY): Payer: Self-pay | Admitting: Emergency Medicine

## 2014-04-22 DIAGNOSIS — Z882 Allergy status to sulfonamides status: Secondary | ICD-10-CM

## 2014-04-22 DIAGNOSIS — J690 Pneumonitis due to inhalation of food and vomit: Secondary | ICD-10-CM | POA: Diagnosis present

## 2014-04-22 DIAGNOSIS — A419 Sepsis, unspecified organism: Secondary | ICD-10-CM | POA: Diagnosis present

## 2014-04-22 DIAGNOSIS — G8250 Quadriplegia, unspecified: Secondary | ICD-10-CM | POA: Diagnosis present

## 2014-04-22 DIAGNOSIS — E871 Hypo-osmolality and hyponatremia: Secondary | ICD-10-CM | POA: Diagnosis present

## 2014-04-22 DIAGNOSIS — F79 Unspecified intellectual disabilities: Secondary | ICD-10-CM

## 2014-04-22 DIAGNOSIS — Z993 Dependence on wheelchair: Secondary | ICD-10-CM | POA: Diagnosis not present

## 2014-04-22 DIAGNOSIS — F72 Severe intellectual disabilities: Secondary | ICD-10-CM | POA: Diagnosis present

## 2014-04-22 DIAGNOSIS — R05 Cough: Secondary | ICD-10-CM | POA: Diagnosis not present

## 2014-04-22 DIAGNOSIS — H540 Blindness, both eyes: Secondary | ICD-10-CM | POA: Diagnosis present

## 2014-04-22 DIAGNOSIS — G4733 Obstructive sleep apnea (adult) (pediatric): Secondary | ICD-10-CM | POA: Diagnosis present

## 2014-04-22 DIAGNOSIS — G40419 Other generalized epilepsy and epileptic syndromes, intractable, without status epilepticus: Secondary | ICD-10-CM | POA: Diagnosis present

## 2014-04-22 DIAGNOSIS — G808 Other cerebral palsy: Secondary | ICD-10-CM | POA: Diagnosis present

## 2014-04-22 DIAGNOSIS — E861 Hypovolemia: Secondary | ICD-10-CM | POA: Diagnosis present

## 2014-04-22 DIAGNOSIS — G809 Cerebral palsy, unspecified: Secondary | ICD-10-CM | POA: Diagnosis not present

## 2014-04-22 DIAGNOSIS — E872 Acidosis: Secondary | ICD-10-CM | POA: Diagnosis present

## 2014-04-22 DIAGNOSIS — R6520 Severe sepsis without septic shock: Secondary | ICD-10-CM | POA: Diagnosis not present

## 2014-04-22 DIAGNOSIS — Z934 Other artificial openings of gastrointestinal tract status: Secondary | ICD-10-CM

## 2014-04-22 DIAGNOSIS — J45909 Unspecified asthma, uncomplicated: Secondary | ICD-10-CM | POA: Diagnosis present

## 2014-04-22 DIAGNOSIS — J90 Pleural effusion, not elsewhere classified: Secondary | ICD-10-CM | POA: Diagnosis present

## 2014-04-22 DIAGNOSIS — K219 Gastro-esophageal reflux disease without esophagitis: Secondary | ICD-10-CM | POA: Diagnosis present

## 2014-04-22 DIAGNOSIS — R059 Cough, unspecified: Secondary | ICD-10-CM

## 2014-04-22 DIAGNOSIS — J189 Pneumonia, unspecified organism: Secondary | ICD-10-CM | POA: Diagnosis present

## 2014-04-22 DIAGNOSIS — H9190 Unspecified hearing loss, unspecified ear: Secondary | ICD-10-CM | POA: Diagnosis present

## 2014-04-22 DIAGNOSIS — Z79899 Other long term (current) drug therapy: Secondary | ICD-10-CM | POA: Diagnosis not present

## 2014-04-22 LAB — URINALYSIS, ROUTINE W REFLEX MICROSCOPIC
Bilirubin Urine: NEGATIVE
Glucose, UA: NEGATIVE mg/dL
Hgb urine dipstick: NEGATIVE
Ketones, ur: NEGATIVE mg/dL
Leukocytes, UA: NEGATIVE
NITRITE: NEGATIVE
Protein, ur: NEGATIVE mg/dL
SPECIFIC GRAVITY, URINE: 1.015 (ref 1.005–1.030)
Urobilinogen, UA: 2 mg/dL — ABNORMAL HIGH (ref 0.0–1.0)
pH: 7.5 (ref 5.0–8.0)

## 2014-04-22 LAB — COMPREHENSIVE METABOLIC PANEL
ALBUMIN: 3.1 g/dL — AB (ref 3.5–5.2)
ALK PHOS: 72 U/L (ref 39–117)
ALT: 12 U/L (ref 0–53)
ANION GAP: 8 (ref 5–15)
AST: 24 U/L (ref 0–37)
BILIRUBIN TOTAL: 0.5 mg/dL (ref 0.3–1.2)
BUN: 8 mg/dL (ref 6–23)
CALCIUM: 8.9 mg/dL (ref 8.4–10.5)
CO2: 24 mmol/L (ref 19–32)
Chloride: 99 mmol/L (ref 96–112)
Glucose, Bld: 90 mg/dL (ref 70–99)
POTASSIUM: 3.7 mmol/L (ref 3.5–5.1)
SODIUM: 131 mmol/L — AB (ref 135–145)
TOTAL PROTEIN: 6.8 g/dL (ref 6.0–8.3)

## 2014-04-22 LAB — CBC WITH DIFFERENTIAL/PLATELET
BASOS PCT: 0 % (ref 0–1)
Basophils Absolute: 0 10*3/uL (ref 0.0–0.1)
EOS ABS: 0.1 10*3/uL (ref 0.0–0.7)
EOS PCT: 1 % (ref 0–5)
HCT: 42.8 % (ref 39.0–52.0)
Hemoglobin: 14.4 g/dL (ref 13.0–17.0)
LYMPHS ABS: 1.3 10*3/uL (ref 0.7–4.0)
Lymphocytes Relative: 10 % — ABNORMAL LOW (ref 12–46)
MCH: 31.2 pg (ref 26.0–34.0)
MCHC: 33.6 g/dL (ref 30.0–36.0)
MCV: 92.6 fL (ref 78.0–100.0)
MONO ABS: 2.2 10*3/uL — AB (ref 0.1–1.0)
Monocytes Relative: 17 % — ABNORMAL HIGH (ref 3–12)
Neutro Abs: 9.7 10*3/uL — ABNORMAL HIGH (ref 1.7–7.7)
Neutrophils Relative %: 72 % (ref 43–77)
Platelets: 164 10*3/uL (ref 150–400)
RBC: 4.62 MIL/uL (ref 4.22–5.81)
RDW: 13.3 % (ref 11.5–15.5)
WBC: 13.3 10*3/uL — AB (ref 4.0–10.5)

## 2014-04-22 LAB — I-STAT CG4 LACTIC ACID, ED
LACTIC ACID, VENOUS: 2.68 mmol/L — AB (ref 0.5–2.0)
Lactic Acid, Venous: 2.01 mmol/L (ref 0.5–2.0)

## 2014-04-22 LAB — INFLUENZA PANEL BY PCR (TYPE A & B)
H1N1 flu by pcr: NOT DETECTED
INFLAPCR: NEGATIVE
Influenza B By PCR: NEGATIVE

## 2014-04-22 LAB — LACTIC ACID, PLASMA: Lactic Acid, Venous: 2.9 mmol/L (ref 0.5–2.0)

## 2014-04-22 LAB — PROCALCITONIN: Procalcitonin: <0.1 ng/mL

## 2014-04-22 IMAGING — CR DG CHEST 1V PORT
1 series · 1 of 1 positions shown · non-contrast
Comparison: Chest x-ray of 12/21/2011

CLINICAL DATA: Rest, history of asthma, seizure, cerebral palsy

PORTABLE CHEST - 1 VIEW

[AP]
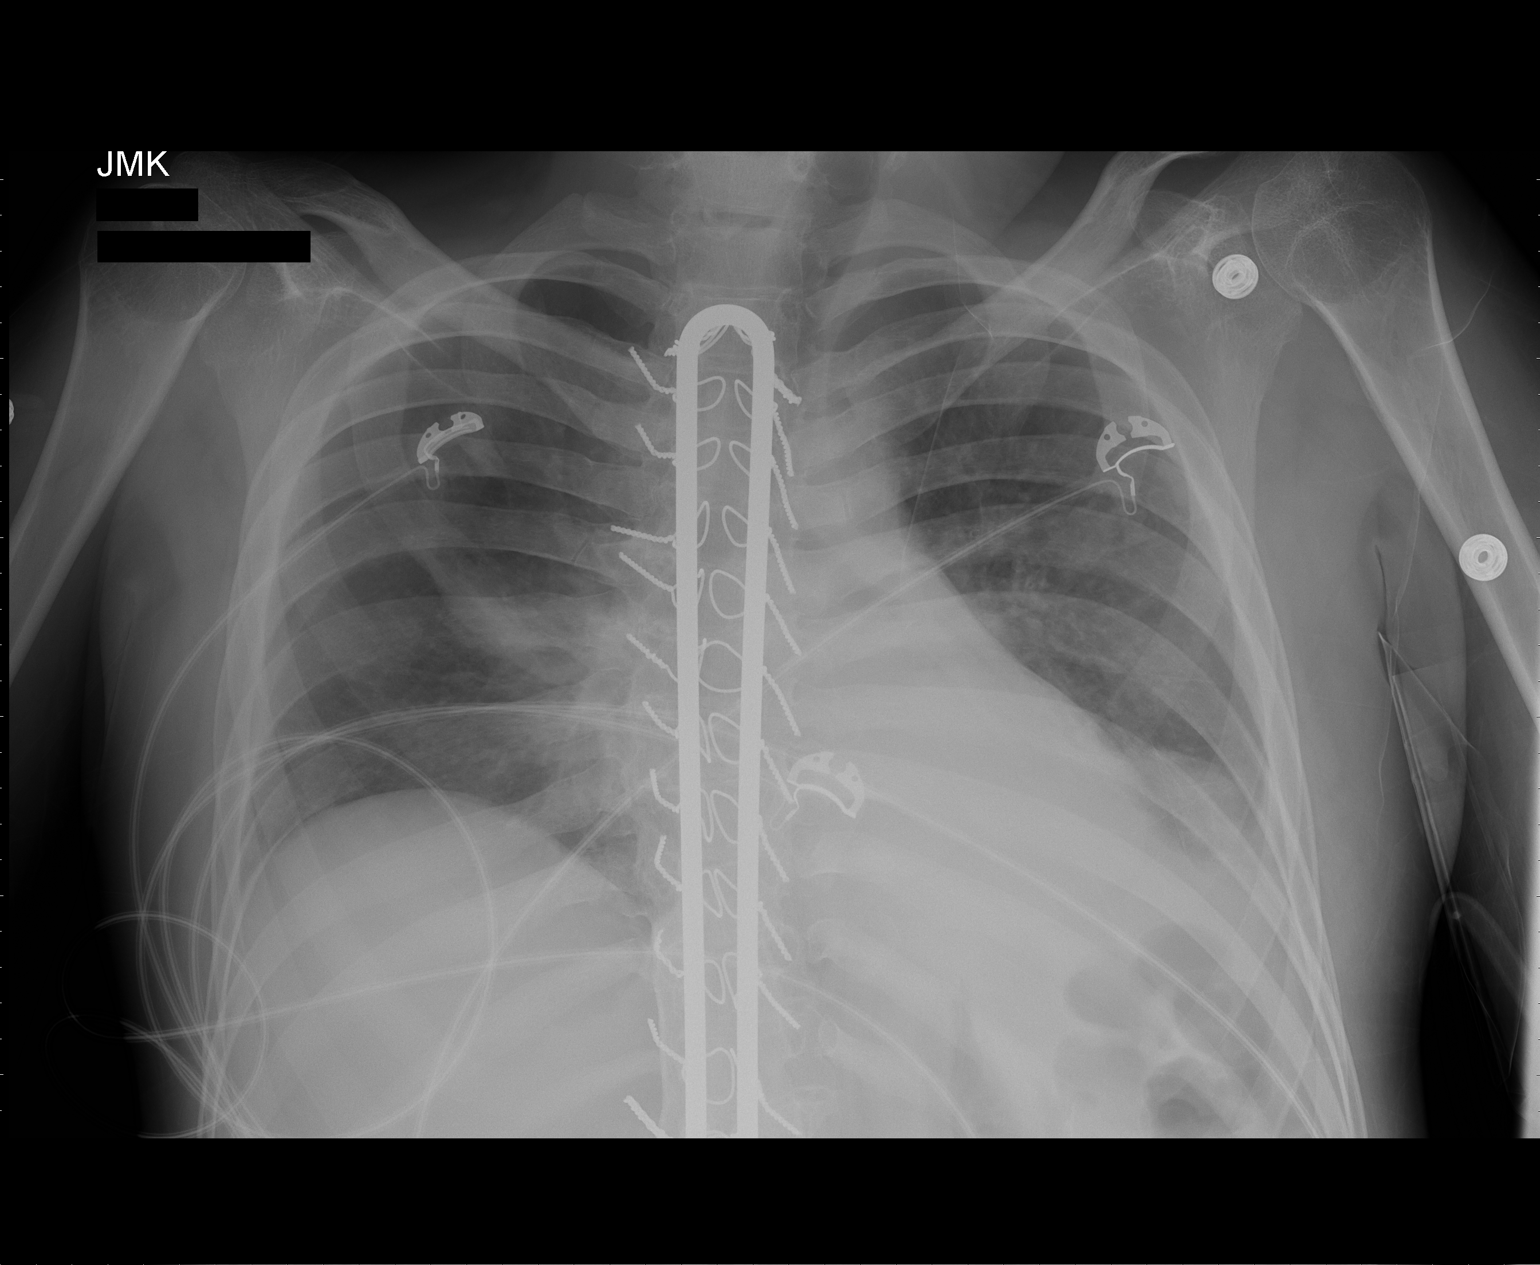

[1 of 1 positions shown; findings below may reference images not displayed]

FINDINGS: Opacities at both lung bases have increased, particularly
at the left lung base, most consistent with worsening of pneumonia.
Heart size is stable.  Fixation of the thoracic spine is noted with
hardware present.
IMPRESSION: Worsening of basilar opacities left greater than right most
consistent with pneumonia.

## 2014-04-22 MED ORDER — OROPHARYNGEAL SUCTION CATHETER MT MISC: 1.0000 | Freq: Every day | OROMUCOSAL | Status: DC | PRN

## 2014-04-22 MED ORDER — IBUPROFEN 200 MG PO TABS
600.0000 mg | ORAL_TABLET | Freq: Four times a day (QID) | ORAL | Status: DC | PRN
  Administered 2014-04-22 – 2014-04-23 (×2): 600 mg
  Filled 2014-04-22 (×2): qty 3

## 2014-04-22 MED ORDER — DIVALPROEX SODIUM 125 MG PO CPSP
375.0000 mg | ORAL_CAPSULE | Freq: Two times a day (BID) | ORAL | Status: DC
  Filled 2014-04-22: qty 3

## 2014-04-22 MED ORDER — SODIUM CHLORIDE 0.9 % IV SOLN
INTRAVENOUS | Status: DC
  Administered 2014-04-22: 100 mL via INTRAVENOUS
  Administered 2014-04-23 – 2014-04-25 (×2): via INTRAVENOUS

## 2014-04-22 MED ORDER — JEVITY 1.2 CAL PO LIQD
1000.0000 mL | ORAL | Status: DC
  Administered 2014-04-22 – 2014-04-24 (×2): 1000 mL
  Filled 2014-04-22 (×3): qty 1000
  Filled 2014-04-22: qty 237
  Filled 2014-04-22 (×2): qty 1000
  Filled 2014-04-22: qty 237

## 2014-04-22 MED ORDER — GABAPENTIN 300 MG PO CAPS
300.0000 mg | ORAL_CAPSULE | ORAL | Status: DC
  Administered 2014-04-22 – 2014-04-25 (×9): 300 mg
  Filled 2014-04-22 (×11): qty 1

## 2014-04-22 MED ORDER — NYSTATIN 100000 UNIT/GM EX CREA
TOPICAL_CREAM | Freq: Two times a day (BID) | CUTANEOUS | Status: DC | PRN
  Filled 2014-04-22: qty 15

## 2014-04-22 MED ORDER — DIVALPROEX SODIUM 125 MG PO CPSP: 1000.0000 mg | ORAL_CAPSULE | Freq: Every day | ORAL | Status: DC

## 2014-04-22 MED ORDER — PIPERACILLIN-TAZOBACTAM 3.375 G IVPB
3.3750 g | Freq: Three times a day (TID) | INTRAVENOUS | Status: DC
  Administered 2014-04-22 – 2014-04-24 (×5): 3.375 g via INTRAVENOUS
  Filled 2014-04-22 (×7): qty 50

## 2014-04-22 MED ORDER — GLYCOPYRROLATE 1 MG PO TABS
2.0000 mg | ORAL_TABLET | Freq: Two times a day (BID) | ORAL | Status: DC
  Filled 2014-04-22: qty 2

## 2014-04-22 MED ORDER — GABAPENTIN 300 MG PO CAPS
300.0000 mg | ORAL_CAPSULE | Freq: Three times a day (TID) | ORAL | Status: DC
  Filled 2014-04-22: qty 1

## 2014-04-22 MED ORDER — BACLOFEN 10 MG PO TABS
20.0000 mg | ORAL_TABLET | ORAL | Status: DC
  Administered 2014-04-22 – 2014-04-25 (×9): 20 mg
  Filled 2014-04-22: qty 1
  Filled 2014-04-22 (×2): qty 2
  Filled 2014-04-22 (×3): qty 1
  Filled 2014-04-22: qty 2
  Filled 2014-04-22 (×2): qty 1
  Filled 2014-04-22: qty 2
  Filled 2014-04-22: qty 1

## 2014-04-22 MED ORDER — SODIUM CHLORIDE 0.9 % IV BOLUS (SEPSIS)
500.0000 mL | INTRAVENOUS | Status: AC
  Administered 2014-04-22: 500 mL via INTRAVENOUS

## 2014-04-22 MED ORDER — ALBUTEROL SULFATE (2.5 MG/3ML) 0.083% IN NEBU
2.5000 mg | INHALATION_SOLUTION | Freq: Four times a day (QID) | RESPIRATORY_TRACT | Status: DC | PRN
  Administered 2014-04-24: 2.5 mg via RESPIRATORY_TRACT
  Filled 2014-04-22: qty 3

## 2014-04-22 MED ORDER — GERHARDT'S BUTT CREAM
1.0000 "application " | TOPICAL_CREAM | Freq: Two times a day (BID) | CUTANEOUS | Status: DC | PRN
  Filled 2014-04-22: qty 1

## 2014-04-22 MED ORDER — SODIUM CHLORIDE 0.9 % IV BOLUS (SEPSIS)
1000.0000 mL | Freq: Once | INTRAVENOUS | Status: AC
  Administered 2014-04-22: 1000 mL via INTRAVENOUS

## 2014-04-22 MED ORDER — DIVALPROEX SODIUM 125 MG PO CPSP
375.0000 mg | ORAL_CAPSULE | ORAL | Status: DC
  Administered 2014-04-22 – 2014-04-25 (×6): 375 mg via ORAL
  Filled 2014-04-22 (×10): qty 3

## 2014-04-22 MED ORDER — KANGAROO JOEY ENTERAL PUMP MISC
30.0000 [IU] | Freq: Every day | Status: DC
  Administered 2014-04-22: 30 [IU]

## 2014-04-22 MED ORDER — BACLOFEN 20 MG PO TABS
20.0000 mg | ORAL_TABLET | Freq: Three times a day (TID) | ORAL | Status: DC
  Filled 2014-04-22: qty 1

## 2014-04-22 MED ORDER — GLYCOPYRROLATE 1 MG PO TABS
2.0000 mg | ORAL_TABLET | ORAL | Status: DC
  Administered 2014-04-22 – 2014-04-25 (×6): 2 mg
  Filled 2014-04-22 (×10): qty 2

## 2014-04-22 MED ORDER — DEXTROSE 5 % IV SOLN: 1.0000 g | INTRAVENOUS | Status: DC

## 2014-04-22 MED ORDER — BACLOFEN 20 MG PO TABS: 20.0000 mg | ORAL_TABLET | Freq: Three times a day (TID) | ORAL | Status: DC

## 2014-04-22 MED ORDER — DEXTROSE 5 % IV SOLN
500.0000 mg | INTRAVENOUS | Status: DC
  Filled 2014-04-22: qty 500

## 2014-04-22 MED ORDER — DIVALPROEX SODIUM 125 MG PO CPSP
250.0000 mg | ORAL_CAPSULE | Freq: Every day | ORAL | Status: DC
  Filled 2014-04-22: qty 2

## 2014-04-22 MED ORDER — DIVALPROEX SODIUM 125 MG PO CPSP: 1500.0000 mg | ORAL_CAPSULE | Freq: Two times a day (BID) | ORAL | Status: DC

## 2014-04-22 MED ORDER — OMEPRAZOLE 2 MG/ML ORAL SUSPENSION
20.0000 mg | Freq: Every day | ORAL | Status: DC
  Administered 2014-04-23 – 2014-04-25 (×3): 20 mg via ORAL
  Filled 2014-04-22 (×5): qty 10

## 2014-04-22 MED ORDER — DEXTROSE 5 % IV SOLN
1.0000 g | Freq: Once | INTRAVENOUS | Status: AC
  Administered 2014-04-22: 1 g via INTRAVENOUS
  Filled 2014-04-22: qty 10

## 2014-04-22 MED ORDER — DEXTROSE 5 % IV SOLN
500.0000 mg | Freq: Once | INTRAVENOUS | Status: AC
  Administered 2014-04-22: 500 mg via INTRAVENOUS
  Filled 2014-04-22: qty 500

## 2014-04-22 NOTE — ED Notes (Signed)
NOTIFIED A. HARRIS ,PAC FOR PATIENTS LAB RSULTS OF CG4+LACTIC ACID @15 :27 PM ,04/22/2014.

## 2014-04-22 NOTE — Progress Notes (Signed)
ANTIBIOTIC CONSULT NOTE - INITIAL  Pharmacy Consult for Zosyn Indication: r/o aspiration PNA  Allergies  Allergen Reactions  . Sulfamethoxazole-Trimethoprim Rash  . Topamax [Topiramate]     Patient Measurements: Weight: 87 lb 6.4 oz (39.644 kg)  Vital Signs: Temp: 95.6 F (35.3 C) (02/27 2040) Temp Source: Axillary (02/27 2040) BP: 105/65 mmHg (02/27 2040) Pulse Rate: 87 (02/27 2040) Intake/Output from previous day:   Intake/Output from this shift:    Labs:  Recent Labs  04/22/14 1310  WBC 13.3*  HGB 14.4  PLT 164  CREATININE <0.30*   CrCl cannot be calculated (Unknown ideal weight.). No results for input(s): VANCOTROUGH, VANCOPEAK, VANCORANDOM, GENTTROUGH, GENTPEAK, GENTRANDOM, TOBRATROUGH, TOBRAPEAK, TOBRARND, AMIKACINPEAK, AMIKACINTROU, AMIKACIN in the last 72 hours.   Microbiology: No results found for this or any previous visit (from the past 720 hour(s)).  Medical History: Past Medical History  Diagnosis Date  . Allergy   . Asthma   . Cerebral palsy, quadriplegic   . Congenital CMV infection   . Neuromuscular disorder     cp  . H/O hiatal hernia   . Pneumonia     "he's had it several times; maybe now" (12/17/2011)  . Community acquired pneumonia 06/30/2011    Psuedomonas PNA 06/2011 >tx 21 d of Cipro  CXR 08/15/11 >clearance of PNA    . GERD (gastroesophageal reflux disease)   . Seizures     "has had gran mals before" (12/17/2011)  . Cytomegalovirus   . On home oxygen therapy     "at night" (12/17/2011"  . Jejunostomy tube present   . Acute and chronic respiratory failure 12/24/2011    Assessment: 23 YOM to switch from Cetriaxone to Zosyn along with Azithromycin for empiric coverage for CAP and aspiration PNA. The patient is a quadraplegic so renal function is hard to estimate - will monitor urine output.  Goal of Therapy:  Proper antibiotics for infection/cultures adjusted for renal/hepatic function   Plan:  1. Start Zosyn 3.375g IV every 8  hours (infused over 4 hours) 2. Will continue to follow renal function, culture results, LOT, and antibiotic de-escalation plans   Georgina PillionElizabeth Alejandro Adcox, PharmD, BCPS Clinical Pharmacist Pager: [phone] 04/22/2014 8:59 PM

## 2014-04-22 NOTE — Progress Notes (Deleted)
ANTIBIOTIC CONSULT NOTE - INITIAL  Pharmacy Consult for Zithromax/Ceftriaxone Indication: r/o PNA  Allergies  Allergen Reactions  . Sulfamethoxazole-Trimethoprim Rash  . Topamax [Topiramate]    Patient Measurements: Weight: 88 lb 2.9 oz (40 kg)  Vital Signs: Temp: 100.5 F (38.1 C) (02/27 1402) Temp Source: Rectal (02/27 1402) BP: 83/50 mmHg (02/27 1500) Pulse Rate: 99 (02/27 1430)  Labs:  Recent Labs  04/22/14 1310  WBC 13.3*  HGB 14.4  PLT 164  CREATININE <0.30*   CrCl cannot be calculated (Unknown ideal weight.). No results for input(s): VANCOTROUGH, VANCOPEAK, VANCORANDOM, GENTTROUGH, GENTPEAK, GENTRANDOM, TOBRATROUGH, TOBRAPEAK, TOBRARND, AMIKACINPEAK, AMIKACINTROU, AMIKACIN in the last 72 hours.   Microbiology: No results found for this or any previous visit (from the past 720 hour(s)).  Medical History: Past Medical History  Diagnosis Date  . Allergy   . Asthma   . Cerebral palsy, quadriplegic   . Congenital CMV infection   . Neuromuscular disorder     cp  . H/O hiatal hernia   . Pneumonia     "he's had it several times; maybe now" (12/17/2011)  . Community acquired pneumonia 06/30/2011    Psuedomonas PNA 06/2011 >tx 21 d of Cipro  CXR 08/15/11 >clearance of PNA    . GERD (gastroesophageal reflux disease)   . Seizures     "has had gran mals before" (12/17/2011)  . Cytomegalovirus   . On home oxygen therapy     "at night" (12/17/2011"  . Jejunostomy tube present   . Acute and chronic respiratory failure 12/24/2011    Medications:  Anti-infectives    Start     Dose/Rate Route Frequency Ordered Stop   04/23/14 1600  cefTRIAXone (ROCEPHIN) 1 g in dextrose 5 % 50 mL IVPB     1 g 100 mL/hr over 30 Minutes Intravenous Every 24 hours 04/22/14 1542     04/23/14 1600  azithromycin (ZITHROMAX) 500 mg in dextrose 5 % 250 mL IVPB     500 mg 250 mL/hr over 60 Minutes Intravenous Every 24 hours 04/22/14 1542     04/22/14 1430  cefTRIAXone (ROCEPHIN) 1 g in  dextrose 5 % 50 mL IVPB     1 g 100 mL/hr over 30 Minutes Intravenous  Once 04/22/14 1422 04/22/14 1544   04/22/14 1430  azithromycin (ZITHROMAX) 500 mg in dextrose 5 % 250 mL IVPB     500 mg 250 mL/hr over 60 Minutes Intravenous  Once 04/22/14 1422       Assessment: 24yo male with cerebral palsy is admitted with c/o shivering, fever and chills.  We have been asked to dose his IV antibiotics.  Goal of Therapy:  Therapeutic response to IV antibiotics  Plan:  Rocephin 1gm IV daily Zithromax 500mg  IV daily F/U culture data, clinical response   Nadara MustardNita Cassie Shedlock, PharmD., MS Clinical Pharmacist Pager:  [phone] Thank you for allowing pharmacy to be part of this patients care team. 04/22/2014,3:41 PM

## 2014-04-22 NOTE — ED Notes (Signed)
NOTIFIED A. HARRIS ,PAC FOR PATIENTS LAB RESULTS OF CG+ LACTIC ACID @17 :55PM ,04/22/2014.

## 2014-04-22 NOTE — Progress Notes (Signed)
ANTIBIOTIC CONSULT NOTE - INITIAL  Pharmacy Consult for Azithromycin and Rocephin Indication: CAP  Allergies  Allergen Reactions  . Sulfamethoxazole-Trimethoprim Rash  . Topamax [Topiramate]     Patient Measurements: Weight: 88 lb 2.9 oz (40 kg)  Vital Signs: Temp: 100.5 F (38.1 C) (02/27 1402) Temp Source: Rectal (02/27 1402) BP: 83/50 mmHg (02/27 1500) Pulse Rate: 99 (02/27 1430) Intake/Output from previous day:   Intake/Output from this shift:    Labs:  Recent Labs  04/22/14 1310  WBC 13.3*  HGB 14.4  PLT 164  CREATININE <0.30*   CrCl cannot be calculated (Unknown ideal weight.). No results for input(s): VANCOTROUGH, VANCOPEAK, VANCORANDOM, GENTTROUGH, GENTPEAK, GENTRANDOM, TOBRATROUGH, TOBRAPEAK, TOBRARND, AMIKACINPEAK, AMIKACINTROU, AMIKACIN in the last 72 hours.   Microbiology: No results found for this or any previous visit (from the past 720 hour(s)).  Medical History: Past Medical History  Diagnosis Date  . Allergy   . Asthma   . Cerebral palsy, quadriplegic   . Congenital CMV infection   . Neuromuscular disorder     cp  . H/O hiatal hernia   . Pneumonia     "he's had it several times; maybe now" (12/17/2011)  . Community acquired pneumonia 06/30/2011    Psuedomonas PNA 06/2011 >tx 21 d of Cipro  CXR 08/15/11 >clearance of PNA    . GERD (gastroesophageal reflux disease)   . Seizures     "has had gran mals before" (12/17/2011)  . Cytomegalovirus   . On home oxygen therapy     "at night" (12/17/2011"  . Jejunostomy tube present   . Acute and chronic respiratory failure 12/24/2011    Medications:  See electronic med rec  Assessment: 24 y.o. male presents with fever and cough. Pharmacy asked to dose Rocephin and Azithromycin for CAP. Tm 100.5. WBC elevated to 13.3.  Goal of Therapy:  Resolution of infection  Plan:  1. Rocephin 1gm IV q24h 2. Azithromycin 500mg  IV q24h 3. Pharmacy will sign off as these meds do not require adjustments -  please reconsult if needed.  Christoper Fabianaron Angelena Sand, PharmD, BCPS Clinical pharmacist, pager [phone] 04/22/2014,3:43 PM

## 2014-04-22 NOTE — ED Notes (Signed)
Attempted report to 2H x2. Waiting for response.

## 2014-04-22 NOTE — Telephone Encounter (Signed)
Mother called reporting increased work of breathing and fever. I advised her to take him to the ED for evaluation.

## 2014-04-22 NOTE — ED Provider Notes (Signed)
CSN: [id number]     Arrival date & time 04/22/14  1220 History   First MD Initiated Contact with Patient 04/22/14 1353     Chief Complaint  Patient presents with  . Fever  . Cough     (Consider location/radiation/quality/duration/timing/severity/associated sxs/prior Treatment) Patient is a 24 y.o. male presenting with fever and cough.  Fever Associated symptoms: cough and rash   Cough Associated symptoms: fever and rash      Level V caveat due to patient's mental status.   [name] B. Kelty Is a 24 year old male with a past medical history of congenital CMV, cerebral palsy, multiple admissions for community-acquired pneumonia, history of seizures, history of acute and chronic respiratory failure. He has baseline mental status deficits and is noncommunicative. Patient is brought in by his mother. The patient has had increasing cough over the past week. His mother states that he coughs for various reasons. This morning his breathing appeared labored, his coughing is more frequent. His mother checked his oxygen saturation and noticed that was in the 80s. The patient is normally on home O2 and she placed him on nasal oxygen. At that time. She has not noticed any other source of infections. She noticed also that he had rigors and flushed appearing face. She took a temperature and the patient's ear, which was 101 at the time. She reports he has been more somnolent over the past week.  Past Medical History  Diagnosis Date  . Allergy   . Asthma   . Cerebral palsy, quadriplegic   . Congenital CMV infection   . Neuromuscular disorder     cp  . H/O hiatal hernia   . Pneumonia     "he's had it several times; maybe now" (12/17/2011)  . Community acquired pneumonia 06/30/2011    Psuedomonas PNA 06/2011 >tx 21 d of Cipro  CXR 08/15/11 >clearance of PNA    . GERD (gastroesophageal reflux disease)   . Seizures     "has had gran mals before" (12/17/2011)  . Cytomegalovirus   . On home oxygen therapy      "at night" (12/17/2011"  . Jejunostomy tube present   . Acute and chronic respiratory failure 12/24/2011   Past Surgical History  Procedure Laterality Date  . Nissen fundoplication    . Rod  ~ 2003 "or before"    "in his back" (12/17/2011  . Peg tube placement    . Percutaneous gastrotomy tube replacement     Family History  Problem Relation Age of Onset  . Allergies Mother   . Heart disease Maternal Uncle   . Colon cancer Paternal Grandmother     Died at 9  . Diabetes Father    History  Substance Use Topics  . Smoking status: Never Smoker   . Smokeless tobacco: Never Used  . Alcohol Use: No    Review of Systems  Unable to perform ROS Constitutional: Positive for fever.  Respiratory: Positive for cough.   Skin: Positive for rash.       Pressure ulcer       Allergies  Sulfamethoxazole-trimethoprim and Topamax  Home Medications   Prior to Admission medications   Medication Sig Start Date End Date Taking? Authorizing Provider  albuterol (PROVENTIL) (2.5 MG/3ML) 0.083% nebulizer solution Take 3 mLs (2.5 mg total) by nebulization every 6 (six) hours as needed for wheezing or shortness of breath. 12/12/13   Glori Luis, MD  baclofen (LIORESAL) 10 MG tablet Crush 2 tablets and take by gastrostomy tube  3 times daily 10/21/13   Deetta Perla, MD  baclofen (LIORESAL) 10 MG tablet TAKE 2 TABLETS BY MOUTH 3 TIMES A DAY 10/22/13   Elveria Rising, NP  DEPAKOTE SPRINKLES 125 MG capsule TAKE 3 CAPSULES BY MOUTH EVERY MORNING TAKE 2 CAPSULES MIDDAY AND TAKE 3 CAPSULES AT BEDTIME 03/27/14   Elveria Rising, NP  DIASTAT ACUDIAL 10 MG GEL Give  rectally for seizures 2 minutes or longer 12/28/13   Elveria Rising, NP  Feeding Tubes - Pump (KANGAROO JOEY ENTERAL PUMP) MISC 30 Units by Does not apply route daily. Please provide bags for Kangroo Joey enteral feeding device. Should be changed daily. 10/20/11   Shelly Rubenstein, MD  gabapentin (NEURONTIN) 300 MG capsule  Take 1 capsule per gastrostomy tube 3 times daily 10/21/13   Deetta Perla, MD  gabapentin (NEURONTIN) 300 MG capsule TAKE ONE CAPSULE PER TUBE 3 TIMES DAILY 02/03/14   Elveria Rising, NP  glycopyrrolate (ROBINUL) 2 MG tablet 1 tablet per gastrostomy tube twice daily 10/21/13   Deetta Perla, MD  Hydrocortisone (GERHARDT'S BUTT CREAM) CREA Apply 1 application topically 2 (two) times daily as needed. Please provide remained of tube used in hospital 07/08/11   Andrena Mews, DO  Incontinence Supplies MISC May have incontinence supplies as determined by home health agency. 02/11/12   Shelly Rubenstein, MD  Nutritional Supplements (FEEDING SUPPLEMENT, JEVITY 1.2 CAL,) LIQD Place 1,000 mLs into feeding tube continuous. 05/07/12   Glori Luis, MD  nystatin cream (MYCOSTATIN) Apply topically 2 (two) times daily as needed (Redness). 01/05/12   Leona Singleton, MD  omeprazole (PRILOSEC) 2 mg/mL SUSP Take 10 mLs (20 mg total) by mouth daily at 12 noon. 11/14/13   Jacquiline Doe, MD  Oral Hygiene Products (OROPHARYNGEAL SUCTION CATHETER) MISC Use as directed 1 Device in the mouth or throat daily as needed. 12/15/13   Glori Luis, MD   BP 96/58 mmHg  Pulse 115  Temp(Src) 100.5 F (38.1 C) (Rectal)  Resp 19  Wt 88 lb 2.9 oz (40 kg)  SpO2 95% Physical Exam  Constitutional: No distress.  HENT:  Head: Normocephalic and atraumatic.  Mouth/Throat: Oropharynx is clear and moist.  Patient with flushed appearing face  Eyes: Conjunctivae are normal.  Cardiovascular:  Tachycardic  Pulmonary/Chest: Effort normal. He has no wheezes. He exhibits no tenderness.  Abdominal: Soft. He exhibits distension. There is no guarding.  PEG tube in place in the left upper quadrant without signs of infection  Genitourinary: Penis normal.     Skin: Skin is warm and dry.  Nursing note and vitals reviewed.    CRITICAL CARE Performed by: Arthor Captain   Total critical care time: 35  Critical  care time was exclusive of separately billable procedures and treating other patients.  Critical care was necessary to treat or prevent imminent or life-threatening deterioration.  Critical care was time spent personally by me on the following activities: development of treatment plan with patient and/or surrogate as well as nursing, discussions with consultants, evaluation of patient's response to treatment, examination of patient, obtaining history from patient or surrogate, ordering and performing treatments and interventions, ordering and review of laboratory studies, ordering and review of radiographic studies, pulse oximetry and re-evaluation of patient's condition. Care  ED Course  Procedures (including critical care time) Labs Review Labs Reviewed  CBC WITH DIFFERENTIAL/PLATELET - Abnormal; Notable for the following:    WBC 13.3 (*)    Neutro Abs 9.7 (*)  Lymphocytes Relative 10 (*)    Monocytes Relative 17 (*)    Monocytes Absolute 2.2 (*)    All other components within normal limits  COMPREHENSIVE METABOLIC PANEL - Abnormal; Notable for the following:    Sodium 131 (*)    Creatinine, Ser <0.30 (*)    Albumin 3.1 (*)    All other components within normal limits  CULTURE, BLOOD (ROUTINE X 2)  CULTURE, BLOOD (ROUTINE X 2)  URINE CULTURE  LACTIC ACID, PLASMA  INFLUENZA PANEL BY PCR (TYPE A & B, H1N1)  URINALYSIS, ROUTINE W REFLEX MICROSCOPIC  I-STAT CG4 LACTIC ACID, ED    Imaging Review No results found.   EKG Interpretation None       CRITICAL CARE Performed by: Arthor CaptainHarris, Auset Fritzler   Total critical care time: 30  Critical care time was exclusive of separately billable procedures and treating other patients.  Critical care was necessary to treat or prevent imminent or life-threatening deterioration.  Critical care was time spent personally by me on the following activities: development of treatment plan with patient and/or surrogate as well as nursing, discussions  with consultants, evaluation of patient's response to treatment, examination of patient, obtaining history from patient or surrogate, ordering and performing treatments and interventions, ordering and review of laboratory studies, ordering and review of radiographic studies, pulse oximetry and re-evaluation of patient's condition.   MDM   Final diagnoses:  Cough  Severe sepsis    2:52 PM Filed Vitals:   04/22/14 1314 04/22/14 1402 04/22/14 1410 04/22/14 1411  BP:   96/58   Pulse:    115  Temp:  100.5 F (38.1 C)    TempSrc:  Rectal    Resp:    19  Weight: 88 lb 2.9 oz (40 kg)     SpO2:    95%   This is a 24 y/o male with multiple chronic medical problems, seizures. Patient is febrile, tachycardic, hypotensive with a leukocytosis of 13,000. Level II code sepsis inititated. Weight based fluid resucutation at 30mg /kg initiated. I have ordered treatment for CAP with IV ceftriaxone and azithromycin.  Labs pending.  I have spoke with Dr. Gildardo CrankerBryan Hess with family medicine who will admit the patient      Arthor Captainbigail Chesnee Floren, PA-C 04/27/14 1026  Arby BarretteMarcy Pfeiffer, MD 04/27/14 [phone]

## 2014-04-22 NOTE — ED Notes (Signed)
Called Phlebotomy to get second set of Blood Cultures.

## 2014-04-22 NOTE — ED Notes (Signed)
Mother stated, He has had a cough and this morning he spike a fever of 101. I gave him some Motrin about a hour ago. He also started shaking. He is one that needs to be seen and not wait.

## 2014-04-22 NOTE — H&P (Signed)
Family Medicine Teaching [facility] Admission History and Physical Service Pager: [phone]  Patient name: [name]. [facility] Medical record number: [id number] Date of birth: Jun 09, 1990 Age: 24 y.o. Gender: male  Primary Care Provider: Jacquiline Doe, MD Consultants: None Code Status: Full   Chief Complaint: Cough   Assessment and Plan: [name] is a 25 y.o. male presenting with cough found to have LLL PNA . PMH is significant for MR/CP, recurrent PNA.   #Sepsis 2/2 CAP/Aspiration PNA and Lactic Acidosis - Pt with LLL/retrocardiac PNA with possible parapneumonic effusion - Admit to SDU, vitals per unit - F/U on BCx, UCx - 2 L bolus of NS - Switch to Zosyn for anaerobic coverage and continue Azithromycin (previously d/c on Clindamycin in 2014 for similar presentation).  No risk factors for HCAP at this time or MRSA but if no response, consider addition of Vancomycin  - Repeat CBC, BMET in AM - Albuterol, pulmonary toilet PRN, Pulmonary Physiotherapy  - O2 PRN for O2 sat < 92% - F/U procalitonin q 48 hrs, gram stain, Sputum Cx  #Leukocytosis w/ Neutrophilia - From above - repeat CBC in AM   # Hyponatremia - Most likely from hypovolemia hypotonic hyponatremia 2/2 decrease intake - F/U Na after re pleating intravascular depletion   FEN/GI: NS @ 100 cc/hr  Prophylaxis: SCDs  Disposition: SDU pending further improvement   History of Present Illness: [name] is a 24 y.o. male presenting with cough/fever found to have LLL PNA.  Pt is severe MR/CP taken care of by his family.  Pt's mother states he started having cough about one week ago, non productive, but did not have any other Sx.  However, he started to have worsening cough over this past week and developed lethargy about 2-3 days ago.  This AM, he started having fevers up to 101F and was not eating, so mother decided to bring him to the ED.  Denies recent infections, did receive influenza vaccine, denies vomiting,  diarrhea, or skin rashes.  In the ED, pt had extensive w/u including influenza PCR (pending), UA/UCx pending, Lactic acid of 2.4, Na of 131, WBC of 13.3 w/ absolute neutrophil count of 9.7.  Started on Rocephin/Azithro and given 1 L NS bolus.   Review Of Systems: Per HPI   Patient Active Problem List   Diagnosis Date Noted  . Scrotal ulcer 03/30/2014  . Cough 12/12/2013  . Hand edema 03/01/2013  . Seborrheic dermatitis 02/04/2013  . Rash of hands 08/24/2012  . Pressure ulcer, stage 2 08/24/2012  . 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 Encounter for long-term (current) use of other medications 08/18/2012  . Encounter for care related to feeding tube 06/16/2012  . Anemia 05/13/2012  . Thrombocytopenia, unspecified 05/13/2012  . Hypokalemia 05/13/2012  . Dysphagia 10/20/2011  . SLEEP APNEA, OBSTRUCTIVE 06/13/2009  . Wheelchair dependence 06/13/2009  . MENTAL RETARDATION 05/30/2009  . Infantile cerebral palsy 05/30/2009  . CORTICAL BLINDNESS 05/30/2009  . HEARING IMPAIRMENT 05/30/2009  . 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 SEIZURE DISORDER 05/30/2009   Past Medical History: Past Medical History  Diagnosis Date  . Allergy   . Asthma   . Cerebral palsy, quadriplegic   . Congenital CMV infection   . Neuromuscular disorder     cp  . H/O hiatal hernia   . Pneumonia     "he's had  it several times; maybe now" (12/17/2011)  . Community acquired pneumonia 06/30/2011    Psuedomonas PNA 06/2011 >tx 21 d of Cipro  CXR 08/15/11 >clearance of PNA    . GERD (gastroesophageal reflux disease)   . Seizures     "has had gran mals before" (12/17/2011)  . Cytomegalovirus   . On home oxygen therapy     "at night" (12/17/2011"  . Jejunostomy tube present   . Acute and chronic  respiratory failure 12/24/2011   Past Surgical History: Past Surgical History  Procedure Laterality Date  . Nissen fundoplication    . Rod  ~ 2003 "or before"    "in his back" (12/17/2011  . Peg tube placement    . Percutaneous gastrotomy tube replacement     Social History: History  Substance Use Topics  . Smoking status: Never Smoker   . Smokeless tobacco: Never Used  . Alcohol Use: No    Family History: Family History  Problem Relation Age of Onset  . Allergies Mother   . Heart disease Maternal Uncle   . Colon cancer Paternal Grandmother     Died at 8  . Diabetes Father    Allergies and Medications: Allergies  Allergen Reactions  . Sulfamethoxazole-Trimethoprim Rash  . Topamax [Topiramate]    No current facility-administered medications on file prior to encounter.   Current Outpatient Prescriptions on File Prior to Encounter  Medication Sig Dispense Refill  . albuterol (PROVENTIL) (2.5 MG/3ML) 0.083% nebulizer solution Take 3 mLs (2.5 mg total) by nebulization every 6 (six) hours as needed for wheezing or shortness of breath. 150 mL 1  . baclofen (LIORESAL) 10 MG tablet Crush 2 tablets and take by gastrostomy tube 3 times daily 186 tablet 5  . baclofen (LIORESAL) 10 MG tablet TAKE 2 TABLETS BY MOUTH 3 TIMES A DAY 186 tablet 5  . DEPAKOTE SPRINKLES 125 MG capsule TAKE 3 CAPSULES BY MOUTH EVERY MORNING TAKE 2 CAPSULES MIDDAY AND TAKE 3 CAPSULES AT BEDTIME 250 capsule 0  . DIASTAT ACUDIAL 10 MG GEL Give  rectally for seizures 2 minutes or longer 1 Package 3  . Feeding Tubes - Pump (KANGAROO JOEY ENTERAL PUMP) MISC 30 Units by Does not apply route daily. Please provide bags for Kangroo Joey enteral feeding device. Should be changed daily. 30 each 12  . gabapentin (NEURONTIN) 300 MG capsule Take 1 capsule per gastrostomy tube 3 times daily 93 capsule 5  . gabapentin (NEURONTIN) 300 MG capsule TAKE ONE CAPSULE PER TUBE 3 TIMES DAILY 90 capsule 5  . glycopyrrolate  (ROBINUL) 2 MG tablet 1 tablet per gastrostomy tube twice daily 62 tablet 5  . Hydrocortisone (GERHARDT'S BUTT CREAM) CREA Apply 1 application topically 2 (two) times daily as needed. Please provide remained of tube used in hospital    . Incontinence Supplies MISC May have incontinence supplies as determined by home health agency. 30 each 0  . Nutritional Supplements (FEEDING SUPPLEMENT, JEVITY 1.2 CAL,) LIQD Place 1,000 mLs into feeding tube continuous.    Marland Kitchen nystatin cream (MYCOSTATIN) Apply topically 2 (two) times daily as needed (Redness). 30 g 1  . omeprazole (PRILOSEC) 2 mg/mL SUSP Take 10 mLs (20 mg total) by mouth daily at 12 noon. 150 mL 11  . Oral Hygiene Products (OROPHARYNGEAL SUCTION CATHETER) MISC Use as directed 1 Device in the mouth or throat daily as needed. 3 each 0    Objective: BP 97/62 mmHg  Pulse 99  Temp(Src) 100.5 F (38.1 C) (Rectal)  Resp  13  Wt 88 lb 2.9 oz (40 kg)  SpO2 57% Exam: General: NAD, comfortable in bed HEENT: [place]/AT, Dry mucous membranes Cardiovascular: RRR, no murmurs appreciated Respiratory: Increased WOB, no accessory muscle use, + crackles/rales LLL Abdomen: Soft/NT/ND Extremities: Contractures, no rashes  Skin: No rashes  Labs and Imaging: CBC BMET   Recent Labs Lab 04/22/14 1310  WBC 13.3*  HGB 14.4  HCT 42.8  PLT 164    Recent Labs Lab 04/22/14 1310  NA 131*  K 3.7  CL 99  CO2 24  BUN 8  CREATININE <0.30*  GLUCOSE 90  CALCIUM 8.9     CXR portable one view -  IMPRESSION: Left retrocardiac opacity is noted concerning for atelectasis or pneumonia. Possible mild left pleural effusion is noted as well.  Briscoe DeutscherBryan R Jevaeh Shams, DO 04/22/2014, 3:57 PM PGY-3, [facility] FPTS Intern pager: [phone], text pages welcome

## 2014-04-23 ENCOUNTER — Other Ambulatory Visit: Payer: Self-pay | Admitting: Pediatrics

## 2014-04-23 DIAGNOSIS — R6520 Severe sepsis without septic shock: Secondary | ICD-10-CM

## 2014-04-23 DIAGNOSIS — A419 Sepsis, unspecified organism: Secondary | ICD-10-CM | POA: Insufficient documentation

## 2014-04-23 LAB — COMPREHENSIVE METABOLIC PANEL
ALT: 9 U/L (ref 0–53)
ANION GAP: 6 (ref 5–15)
AST: 13 U/L (ref 0–37)
Albumin: 2.5 g/dL — ABNORMAL LOW (ref 3.5–5.2)
Alkaline Phosphatase: 59 U/L (ref 39–117)
CALCIUM: 8.8 mg/dL (ref 8.4–10.5)
CHLORIDE: 110 mmol/L (ref 96–112)
CO2: 27 mmol/L (ref 19–32)
Creatinine, Ser: <0.3 mg/dL — ABNORMAL LOW (ref 0.50–1.35)
Glucose, Bld: 87 mg/dL (ref 70–99)
Potassium: 3.9 mmol/L (ref 3.5–5.1)
Sodium: 143 mmol/L (ref 135–145)
Total Bilirubin: 0.3 mg/dL (ref 0.3–1.2)
Total Protein: 5.6 g/dL — ABNORMAL LOW (ref 6.0–8.3)

## 2014-04-23 LAB — STREP PNEUMONIAE URINARY ANTIGEN: STREP PNEUMO URINARY ANTIGEN: NEGATIVE

## 2014-04-23 LAB — CBC
HCT: 39.7 % (ref 39.0–52.0)
Hemoglobin: 12.9 g/dL — ABNORMAL LOW (ref 13.0–17.0)
MCH: 30.6 pg (ref 26.0–34.0)
MCHC: 32.5 g/dL (ref 30.0–36.0)
MCV: 94.3 fL (ref 78.0–100.0)
Platelets: 135 10*3/uL — ABNORMAL LOW (ref 150–400)
RBC: 4.21 MIL/uL — AB (ref 4.22–5.81)
RDW: 13.8 % (ref 11.5–15.5)
WBC: 9.3 10*3/uL (ref 4.0–10.5)

## 2014-04-23 MED ORDER — DEXTROSE 5 % IV SOLN
500.0000 mg | INTRAVENOUS | Status: DC
  Administered 2014-04-23: 500 mg via INTRAVENOUS
  Filled 2014-04-23 (×2): qty 500

## 2014-04-23 MED ORDER — WHITE PETROLATUM GEL
Status: AC
  Administered 2014-04-23: 0.2
  Filled 2014-04-23: qty 1

## 2014-04-23 MED ORDER — DIVALPROEX SODIUM 125 MG PO CPSP
250.0000 mg | ORAL_CAPSULE | Freq: Every day | ORAL | Status: DC
  Administered 2014-04-23 – 2014-04-25 (×3): 250 mg via ORAL
  Filled 2014-04-23 (×3): qty 2

## 2014-04-23 NOTE — Progress Notes (Signed)
Family Medicine Teaching [facility] Progress Note Service Pager: [phone]  Patient name: [name] Medical record number: [id number] Date of birth: 06/29/1990 Age: 24 y.o. Gender: male  Primary Care Provider: Jacquiline DoeParker, Caleb, MD Consultants: None Code Status: Full   Chief Complaint: Cough   Assessment and Plan: Todd HibbsJoshua B. Elvin Soearce is a 24 y.o. male presenting with cough found to have LLL PNA . PMH is significant for MR/CP, recurrent PNA.   #Sepsis 2/2 CAP/Aspiration PNA and Lactic Acidosis - Pt with LLL/retrocardiac PNA with possible parapneumonic effusion - Admit to SDU, vitals per unit - F/U on BCx, UCx - Continue Zosyn/Azithro (previously d/c on Clindamycin in 2014 for similar presentation).  No risk factors for HCAP at this time or MRSA but if no response, consider addition of Vancomycin  - Repeat CBC, BMET tomorrow AM - Albuterol, pulmonary toilet PRN, Pulmonary Physiotherapy  - O2 PRN for O2 sat < 92% - F/U gram stain, Sputum Cx - Procalcitonin < 0.1, f/u on following results.  Influenza negative   #Leukocytosis w/ Neutrophilia - Trending down after ABx - repeat CBC in AM   # Hyponatremia - Most likely from hypovolemia hypotonic hyponatremia 2/2 decrease intake - Greatly improved after fluids - F/U BMET tomorrow AM   FEN/GI: NS @ 100 cc/hr  Prophylaxis: SCDs  Disposition: SDU pending further improvement   Subjective:   Pt non communicative and mother not in the room this AM.   Objective: BP 99/59 mmHg  Pulse 80  Temp(Src) 97.3 F (36.3 C) (Oral)  Resp 13  Wt 87 lb 6.4 oz (39.644 kg)  SpO2 99% Exam: General: NAD, comfortable in bed HEENT: [place]/AT, Dry mucous membranes Cardiovascular: RRR, no murmurs appreciated Respiratory: Increased WOB, no accessory muscle use, + crackles/rales LLL Abdomen: Soft/NT/ND, PEG site w/o erythema/induration  Extremities: Contractures, no rashes  Skin: No rashes  Labs and Imaging: CBC BMET   Recent Labs Lab  04/23/14 0332  WBC 9.3  HGB 12.9*  HCT 39.7  PLT 135*    Recent Labs Lab 04/23/14 0332  NA 143  K 3.9  CL 110  CO2 27  BUN <5*  CREATININE <0.30*  GLUCOSE 87  CALCIUM 8.8     CXR portable one view -  IMPRESSION: Left retrocardiac opacity is noted concerning for atelectasis or pneumonia. Possible mild left pleural effusion is noted as well.  Briscoe DeutscherBryan R Olivier Frayre, DO 04/23/2014, 8:10 AM PGY-3, [facility] FPTS Intern pager: [phone], text pages welcome

## 2014-04-23 NOTE — Progress Notes (Signed)
Utilization review completed.  

## 2014-04-24 DIAGNOSIS — F79 Unspecified intellectual disabilities: Secondary | ICD-10-CM

## 2014-04-24 LAB — BASIC METABOLIC PANEL
ANION GAP: 3 — AB (ref 5–15)
BUN: <5 mg/dL — ABNORMAL LOW (ref 6–23)
CHLORIDE: 105 mmol/L (ref 96–112)
CO2: 30 mmol/L (ref 19–32)
Calcium: 8.7 mg/dL (ref 8.4–10.5)
Creatinine, Ser: <0.3 mg/dL — ABNORMAL LOW (ref 0.50–1.35)
Glucose, Bld: 109 mg/dL — ABNORMAL HIGH (ref 70–99)
Potassium: 3.7 mmol/L (ref 3.5–5.1)
SODIUM: 138 mmol/L (ref 135–145)

## 2014-04-24 LAB — CBC
HEMATOCRIT: 38.1 % — AB (ref 39.0–52.0)
Hemoglobin: 12.5 g/dL — ABNORMAL LOW (ref 13.0–17.0)
MCH: 31.2 pg (ref 26.0–34.0)
MCHC: 32.8 g/dL (ref 30.0–36.0)
MCV: 95 fL (ref 78.0–100.0)
Platelets: 141 10*3/uL — ABNORMAL LOW (ref 150–400)
RBC: 4.01 MIL/uL — AB (ref 4.22–5.81)
RDW: 13.5 % (ref 11.5–15.5)
WBC: 5.5 10*3/uL (ref 4.0–10.5)

## 2014-04-24 LAB — LEGIONELLA ANTIGEN, URINE

## 2014-04-24 LAB — URINE CULTURE
Colony Count: NO GROWTH
Culture: NO GROWTH

## 2014-04-24 LAB — PROCALCITONIN: Procalcitonin: 0.13 ng/mL

## 2014-04-24 MED ORDER — ADULT MULTIVITAMIN LIQUID CH
5.0000 mL | Freq: Every day | ORAL | Status: DC
  Administered 2014-04-24 – 2014-04-25 (×2): 5 mL
  Filled 2014-04-24 (×2): qty 5

## 2014-04-24 MED ORDER — CLINDAMYCIN HCL 300 MG PO CAPS
300.0000 mg | ORAL_CAPSULE | Freq: Four times a day (QID) | ORAL | Status: DC
  Administered 2014-04-24 – 2014-04-25 (×5): 300 mg via ORAL
  Filled 2014-04-24 (×5): qty 1

## 2014-04-24 MED ORDER — AZITHROMYCIN 250 MG PO TABS
250.0000 mg | ORAL_TABLET | Freq: Every day | ORAL | Status: DC
  Administered 2014-04-24 – 2014-04-25 (×2): 250 mg via ORAL
  Filled 2014-04-24 (×2): qty 1

## 2014-04-24 MED ORDER — JEVITY 1.2 CAL PO LIQD: 1000.0000 mL | ORAL | Status: DC

## 2014-04-24 NOTE — Progress Notes (Signed)
PT Cancellation Note  Patient Details Name: Todd HibbsJoshua B. Elvin Soearce MRN: [id number] DOB: 10/04/1990   Cancelled Treatment:    Reason Eval/Treat Not Completed: PT screened, no needs identified, will sign off. Spoke with Dr. Wende MottMcKeag regarding patient need for air mattress for home. Instructed MD he doesn't need a PT order and spoke with case management regarding this patient need and they are in the process of getting an air mattress for patient for home. Pt with no acute care needs at this time. Please re-consult if needed in future. Thank you.   Marcene BrawnChadwell, Melah Ebling Marie 04/24/2014, 3:45 PM   Lewis ShockAshly Devann Cribb, PT, DPT Pager #: [phone] Office #: [phone]

## 2014-04-24 NOTE — Progress Notes (Signed)
INITIAL NUTRITION ASSESSMENT  DOCUMENTATION CODES Per approved criteria  -Not Applicable   INTERVENTION: Continue Jevity 1.2 via J-tube at goal rate of 37 ml/hr to provide 1066 kcal, 49 grams of protein, 719 ml of free water.  Provide liquid MVI per tube daily.   Will continue to monitor.  NUTRITION DIAGNOSIS: Inadequate oral intake related to inability to eat as evidenced by NPO status.  Goal: Pt to meet >/= 90% of their estimated nutrition needs   Monitor:  TF tolerance, weight trends, labs, I/O's  Reason for Assessment: New TF  24 y.o. male  Admitting Dx: CAP (community acquired pneumonia)  ASSESSMENT: Pt presenting with cough found to have LLL PNA . PMH is significant for MR/CP, recurrent PNA, J-tube.  Tube feeding has been infusing at goal rate and pt has been tolerating well with no other difficulties. RD ordered the adult tube feeding protocol per MD verbal agreement. No family at bedside. Unable to obtain nutrition information at this time. Nutrition focused physical exam not applicable due to cerebral palsy, quadriplegia, however clavicle/shoulder region WNL.   Labs: Low BUN and creatinine.   Height: Ht Readings from Last 1 Encounters:  05/04/12  (1.575 m)    Weight: Wt Readings from Last 1 Encounters:  04/22/14 87 lb 6.4 oz (39.644 kg)    Ideal Body Weight: N/A  Wt Readings from Last 10 Encounters:  04/22/14 87 lb 6.4 oz (39.644 kg)  01/31/13 95 lb (43.092 kg)  06/16/12 97 lb (43.999 kg)  05/06/12 83 lb 1.8 oz (37.7 kg)  01/05/12 97 lb (44 kg)  11/04/11 95 lb (43.092 kg)  07/08/11 93 lb 11.1 oz (42.5 kg)    Usual Body Weight: Unable to obtain  % Usual Body Weight: ---  BMI:  Body mass index is 15.98 kg/(m^2).  Estimated Nutritional Needs: Kcal: 1050-1250 Protein: 45-55 grams Fluid: >/= 1.2 L/day  Skin: Stage II pressure ulcer on right buttocks and rectum  Diet Order:   NPO  EDUCATION NEEDS: -No education needs identified at this  time   Intake/Output Summary (Last 24 hours) at 04/24/14 1049 Last data filed at 04/24/14 0422  Gross per 24 hour  Intake    272 ml  Output   2900 ml  Net  -2628 ml    Last BM: 2/25  Labs:   Recent Labs Lab 04/22/14 1310 04/23/14 0332 04/24/14 0654  NA 131* 143 138  K 3.7 3.9 3.7  CL 99 110 105  CO2 BUN 8 <5* <5*  CREATININE <0.30* <0.30* <0.30*  CALCIUM 8.9 8.8 8.7  GLUCOSE 90 87 109*    CBG (last 3)  No results for input(s): GLUCAP in the last 72 hours.  Scheduled Meds: . azithromycin  500 mg Intravenous Q24H  . baclofen  20 mg Per Tube 3 times per day  . divalproex  250 mg Oral Daily  . divalproex  375 mg Oral 2 times per day  . gabapentin  300 mg Per Tube 3 times per day  . glycopyrrolate  2 mg Per Tube 2 times per day  . omeprazole  20 mg Oral Q1200  . piperacillin-tazobactam (ZOSYN)  IV  3.375 g Intravenous 3 times per day    Continuous Infusions: . sodium chloride 100 mL/hr at 04/23/14 1006  . feeding supplement (JEVITY 1.2 CAL) 1,000 mL (04/24/14 0313)    Past Medical History  Diagnosis Date  . Allergy   . Asthma   . Cerebral palsy, quadriplegic   .  Congenital CMV infection   . Neuromuscular disorder     cp  . H/O hiatal hernia   . Pneumonia     "he's had it several times; maybe now" (12/17/2011)  . Community acquired pneumonia 06/30/2011    Psuedomonas PNA 06/2011 >tx 21 d of Cipro  CXR 08/15/11 >clearance of PNA    . GERD (gastroesophageal reflux disease)   . Seizures     "has had gran mals before" (12/17/2011)  . Cytomegalovirus   . On home oxygen therapy     "at night" (12/17/2011"  . Jejunostomy tube present   . Acute and chronic respiratory failure 12/24/2011    Past Surgical History  Procedure Laterality Date  . Nissen fundoplication    . Rod  ~ 2003 "or before"    "in his back" (12/17/2011  . Peg tube placement    . Percutaneous gastrotomy tube replacement      Marijean NiemannStephanie La, MS, RD, LDN Pager # [phone] After  hours/ weekend pager # [phone]

## 2014-04-24 NOTE — Progress Notes (Signed)
Family Medicine [facility] Progress Note Service Pager: [phone]  [facility] Medical record number: [id number] Date of birth: 05/04/1990 Age: 24 y.o. Gender: male  Primary Care Provider: Jacquiline DoeParker, Caleb, MD Consultants: None Code Status: Full   Chief Complaint: Cough   Assessment and Plan: [name] is a 24 y.o. male presenting with cough found to have LLL PNA . PMH is significant for MR/CP, recurrent PNA.   #Sepsis 2/2 CAP/Aspiration PNA and Lactic Acidosis - Pt with LLL/retrocardiac PNA with possible parapneumonic effusion - Admit to SDU, vitals per unit - F/U on BCx, UCx - DC Zosyn/Azithro >> PO Clindamycin (for 7 total treatment days) and PO Azithromycin (for 5 total treatment days) - Albuterol, pulmonary toilet PRN, Pulmonary Physiotherapy  - O2 PRN for O2 sat < 92% - F/U gram stain, Sputum Cx - Procalcitonin < 0.1, f/u on following results.  Influenza negative   #Leukocytosis w/ Neutrophilia - Trending down after ABx - WNL 2/29: 5.5  # Hyponatremia - Most likely from hypovolemia hypotonic hyponatremia 2/2 decrease intake - Greatly improved after fluids - Normal 2/29: 138  FEN/GI: NS @ 100 cc/hr  Prophylaxis: SCDs  Disposition: home when medically stable -- likely tomorrow.  Subjective:   Talked to mom for a long period. Was able to answer the majority of her questions. [facility]. I discussed with her the need to have him tolerating PO abx for 24hrs prior for DC. She also asked that I sign a form she had brought in -- for disability parking -- I have, and will be returning this form to her later this afternoon.  Objective: BP 105/80 mmHg  Pulse 90  Temp(Src) 98.2 F (36.8 C) (Oral)  Resp 16  Wt 87 lb 6.4 oz (39.644 kg)  SpO2 96% Exam: General: NAD, comfortable in bed -- Mother reports he is VERY close to BL now HEENT: [place]/AT, Dry mucous membranes Cardiovascular: RRR, no murmurs  appreciated Respiratory: Increased WOB, no accessory muscle use, + crackles/rales LLL Abdomen: Soft/NT/ND, PEG site w/o erythema/induration  Extremities: Contractures, no rashes  Skin: No rashes  Labs and Imaging: CBC BMET   Recent Labs Lab 04/24/14 0654  WBC 5.5  HGB 12.5*  HCT 38.1*  PLT 141*    Recent Labs Lab 04/24/14 0654  NA 138  K 3.7  CL 105  CO2 30  BUN <5*  CREATININE <0.30*  GLUCOSE 109*  CALCIUM 8.7     CXR portable one view -  IMPRESSION: Left retrocardiac opacity is noted concerning for atelectasis or pneumonia. Possible mild left pleural effusion is noted as well.  Kathee DeltonIan D Marites Nath, MD 04/24/2014, 2:06 PM PGY-1, [facility] FPTS Intern pager: [phone], text pages welcome

## 2014-04-24 NOTE — Progress Notes (Signed)
Pt is a bed bound quadriplegic from home with family. Upon skin assessment, pt. noted to have redness and skin breakdown on his buttocks, stage II. Sacral foam dressing applied. Clean, dry and intact. Will continue to monitor.

## 2014-04-25 DIAGNOSIS — G809 Cerebral palsy, unspecified: Secondary | ICD-10-CM

## 2014-04-25 LAB — GLUCOSE, CAPILLARY: GLUCOSE-CAPILLARY: 114 mg/dL — AB (ref 70–99)

## 2014-04-25 MED ORDER — AZITHROMYCIN 250 MG PO TABS: ORAL_TABLET | ORAL | Status: AC

## 2014-04-25 MED ORDER — CLINDAMYCIN HCL 300 MG PO CAPS: 300.0000 mg | ORAL_CAPSULE | Freq: Four times a day (QID) | ORAL | Status: AC

## 2014-04-25 NOTE — Discharge Instructions (Signed)
Antibiotic Medication °Antibiotic medicine helps fight germs. Germs cause infections. This type of medicine will not work for colds, flu, or other viral infections. Tell your doctor if you: °· Are allergic to any medicines. °· Are pregnant or are trying to get pregnant. °· Are taking other medicines. °· Have other medical problems. °HOME CARE °· Take your medicine with a glass of water or food as told by your doctor. °· Take the medicine as told. Finish them even if you start to feel better. °· Do not give your medicine to other people. °· Do not use your medicine in the future for a different infection. °· Ask your doctor about which side effects to watch for. °· Try not to miss any doses. If you miss a dose, take it as soon as possible. If it is almost time for your next dose, and your dosing schedule is: °¨ Two doses a day, take the missed dose and the next dose 5 to 6 hours later. °¨ Three or more doses a day, take the missed dose and the next dose 2 to 4 hours later, or double your next dose. °¨ Then go back to your normal schedule. °GET HELP RIGHT AWAY IF:  °· You get worse or do not get better within a few days. °· The medicine makes you sick. °· You develop a rash or any other side effects. °· You have questions or concerns. °MAKE SURE YOU: °· Understand these instructions. °· Will watch your condition. °· Will get help right away if you are not doing well or get worse. °Document Released: 11/20/2007 Document Revised: 05/05/2011 Document Reviewed: 01/16/2009 °ExitCare® Patient Information ©2015 [employer]. This information is not intended to replace advice given to you by your health care provider. Make sure you discuss any questions you have with your health care provider. ° °

## 2014-04-25 NOTE — Discharge Summary (Signed)
[facility]charge Summary  Patient name: [name] Medical record number: [id number] Date of birth: 03/30/1990 Age: 23 y.o. Gender: male Date of Admission: 04/22/2014  Date of Discharge: 04/25/2014 Admitting Physician: Todd D McDiarmid, MD  Primary Care Provider: Parker, Caleb, MD Consultants: none  Indication for Hospitalization: Nonproductive cough and fever  Discharge Diagnoses/Problem List:  Left lower lobe pneumonia, immunity acquired Lactic acidosis Sepsis secondary to pneumonia Leukocytosis Hyponatremia  Disposition: Home  Discharge Condition: Stable  Discharge Exam:  General: NAD, comfortable in bed -- Mother reports patient is at baseline HEENT: [place]/AT, moist mucous membranes Cardiovascular: RRR, no murmurs appreciated Respiratory: No longer with increased WOB, no accessory muscle use, + mild crackles/rales LLL Abdomen: Soft/NT/ND, PEG site w/o erythema/induration  Extremities: Contractures, no rashes  Skin: No rashes  Brief Hospital Course:  Patient is a 23-year-old male with a past medical history significant for severe mental retardation and cerebral palsy who presented to the ED with a nonproductive cough and fever. Patient was found to have LLL pneumonia. Patient's mother stated that he started having a cough approximately 1 week ago. Cough is nonproductive at the time and he had no other symptoms. Cough gradually worsened over the past week and patient began developing lethargy about 2-3 days prior to admission. On the day of admission patient had a fever of 101F at home, he was not eating, so patient's mother decided to bring him into the ED. He denied any recent vomiting diarrhea rash or diaphoresis.  In the ED patient was started on Rocephin and azithromycin--this was changed to Zosyn and azithromycin on admission. He was provided 1 L normal saline bolus. Labs obtained yielded a lactic acid of 2.4, sodium 131, WBC 13.3.  Influenza PCR was negative. Pro-calcitonin within normal limits. Urine and blood cultures obtained on admission and eventually yielded no growth to date.  Patient was eventually transitioned to by mouth antibiotics of azithromycin and clindamycin on 04/24/14. Patient's WBC gradually declined into normal range. WBC at discharge was 5.5. Patient experienced no fevers throughout his admission. Prior to discharge mother informs medical providers that patient had been at his baseline and that she felt comfortable with his discharge.   Prior to discharge there was some concern about some skin breakdown secondary to patient's quadriplegia from a lifetime of cerebral palsy. After careful consideration there was a order to request an alternating pressure mattress upon discharge for patient to have on an outpatient basis.   Issues for Follow Up:  1. Prescriptions to complete antibiotic courses were written and provided to patient's family. 2. Follow-up appointments set up that patient's PCPs office. 3. Continue to monitor for skin breakdown secondary to patient's quadriplegia.  Significant Procedures: None  Significant Labs and Imaging:   Recent Labs Lab 04/22/14 1310 04/23/14 0332 04/24/14 0654  WBC 13.3* 9.3 5.5  HGB 14.4 12.9* 12.5*  HCT 42.8 39.7 38.1*  PLT 164 135* 141*    Recent Labs Lab 04/22/14 1310 04/23/14 0332 04/24/14 0654  NA 131* 143 138  K 3.7 3.9 3.7  CL 99 110 105  CO2 24 27 30  GLUCOSE 90 87 109*  BUN 8 <5* <5*  CREATININE <0.30* <0.30* <0.30*  CALCIUM 8.9 8.8 8.7  ALKPHOS 72 59  --   AST 24 13  --   ALT 12 9  --   ALBUMIN 3.1* 2.5*  --    pro-calcitonin 0.13 Strep pneumonia urine antigen negative Legionella urine antigen negative Urine culture negative Blood   culture negative  Results/Tests Pending at Time of Discharge: None  Discharge Medications:    Medication List    TAKE these medications        albuterol (2.5 MG/3ML) 0.083% nebulizer solution   Commonly known as:  PROVENTIL  Take 3 mLs (2.5 mg total) by nebulization every 6 (six) hours as needed for wheezing or shortness of breath.     azithromycin 250 MG tablet  Commonly known as:  ZITHROMAX  1 tablet daily.     baclofen 10 MG tablet  Commonly known as:  LIORESAL  CRUSH 2 TABLETS AND TAKE BY GASTROSTOMY TUBE 3 TIMES DAILY     clindamycin 300 MG capsule  Commonly known as:  CLEOCIN  Take 1 capsule (300 mg total) by mouth every 6 (six) hours.     DEPAKOTE SPRINKLES 125 MG capsule  Generic drug:  divalproex  TAKE 3 CAPSULES BY MOUTH EVERY MORNING TAKE 2 CAPSULES MIDDAY AND TAKE 3 CAPSULES AT BEDTIME     DIASTAT ACUDIAL 10 MG Gel  Generic drug:  diazepam  Give 10mg rectally for seizures 2 minutes or longer     feeding supplement (JEVITY 1.2 CAL) Liqd  Place 1,000 mLs into feeding tube continuous.     gabapentin 300 MG capsule  Commonly known as:  NEURONTIN  Take 1 capsule per gastrostomy tube 3 times daily     Gerhardt's butt cream Crea  Apply 1 application topically 2 (two) times daily as needed for irritation. Please provide remained of tube used in hospital     glycopyrrolate 2 MG tablet  Commonly known as:  ROBINUL  1 tablet per gastrostomy tube twice daily     ibuprofen 100 MG/5ML suspension  Commonly known as:  ADVIL,MOTRIN  Place 300 mg into feeding tube every 4 (four) hours as needed for fever.     Incontinence Supplies Misc  May have incontinence supplies as determined by home health agency.     KANGAROO JOEY ENTERAL PUMP Misc  30 Units by Does not apply route daily. Please provide bags for Kangroo Joey enteral feeding device. Should be changed daily.     nystatin cream  Commonly known as:  MYCOSTATIN  Apply topically 2 (two) times daily as needed (Redness).     omeprazole 2 mg/mL Susp  Commonly known as:  PRILOSEC  Take 10 mLs (20 mg total) by mouth daily at 12 noon.     Oropharyngeal Suction Catheter Misc  Use as directed 1 Device in the  mouth or throat daily as needed.        Discharge Instructions: Please refer to Patient Instructions section of EMR for full details.  Patient was counseled important signs and symptoms that should prompt return to medical care, changes in medications, dietary instructions, activity restrictions, and follow up appointments.   Follow-Up Appointments: Follow-up Information    Follow up with Sonnenberg, Eric, MD. Go on 05/01/2014.   Specialty:  Family Medicine   Why:  3:15 Hospital follow up   Contact information:   [address] [place] [address] [phone]       Dequandre Cordova D Ivannah Zody, MD 04/25/2014, 4:48 PM PGY-1, [facility]  

## 2014-04-25 NOTE — Progress Notes (Deleted)
[facility]  Patient name: [facility] Medical record number: [id number] Date of birth: 05/31/1990 Age: 24 y.o. Gender: male Date of Admission: 04/22/2014  Date of Discharge: 04/25/2014 Admitting Physician: Leighton Roachodd D McDiarmid, MD  Primary Care Provider: Jacquiline DoeParker, Caleb, MD Consultants: none  Indication for Hospitalization: Nonproductive cough and fever  Discharge Diagnoses/Problem List:  Left lower lobe pneumonia, immunity acquired Lactic acidosis Sepsis secondary to pneumonia Leukocytosis Hyponatremia  Disposition: Home  Discharge Condition: Stable  Discharge Exam:  General: NAD, comfortable in bed -- Mother reports patient is at baseline HEENT: [place]/AT, moist mucous membranes Cardiovascular: RRR, no murmurs appreciated Respiratory: No longer with increased WOB, no accessory muscle use, + mild crackles/rales LLL Abdomen: Soft/NT/ND, PEG site w/o erythema/induration  Extremities: Contractures, no rashes  Skin: No rashes  Brief Hospital Course:  Patient is a 24 year old male with a past medical history significant for severe mental retardation and cerebral palsy who presented to the ED with a nonproductive cough and fever. Patient was found to have LLL pneumonia. Patient's mother stated that he started having a cough approximately 1 week ago. Cough is nonproductive at the time and he had no other symptoms. Cough gradually worsened over the past week and patient began developing lethargy about 2-3 days prior to admission. On the day of admission patient had a fever of 101F at home, he was not eating, so patient's mother decided to bring him into the ED. He denied any recent vomiting diarrhea rash or diaphoresis.  In the ED patient was started on Rocephin and azithromycin--this was changed to Zosyn and azithromycin on admission. He was provided 1 L normal saline bolus. Labs obtained yielded a lactic acid of 2.4, sodium 131, WBC 13.3.  Influenza PCR was negative. Pro-calcitonin within normal limits. Urine and blood cultures obtained on admission and eventually yielded no growth to date.  Patient was eventually transitioned to by mouth antibiotics of azithromycin and clindamycin on 04/24/14. Patient's WBC gradually declined into normal range. WBC at discharge was 5.5. Patient experienced no fevers throughout his admission. Prior to discharge mother informs medical providers that patient had been at his baseline and that she felt comfortable with his discharge.   Prior to discharge there was some concern about some skin breakdown secondary to patient's quadriplegia from a lifetime of cerebral palsy. After careful consideration there was a order to request an alternating pressure mattress upon discharge for patient to have on an outpatient basis.   Issues for Follow Up:  1. Prescriptions to complete antibiotic courses were written and provided to patient's family. 2. Follow-up appointments set up that patient's PCPs office. 3. Continue to monitor for skin breakdown secondary to patient's quadriplegia.  Significant Procedures: None  Significant Labs and Imaging:   Recent Labs Lab 04/22/14 1310 04/23/14 0332 04/24/14 0654  WBC 13.3* 9.3 5.5  HGB 14.4 12.9* 12.5*  HCT 42.8 39.7 38.1*  PLT 164 135* 141*    Recent Labs Lab 04/22/14 1310 04/23/14 0332 04/24/14 0654  NA 131* 143 138  K 3.7 3.9 3.7  CL 99 110 105  CO2 24 27 30   GLUCOSE 90 87 109*  BUN 8 <5* <5*  CREATININE <0.30* <0.30* <0.30*  CALCIUM 8.9 8.8 8.7  ALKPHOS 72 59  --   AST 24 13  --   ALT 12 9  --   ALBUMIN 3.1* 2.5*  --    pro-calcitonin 0.13 Strep pneumonia urine antigen negative Legionella urine antigen negative Urine culture negative Blood  culture negative  Results/Tests Pending at Time of Discharge: None  Discharge Medications:    Medication List    TAKE these medications        albuterol (2.5 MG/3ML) 0.083% nebulizer solution   Commonly known as:  PROVENTIL  Take 3 mLs (2.5 mg total) by nebulization every 6 (six) hours as needed for wheezing or shortness of breath.     azithromycin 250 MG tablet  Commonly known as:  ZITHROMAX  1 tablet daily.     baclofen 10 MG tablet  Commonly known as:  LIORESAL  CRUSH 2 TABLETS AND TAKE BY GASTROSTOMY TUBE 3 TIMES DAILY     clindamycin 300 MG capsule  Commonly known as:  CLEOCIN  Take 1 capsule (300 mg total) by mouth every 6 (six) hours.     DEPAKOTE SPRINKLES 125 MG capsule  Generic drug:  divalproex  TAKE 3 CAPSULES BY MOUTH EVERY MORNING TAKE 2 CAPSULES MIDDAY AND TAKE 3 CAPSULES AT BEDTIME     DIASTAT ACUDIAL 10 MG Gel  Generic drug:  diazepam  Give  rectally for seizures 2 minutes or longer     feeding supplement (JEVITY 1.2 CAL) Liqd  Place 1,000 mLs into feeding tube continuous.     gabapentin 300 MG capsule  Commonly known as:  NEURONTIN  Take 1 capsule per gastrostomy tube 3 times daily     Gerhardt's butt cream Crea  Apply 1 application topically 2 (two) times daily as needed for irritation. Please provide remained of tube used in hospital     glycopyrrolate 2 MG tablet  Commonly known as:  ROBINUL  1 tablet per gastrostomy tube twice daily     ibuprofen 100 MG/5ML suspension  Commonly known as:  ADVIL,MOTRIN  Place 300 mg into feeding tube every 4 (four) hours as needed for fever.     Incontinence Supplies Misc  May have incontinence supplies as determined by home health agency.     KANGAROO JOEY ENTERAL PUMP Misc  30 Units by Does not apply route daily. Please provide bags for Kangroo Joey enteral feeding device. Should be changed daily.     nystatin cream  Commonly known as:  MYCOSTATIN  Apply topically 2 (two) times daily as needed (Redness).     omeprazole 2 mg/mL Susp  Commonly known as:  PRILOSEC  Take 10 mLs (20 mg total) by mouth daily at 12 noon.     Oropharyngeal Suction Catheter Misc  Use as directed 1 Device in the  mouth or throat daily as needed.        Discharge Instructions: Please refer to Patient Instructions section of EMR for full details.  Patient was counseled important signs and symptoms that should prompt return to medical care, changes in medications, dietary instructions, activity restrictions, and follow up appointments.   Follow-Up Appointments: Follow-up Information    Follow up with Marikay Alar, MD. Go on 05/01/2014.   Specialty:  Family Medicine   Why:  [facility] follow up   Contact information:   [address] [place] [address] [phone]       Kathee Delton, MD 04/25/2014, 4:48 PM PGY-1, [facility]

## 2014-04-25 NOTE — Evaluation (Signed)
Physical Therapy Evaluation and discharge Patient Details Name: [name]. Hoose MRN: [id number] DOB: [birth year] Today's Date: 04/25/2014   History of Present Illness  Pt is a 24 yo male with h/o CP/quadraplegia admitted for pneumonia.   Clinical Impression  Pt found to have skin breakdown of stage II on R buttocks and scrotum. Mother reports they've been dealing with them for about a year. Pt to strongly benefit from alternating pressure air mattress to prevent further skin breakdown as pt is prone to this from being a quadreplegia and bed bound. Did recommend for mother to have patients w/c re-fitted for optimal support so she can get him OOB. Instructed mother to change tilt of w/c every 15 min for pressure relief. Pt with no further acute care needs at this time. PT signing off. Please re-consult if needed in future.    Follow Up Recommendations No PT follow up;Supervision/Assistance - 24 hour; outpt w/c fitting appt    Equipment Recommendations  Alternating air mattress overlay for hospital bed at home   Recommendations for Other Services  (out patient w/c fitting appointment)     Precautions / Restrictions Precautions Precautions: Sternal      Mobility  Bed Mobility Overal bed mobility: +2 for physical assistance;Needs Assistance Bed Mobility: Rolling Rolling: Total assist         General bed mobility comments: pt with no ability to initiate task  Transfers                    Ambulation/Gait                Stairs            Wheelchair Mobility    Modified Rankin (Stroke Patients Only)       Balance                                             Pertinent Vitals/Pain      Home Living Family/patient expects to be discharged to:: Private residence Living Arrangements: Parent Available Help at Discharge: Family;Available 24 hours/day Type of Home: House Home Access: Ramped entrance     Home Layout: One level Home  Equipment: Wheelchair - manual;Wheelchair - power;Hospital bed (hoyer lift)      Prior Function Level of Independence: Needs assistance   Gait / Transfers Assistance Needed: pt non-ambulatory and bed bound  ADL's / Homemaking Assistance Needed: pt dependent        Hand Dominance        Extremity/Trunk Assessment   Upper Extremity Assessment:  (pt with bilat UE flexion contractors)           Lower Extremity Assessment:  (bilat flexion contractors)      Cervical / Trunk Assessment:  (scoliosis)  Communication   Communication:  (non-verbal)  Cognition Arousal/Alertness: Awake/alert Behavior During Therapy: WFL for tasks assessed/performed Overall Cognitive Status: History of cognitive impairments - at baseline                      General Comments General comments (skin integrity, edema, etc.): skin breakdown: R buttocks an area of 1.25 inches of redness. Scrotum: 1 inch of redness. Mother reports they just started closing again b/c he hasn't had diarrhea but she's been battling them for about a 1 year.    Exercises  Assessment/Plan    PT Assessment Patent does not need any further PT services  PT Diagnosis Quadraplegia   PT Problem List    PT Treatment Interventions     PT Goals (Current goals can be found in the Care Plan section) Acute Rehab PT Goals PT Goal Formulation: All assessment and education complete, DC therapy    Frequency     Barriers to discharge        Co-evaluation               End of Session   Activity Tolerance: Patient tolerated treatment well Patient left: in bed;with call bell/phone within reach;with family/visitor present Nurse Communication: Mobility status         Time: [id number] PT Time Calculation (min) (ACUTE ONLY): 28 min   Charges:   PT Evaluation $Initial PT Evaluation Tier I: 1 Procedure PT Treatments $Therapeutic Activity: 8-22 mins   PT G CodesMarcene Brawn:        Haelyn Forgey  Marie 04/25/2014, 11:58 AM   Lewis ShockAshly Roshana Shuffield, PT, DPT Pager #: [phone] Office #: [phone]

## 2014-04-25 NOTE — Care Management Note (Signed)
CARE MANAGEMENT NOTE 04/25/2014  Patient:  Wildeman,Bertel B.   Account Number:  [id number]  Date Initiated:  04/25/2014  Documentation initiated by:  Vance PeperBRADY,Tyann Niehaus  Subjective/Objective Assessment:   24 yr old male admitted with Community Acquired pneumonia. Hx of CP, is a quadraplegic.     Action/Plan:   CM spoke with patient's mom- [name], concerning need for air mattress. CM spoke with MD concerning necessary documentation. Contacted [facility] DME liaison and requested mattress for home.   Anticipated DC Date:  04/25/2014   Anticipated DC Plan:  HOME/SELF CARE      DC Planning Services  CM consult      PAC Choice  DURABLE MEDICAL EQUIPMENT   Choice offered to / List presented to:     DME arranged  AIR OVERLAY MATTRESS      [facility]ME agency  [facility]     [facility]  NA      Status of service:  Completed, signed off Medicare Important Message given?   (If response is "NO", the following Medicare IM given date fields will be blank) Date Medicare IM given:   Medicare IM given by:   Date Additional Medicare IM given:   Additional Medicare IM given by:    Discharge Disposition:  HOME/SELF CARE  Per UR Regulation:  Reviewed for med. necessity/level of care/duration of stay  If discussed at Long Length of Stay Meetings, dates discussed:    Comments:

## 2014-04-28 ENCOUNTER — Ambulatory Visit: Payer: BC Managed Care – PPO | Admitting: Physical Therapy

## 2014-04-28 LAB — CULTURE, BLOOD (ROUTINE X 2): Culture: NO GROWTH

## 2014-04-29 LAB — CULTURE, BLOOD (ROUTINE X 2): CULTURE: NO GROWTH

## 2014-05-01 ENCOUNTER — Ambulatory Visit: Payer: BC Managed Care – PPO | Admitting: Family Medicine

## 2014-05-02 ENCOUNTER — Ambulatory Visit (INDEPENDENT_AMBULATORY_CARE_PROVIDER_SITE_OTHER): Payer: BC Managed Care – PPO | Admitting: Family Medicine

## 2014-05-02 ENCOUNTER — Encounter: Payer: Self-pay | Admitting: Family Medicine

## 2014-05-02 VITALS — BP 99/68 | HR 96 | Temp 96.9°F

## 2014-05-02 DIAGNOSIS — J690 Pneumonitis due to inhalation of food and vomit: Secondary | ICD-10-CM | POA: Diagnosis not present

## 2014-05-02 DIAGNOSIS — L8992 Pressure ulcer of unspecified site, stage 2: Secondary | ICD-10-CM

## 2014-05-02 NOTE — Progress Notes (Signed)
Patient ID: [name], male   DOB: 06/30/1990, 24 y.o.   MRN: [id number] Subjective:   CC: Hospital follow-up  HPI:   Pneumonia Admitted 2/27-3/1 for nonproductive cough and fever, treated for LLL pna. Treated with azithromycin and rocephin>>zosyn and azithromycin>>azithro and clinda. Urine and blood cultures no growth final. Mom reports that he has been generally doing well, with a few episodes of shortness of breath during which she had to use bulb suctioning to pull out a mucous plug. At this time, he had "a few moments" of decreasing oxygen saturation into the low 80s, which resolved once suctioning was complete. She denies fevers. Overall he has been improving but energy level is not back to normal. He has been tolerating normal tube feeds, urinating normally, not having any diarrhea. He finished azithromycin and clindamycin yesterday.  Sacral ulcer He also had a worsening sacral ulcer during hospitalization and DME for a home air mattress was ordered. This has been received and is greatly improving ulceration per mom. Sacral ulcer erythema is improving and skin has not broken. Scrotal ulcer is also much better, with scarring from where it had been an open ulcer when last seen in clinic.   Review of Systems - Per HPI.   PMH: CP/MR, recent hospitalization for pneumonia     Objective:  Physical Exam BP 99/68 mmHg  Pulse 96  Temp(Src) 96.9 F (36.1 C) (Axillary)  SpO2 94% GEN: NAD CV: RRR, no m/r/g PULM: CTAB anteriorly, no wheezes or crackles; posterior difficult due to not taking deep breathes, but normal effort and no obvious crackles ABD: S/NT/ND SKIN: Declined ulcer eval - she states she checks it daily and it is improving in both scrotum and sacrum    Assessment:     [name] is a 24 y.o. male here for hospital follow up.    Plan:     # See problem list and after visit summary for problem-specific plans.   # Health Maintenance: Not  discussed  Follow-up: Follow up PRN for any worsening symptoms (fever, increased sputum production, dyspnea).   Todd SingletonMaria T Jacquita Mulhearn, MD [facility]

## 2014-05-02 NOTE — Assessment & Plan Note (Signed)
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 Improving with home alternating pressure air mattress -Mom is great with home care.  -Follow-up when necessary

## 2014-05-02 NOTE — Assessment & Plan Note (Signed)
Pneumonia, likely aspirational. Improving with finishing full course of antibiotics. Still having occasional coughing spells likely due to cerebral palsy and chronic issues with mucus plugging. -Continue regular pulmonary PT and suctioning. -Per mom's request, agreed that follow-up with pulmonology Dr. Delton CoombesByrum would be a good idea given recent hospitalization. She will try and make an appointment, but will call if referral needed. -Follow-up when necessary if any worsened cough or trouble breathing.  -Rx for suction supplies written and given to mom.

## 2014-05-02 NOTE — Patient Instructions (Signed)
I am glad Ivin BootyJoshua is doing well.  Continue sputum management. I have given you prescription for suction supplies. [facility] Call to set up a follow up appointment with Dr Delton CoombesByrum to check in.  Follow up here as needed at the first sign of worsening (fever, increased sputum production, trouble breathing).  Continue ulcer management.  Best,  Leona SingletonMaria T Monzerat Handler, MD

## 2014-05-03 ENCOUNTER — Other Ambulatory Visit: Payer: Self-pay | Admitting: Pediatrics

## 2014-05-03 ENCOUNTER — Other Ambulatory Visit: Payer: Self-pay | Admitting: Family

## 2014-05-05 ENCOUNTER — Other Ambulatory Visit (HOSPITAL_COMMUNITY): Payer: Self-pay | Admitting: Interventional Radiology

## 2014-05-05 ENCOUNTER — Ambulatory Visit (HOSPITAL_COMMUNITY)
Admission: RE | Admit: 2014-05-05 | Discharge: 2014-05-05 | Disposition: A | Discharge status: Home / Self Care | Payer: BC Managed Care – PPO | Source: Ambulatory Visit | Attending: Interventional Radiology | Admitting: Interventional Radiology

## 2014-05-05 DIAGNOSIS — Z431 Encounter for attention to gastrostomy: Secondary | ICD-10-CM | POA: Insufficient documentation

## 2014-05-05 DIAGNOSIS — G809 Cerebral palsy, unspecified: Secondary | ICD-10-CM | POA: Diagnosis not present

## 2014-05-05 MED ORDER — SILVER NITRATE-POT NITRATE 75-25 % EX MISC
1.0000 | Freq: Once | CUTANEOUS | Status: DC
  Filled 2014-05-05: qty 1

## 2014-05-05 MED ORDER — LIDOCAINE VISCOUS 2 % MT SOLN
OROMUCOSAL | Status: AC
  Filled 2014-05-05: qty 15

## 2014-05-05 MED ORDER — IOHEXOL 300 MG/ML  SOLN
50.0000 mL | Freq: Once | INTRAMUSCULAR | Status: AC | PRN
  Administered 2014-05-05: 15 mL

## 2014-05-23 ENCOUNTER — Other Ambulatory Visit: Payer: Self-pay | Admitting: Family

## 2014-05-31 ENCOUNTER — Other Ambulatory Visit: Payer: Self-pay | Admitting: Family

## 2014-06-06 ENCOUNTER — Ambulatory Visit (INDEPENDENT_AMBULATORY_CARE_PROVIDER_SITE_OTHER): Payer: BC Managed Care – PPO | Admitting: Pediatrics

## 2014-06-06 ENCOUNTER — Encounter: Payer: Self-pay | Admitting: Pediatrics

## 2014-06-06 VITALS — BP 80/50 | HR 84

## 2014-06-06 DIAGNOSIS — K117 Disturbances of salivary secretion: Secondary | ICD-10-CM | POA: Diagnosis not present

## 2014-06-06 DIAGNOSIS — F73 Profound intellectual disabilities: Secondary | ICD-10-CM | POA: Diagnosis not present

## 2014-06-06 DIAGNOSIS — G808 Other cerebral palsy: Secondary | ICD-10-CM | POA: Diagnosis not present

## 2014-06-06 DIAGNOSIS — G40219 Localization-related (focal) (partial) symptomatic epilepsy and epileptic syndromes with complex partial seizures, intractable, without status epilepticus: Secondary | ICD-10-CM

## 2014-06-06 DIAGNOSIS — H47619 Cortical blindness, unspecified side of brain: Secondary | ICD-10-CM | POA: Diagnosis not present

## 2014-06-06 DIAGNOSIS — G40319 Generalized idiopathic epilepsy and epileptic syndromes, intractable, without status epilepticus: Secondary | ICD-10-CM

## 2014-06-06 DIAGNOSIS — G40311 Generalized idiopathic epilepsy and epileptic syndromes, intractable, with status epilepticus: Secondary | ICD-10-CM

## 2014-06-06 MED ORDER — GABAPENTIN 300 MG PO CAPS: ORAL_CAPSULE | ORAL | Status: DC

## 2014-06-06 MED ORDER — GLYCOPYRROLATE 2 MG PO TABS: ORAL_TABLET | ORAL | Status: AC

## 2014-06-06 MED ORDER — DEPAKOTE SPRINKLES 125 MG PO CPSP: ORAL_CAPSULE | ORAL | Status: AC

## 2014-06-06 MED ORDER — BACLOFEN 10 MG PO TABS: ORAL_TABLET | ORAL | Status: AC

## 2014-06-06 NOTE — Progress Notes (Signed)
Patient: [name]. Fleer MRN: [id number] Sex: male DOB: Jul 03, 1990  Provider: Deetta Perla, MD Location of Care: [facility]  Note type: Routine return visit  History of Present Illness: Referral Source: Dr. Doralee Albino History from: father and [facility] chart Chief Complaint: Epilepsy/Congenital Quadriplegia  [name] is a 24 y.o. male who returns June 06, 2014, for the first time since October 21, 2013.  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  He was here today with his father who said that he was hospitalized within the past six months for two to three days with pneumonia.  He has generalized tonic-clonic seizures one to two times per day and likely has myoclonic seizures and complex partial seizures more frequently.  He is awake on and off for six to eight hours per day, but sleeps much of the rest of the time.  He is tube fed.  He is largely unaware of his external surroundings.    His seizures are treated with Depakote and Neurontin.  Neurontin also helps for chronic pain.  He is on baclofen for spasticity and glycopyrrolate for problems with sialorrhea.  Overall, his health has been stable.    The seizures are bothersome.  There are probably other medications that potentially could improve his seizure control.  For the time being, I think that his family is content with his current treatment.  If seizures worsen, I would be happy to repeat his EEG and to make decisions about addition of other antiepileptic medications that might be more effective.  Review of Systems: 12 system review was unremarkable except as noted above in the history of present illness  Past Medical History Diagnosis Date  . Allergy   . Asthma   . Cerebral palsy, quadriplegic   .  Congenital CMV infection   . Neuromuscular disorder     cp  . H/O hiatal hernia   . Pneumonia     "he's had it several times; maybe now" (12/17/2011)  . Community acquired pneumonia 06/30/2011    Psuedomonas PNA 06/2011 >tx 21 d of Cipro  CXR 08/15/11 >clearance of PNA    . GERD (gastroesophageal reflux disease)   . Seizures     "has had gran mals before" (12/17/2011)  . Cytomegalovirus   . On home oxygen therapy     "at night" (12/17/2011"  . Jejunostomy tube present   . Acute and chronic respiratory failure 12/24/2011   Hospitalizations: Yes.  , Head Injury: No., Nervous System Infections: No., Immunizations up to date: Yes.     He was hospitalized Jun 30, 2011, through Jul 08, 2011, for community acquired pneumonia with Pseudomonas. He was treated for 21 days with ciprofloxacin. He has been followed by pulmonary.   Ashe was hospitalized for pneumonia with in the last 6 months.  His family has insisted that his condition be managed as best it can be without intubation or tracheostomy.  Behavior History none  Surgical History Procedure Laterality Date  . Nissen fundoplication    . Rod  ~ 2003 "or before"    "in his back" (12/17/2011  . Peg tube placement    . Percutaneous gastrotomy tube replacement    . Circumcision      at birth   Family History family history includes Allergies in his mother; Colon cancer in his paternal grandmother; Diabetes in his father;  Heart disease in his maternal uncle. Family history is negative for migraines, seizures, intellectual disabilities, blindness, deafness, birth defects, chromosomal disorder, or autism.  Social History . Marital Status: Single    Spouse Name: N/A  . Number of Children: N/A  . Years of Education: N/A   Social History Main Topics  . Smoking status: Never Smoker   . Smokeless tobacco: Never Used  . Alcohol Use: No  . Drug Use: No  . Sexual Activity: No   Social History Narrative   Living with mother, father  and sibling   Allergies Allergen Reactions  . Sulfamethoxazole-Trimethoprim Rash  . Topamax [Topiramate]    Physical Exam BP 80/50 mmHg  Pulse 84  General: Asleep, thin, spastic deformities of his limbs, in no acute distress, non-handed, brown curly hair, blue eyes  Head: microcephalic, no dysmorphic features  Ears, Nose and Throat: Otoscopic: tympanic membranes can be seen on the left, wax occludes the right. Pharynx: oropharynx is pink without exudates or tonsillar hypertrophy.  Neck: supple, full range of motion, no cranial or cervical bruits  Respiratory: auscultation clear  Cardiovascular: no murmurs, pulses are normal  Musculoskeletal: he has severe contractures of his arms with clawhand deformities, flexion contractures of the elbows, shoulders, wrists, hips, knees, externally rotated feet with tight heel cords, repaired scoliosis. I can't extend the right leg completely. There are flexion contractions in the left knee and both elbows right greater than left.  Skin: no rashes or neurocutaneous lesions  Neurologic Exam   Mental Status: asleep throughout; he follows no commands. He is mute  Cranial Nerves: Cortical blindness with significant optic disc pallor, and minimally reactive pupils, slowly roving eye movements, impassive face with lower facial weakness, inability to protrude his tongue, does not localize sound in either visual field, I did not test corneals or gag response.  Motor: spastic quadriparesis with tone that appears to be greater on the left side than the right, decreased muscle mass on the left arm, arms and legs are flexed. left arm can be nearly fully extended, the right has a flexion contracture; claw-hand deformities in his hands bilaterally; legs can not be extended fully right better than left; feet are externally rotated, left greater than right; tight heel cords. He has very limited spontaneous movements.  Sensory: withdrawal x4 to the limits of  his strength  Coordination: no tremor, unable to test  Gait and Station: wheelchair-bound with difficulty with head control ( head flops forward) He has a device to protect his neck. Reflexes: diminished bilaterally due to co-contraction; sustained ankle clonus on the left, 2-3 beats of ankle clonus on the right; bilateral extensor plantar responses  Assessment 1. 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 2. Congenital quadriplegia, G80.8. 3. 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 4. Partial epilepsy with impairment of consciousness, intractable, G40.219. 5. 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 6. Profound intellectual disabilities, F73. 7. Drooling, K11.7.  Discussion The patient is medically and neurologically stable.  I am not going to make changes in medications.  I refilled gabapentin, Depakote, baclofen, and glycopyrrolate.  Depakote is brand name medically necessary.  Plan He will return to see me in six months' time.  I spent 30 minutes of face-to-face time with the patient and his father, more than half of it in consultation.   Medication List   This list is accurate as of: 06/06/14  4:15 PM.       albuterol (2.5 MG/3ML) 0.083% nebulizer solution  Commonly known as:  PROVENTIL  Take 3 mLs (2.5 mg total) by nebulization every 6 (six) hours as needed for wheezing or shortness of breath.     azithromycin 250 MG tablet  Commonly known as:  ZITHROMAX  1 tablet daily.     baclofen 10 MG tablet  Commonly known as:  LIORESAL  CRUSH 2 TABLETS AND TAKE BY GASTROSTOMY TUBE 3 TIMES DAILY     clindamycin 300 MG capsule  Commonly known as:  CLEOCIN  Take 1 capsule (300 mg total) by mouth every 6 (six) hours.     DEPAKOTE SPRINKLES 125 MG capsule  Generic drug:  divalproex  TAKE 3 CAPSULES EVERY MORNING, TAKE 2 CAPSULES MIDDAY, AND TAKE 3 CAPSULES AT BEDTIME     DIASTAT ACUDIAL 10 MG Gel  Generic drug:   diazepam  Give  rectally for seizures 2 minutes or longer     feeding supplement (JEVITY 1.2 CAL) Liqd  Place 1,000 mLs into feeding tube continuous.     gabapentin 300 MG capsule  Commonly known as:  NEURONTIN  Take 1 capsule per gastrostomy tube 3 times daily     Gerhardt's butt cream Crea  Apply 1 application topically 2 (two) times daily as needed for irritation. Please provide remained of tube used in hospital     glycopyrrolate 2 MG tablet  Commonly known as:  ROBINUL  TAKE 1 TABLET BY MOUTH PER G-TUBE TWICE A DAY     ibuprofen 100 MG/5ML suspension  Commonly known as:  ADVIL,MOTRIN  Place 300 mg into feeding tube every 4 (four) hours as needed for fever.     Incontinence Supplies Misc  May have incontinence supplies as determined by home health agency.     KANGAROO JOEY ENTERAL PUMP Misc  30 Units by Does not apply route daily. Please provide bags for Kangroo Joey enteral feeding device. Should be changed daily.     nystatin cream  Commonly known as:  MYCOSTATIN  Apply topically 2 (two) times daily as needed (Redness).     omeprazole 2 mg/mL Susp  Commonly known as:  PRILOSEC  Take 10 mLs (20 mg total) by mouth daily at 12 noon.     Oropharyngeal Suction Catheter Misc  Use as directed 1 Device in the mouth or throat daily as needed.      The medication list was reviewed and reconciled. All changes or newly prescribed medications were explained.  A complete medication list was provided to the patient/caregiver.  Deetta Perla MD

## 2014-07-11 ENCOUNTER — Other Ambulatory Visit: Payer: Self-pay | Admitting: Pediatrics

## 2014-07-12 ENCOUNTER — Other Ambulatory Visit: Payer: Self-pay | Admitting: Pediatrics

## 2014-07-20 ENCOUNTER — Telehealth: Payer: Self-pay | Admitting: *Deleted

## 2014-07-20 NOTE — Telephone Encounter (Signed)
Raynelle FanningJulie from [facility] called and left a voicemail regarding patient's prescription for Diazepam 20 mg gel. She states that they are being [facility] [employer] [facility] [employer] [employer] [facility] this prescription and they are needed to fulfill insurance requirements. She states that they are needing documentation on hard copy of either day supply of how long it should last or maximum dosage, she also states that she needs to speak to someone and have documentation of who she spoke with as the insurance company is asking for this as well.   Call back number: [phone] Fax Number: [phone]

## 2014-07-20 NOTE — Telephone Encounter (Signed)
I called [name] at [facility] and explained that the Diastat Rx was a 30 day supply, and that the patient has daily seizures. She had no other questions. TG

## 2014-07-28 ENCOUNTER — Other Ambulatory Visit: Payer: Self-pay | Admitting: Family

## 2014-09-22 IMAGING — XA IR REPLC DUODEN/JEJUNO TUBE PERCUT W/FLUORO
1 series · 7 of 7 positions shown · non-contrast
Comparison: Fluoroscopic-guided gastrojejunostomy tube exchange -
05/06/2012;

INDICATION: Gastrojejunostomy tube fallen out

FLUROSCOPIC GUIDED REPLACEMENT OF GASTROJEJUNOSTOMY TUBE

[Series 1: run · 7 of 7 slices shown]
[im 1/7]
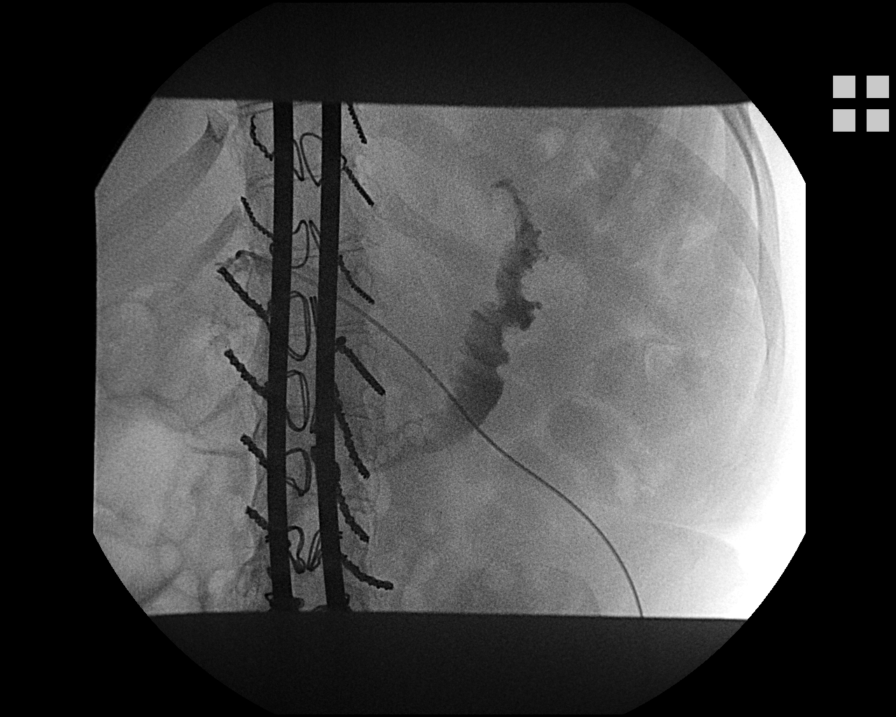
[im 2/7]
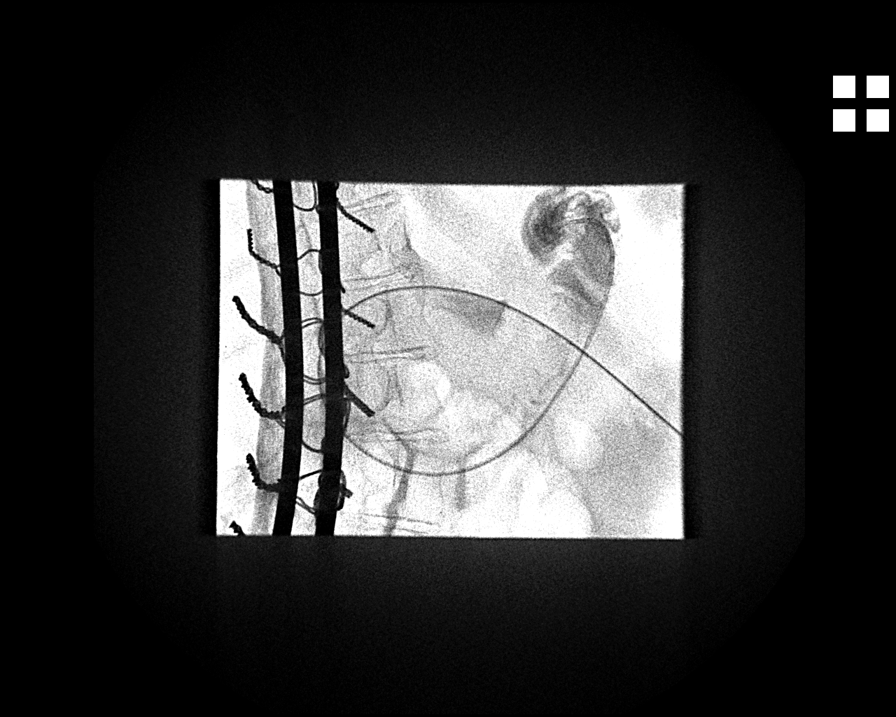
[im 3/7]
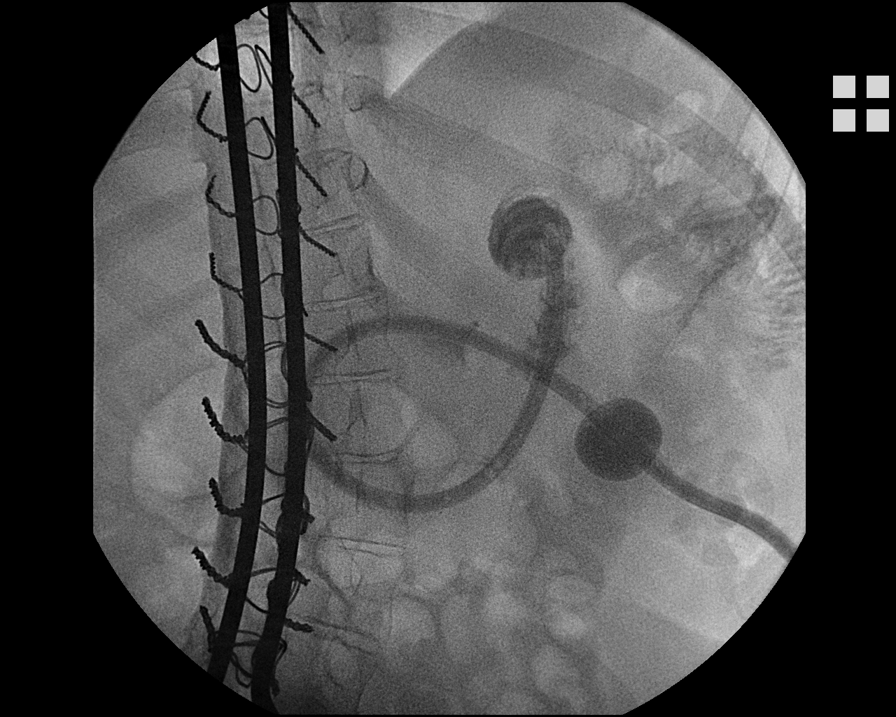
[im 4/7]
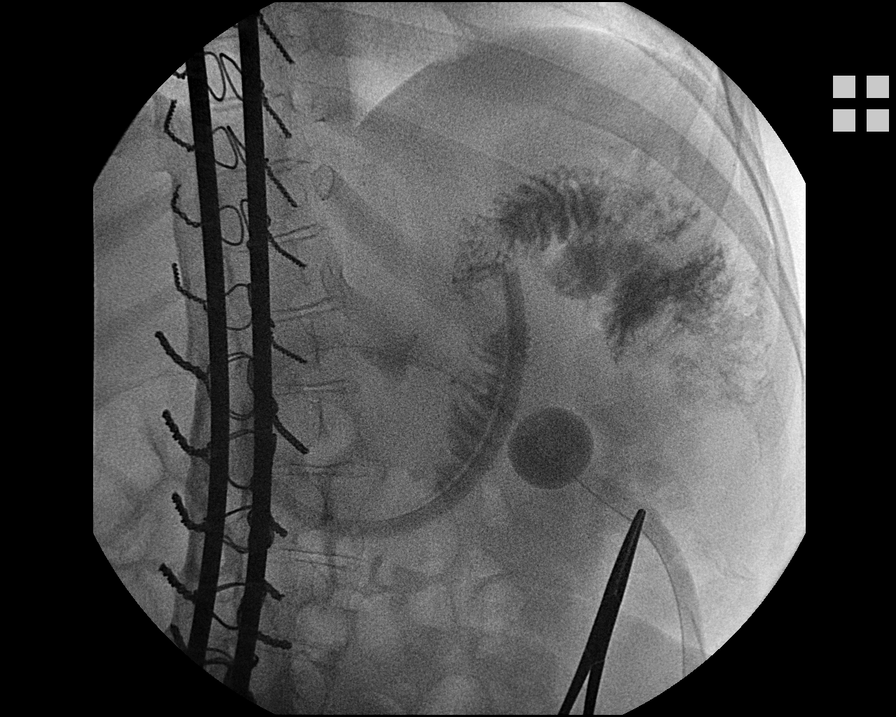
[im 5/7]
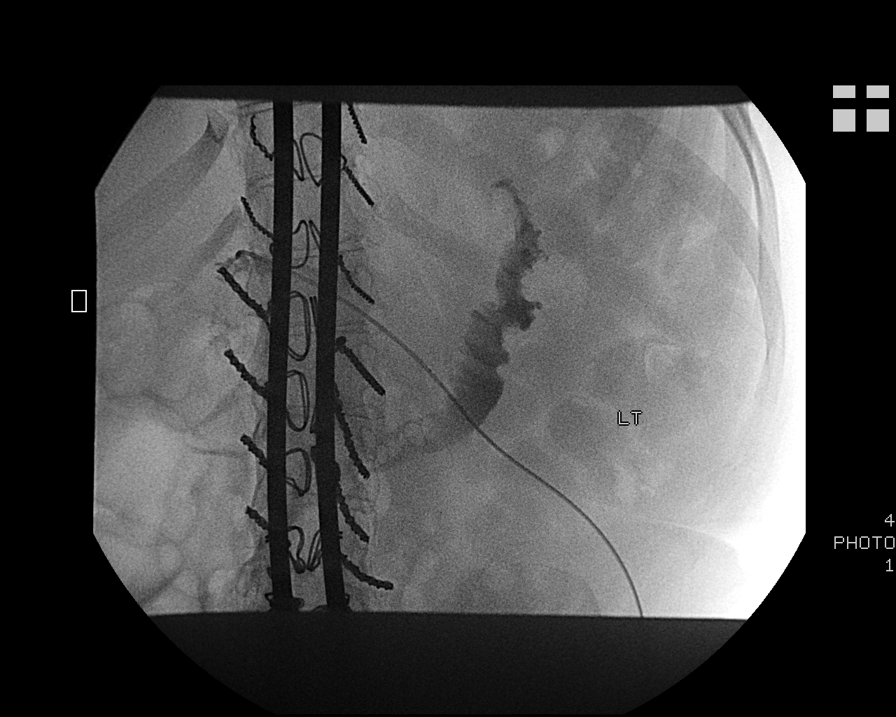
[im 6/7]
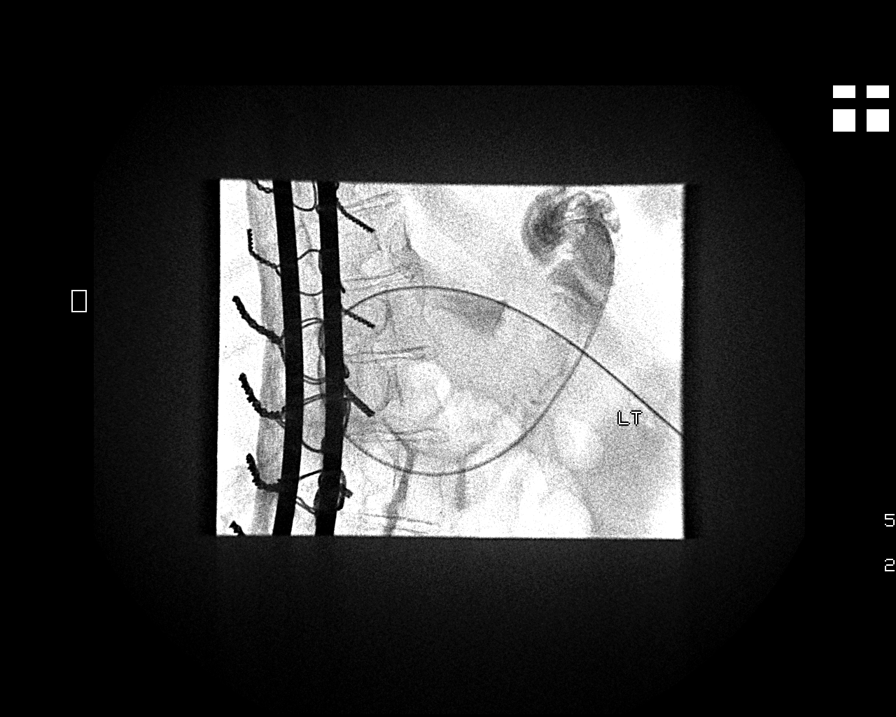
[im 7/7]
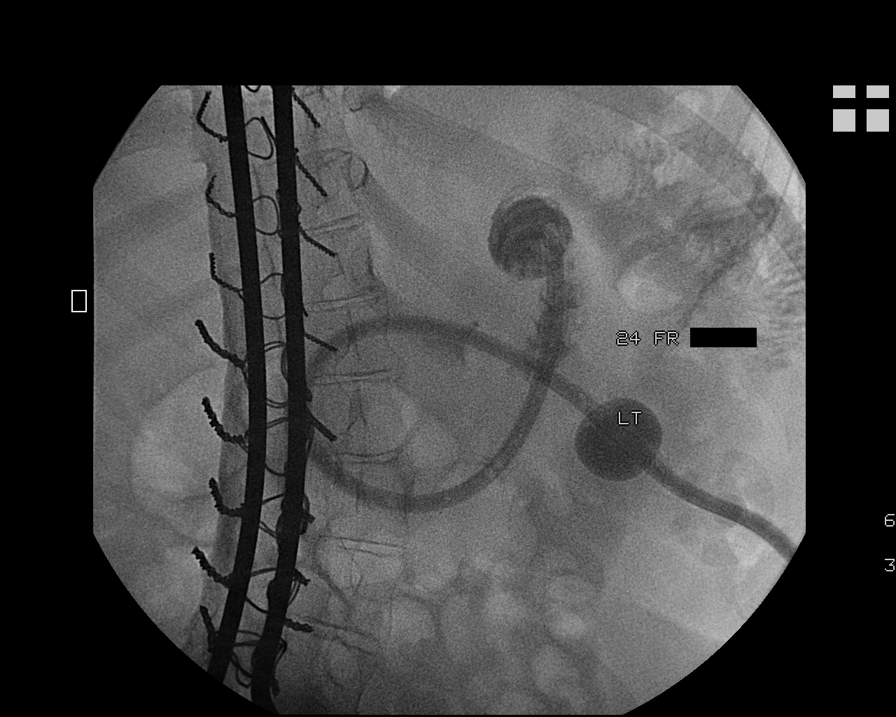

[7 of 7 positions shown; findings below may reference images not displayed]

03/05/2012; 12/31/2011; CT abdomen and pelvis -
05/04/2012

Intravenous Medications: None

Contrast: 25 ml [id number], administered enterically

Fluoroscopy Time: 1.3 minutes.

Complications: None

Technique / Findings:

Informed written consent was obtained from the patient's mother
after a discussion of the risks and benefits.  The upper abdomen
and the ostomy of the prior gastrojejunostomy tube was prepped and
draped in the usual sterile fashion, and a sterile drape was
applied covering the operative field.  Maximum barrier sterile
technique with sterile gowns and gloves were used for the
procedure.  A timeout was performed prior to the initiation of the
procedure.

With the use of a stiff glide wire, a Kumpe catheter was
manipulated through the chronic gastrostomy tube tract.  Contrast
injection confirmed appropriate intraluminal positioning.  Under
intermittent fluoroscopic guidance, the Kumpe catheter was advanced
through the duodenum with tip terminating within the proximal
jejunum. Contrast injection confirmed appropriate positioning.

Given recent difficulties with the gastrojejunostomy tube (recent
episode of clogging and falling out). the decision was made to
upsize the feeding tube.

Approximately 15 cm of distal tubing was cut off from a new 24-
French jejunostomy catheter.

Under intermittent fluoroscopic guidance, a new 24-French
jejunostomy catheter was advanced through the gastrostomy tract
with tip terminating within the proximal small bowel. The balloon
was inflated with a mixture of saline and small amount of contrast
and this was cinched.  Contrast injection confirmed appropriate
functioning and positioning.

A dressing was placed.  The patient tolerated procedure well
without immediate postprocedural complication.
IMPRESSION: Successful fluoroscopic guided replacement and upsizing of a now 24-
French gastrojejunostomy catheter with tip of the jejunostomy lumen
within the proximal jejunum.  Note, approximately 15 cm of distal
tubing was cut from the gastrojejunostomy catheter.

## 2014-10-18 ENCOUNTER — Encounter (HOSPITAL_COMMUNITY): Payer: Self-pay | Admitting: *Deleted

## 2014-10-18 ENCOUNTER — Emergency Department (HOSPITAL_COMMUNITY): Payer: BC Managed Care – PPO

## 2014-10-18 ENCOUNTER — Encounter (HOSPITAL_COMMUNITY): Payer: Self-pay | Admitting: Physical Medicine and Rehabilitation

## 2014-10-18 ENCOUNTER — Inpatient Hospital Stay (HOSPITAL_COMMUNITY)
Admission: EM | Admit: 2014-10-18 | Discharge: 2014-10-26 | DRG: 871 | Disposition: E | Payer: BC Managed Care – PPO | Attending: Internal Medicine | Admitting: Internal Medicine

## 2014-10-18 ENCOUNTER — Emergency Department (INDEPENDENT_AMBULATORY_CARE_PROVIDER_SITE_OTHER)
Admission: EM | Admit: 2014-10-18 | Discharge: 2014-10-18 | Disposition: A | Discharge status: Other Acute Inpatient Hospital | Payer: BC Managed Care – PPO | Source: Home / Self Care | Attending: Family Medicine | Admitting: Family Medicine

## 2014-10-18 DIAGNOSIS — Z9981 Dependence on supplemental oxygen: Secondary | ICD-10-CM

## 2014-10-18 DIAGNOSIS — J45909 Unspecified asthma, uncomplicated: Secondary | ICD-10-CM | POA: Diagnosis present

## 2014-10-18 DIAGNOSIS — G808 Other cerebral palsy: Secondary | ICD-10-CM | POA: Diagnosis present

## 2014-10-18 DIAGNOSIS — M6249 Contracture of muscle, multiple sites: Secondary | ICD-10-CM | POA: Diagnosis present

## 2014-10-18 DIAGNOSIS — G40909 Epilepsy, unspecified, not intractable, without status epilepticus: Secondary | ICD-10-CM | POA: Diagnosis present

## 2014-10-18 DIAGNOSIS — R000 Tachycardia, unspecified: Secondary | ICD-10-CM | POA: Diagnosis not present

## 2014-10-18 DIAGNOSIS — I471 Supraventricular tachycardia: Secondary | ICD-10-CM | POA: Diagnosis present

## 2014-10-18 DIAGNOSIS — L8990 Pressure ulcer of unspecified site, unspecified stage: Secondary | ICD-10-CM | POA: Insufficient documentation

## 2014-10-18 DIAGNOSIS — Z993 Dependence on wheelchair: Secondary | ICD-10-CM | POA: Diagnosis not present

## 2014-10-18 DIAGNOSIS — G4733 Obstructive sleep apnea (adult) (pediatric): Secondary | ICD-10-CM | POA: Diagnosis present

## 2014-10-18 DIAGNOSIS — Z79899 Other long term (current) drug therapy: Secondary | ICD-10-CM | POA: Diagnosis not present

## 2014-10-18 DIAGNOSIS — A419 Sepsis, unspecified organism: Secondary | ICD-10-CM | POA: Diagnosis present

## 2014-10-18 DIAGNOSIS — J189 Pneumonia, unspecified organism: Secondary | ICD-10-CM | POA: Diagnosis present

## 2014-10-18 DIAGNOSIS — F73 Profound intellectual disabilities: Secondary | ICD-10-CM | POA: Diagnosis present

## 2014-10-18 DIAGNOSIS — J9811 Atelectasis: Secondary | ICD-10-CM | POA: Diagnosis present

## 2014-10-18 DIAGNOSIS — E871 Hypo-osmolality and hyponatremia: Secondary | ICD-10-CM | POA: Diagnosis present

## 2014-10-18 DIAGNOSIS — R569 Unspecified convulsions: Secondary | ICD-10-CM | POA: Diagnosis not present

## 2014-10-18 DIAGNOSIS — Z66 Do not resuscitate: Secondary | ICD-10-CM | POA: Diagnosis present

## 2014-10-18 DIAGNOSIS — J9601 Acute respiratory failure with hypoxia: Secondary | ICD-10-CM | POA: Diagnosis present

## 2014-10-18 DIAGNOSIS — R05 Cough: Secondary | ICD-10-CM

## 2014-10-18 DIAGNOSIS — K219 Gastro-esophageal reflux disease without esophagitis: Secondary | ICD-10-CM | POA: Diagnosis present

## 2014-10-18 DIAGNOSIS — Z515 Encounter for palliative care: Secondary | ICD-10-CM | POA: Diagnosis not present

## 2014-10-18 DIAGNOSIS — M62561 Muscle wasting and atrophy, not elsewhere classified, right lower leg: Secondary | ICD-10-CM | POA: Diagnosis present

## 2014-10-18 DIAGNOSIS — H540 Blindness, both eyes: Secondary | ICD-10-CM | POA: Diagnosis present

## 2014-10-18 DIAGNOSIS — J690 Pneumonitis due to inhalation of food and vomit: Secondary | ICD-10-CM | POA: Diagnosis present

## 2014-10-18 DIAGNOSIS — Y95 Nosocomial condition: Secondary | ICD-10-CM | POA: Diagnosis present

## 2014-10-18 DIAGNOSIS — M62562 Muscle wasting and atrophy, not elsewhere classified, left lower leg: Secondary | ICD-10-CM | POA: Diagnosis present

## 2014-10-18 DIAGNOSIS — Z931 Gastrostomy status: Secondary | ICD-10-CM

## 2014-10-18 DIAGNOSIS — Z681 Body mass index (BMI) 19 or less, adult: Secondary | ICD-10-CM

## 2014-10-18 DIAGNOSIS — T17990A Other foreign object in respiratory tract, part unspecified in causing asphyxiation, initial encounter: Secondary | ICD-10-CM | POA: Diagnosis present

## 2014-10-18 DIAGNOSIS — Z882 Allergy status to sulfonamides status: Secondary | ICD-10-CM

## 2014-10-18 DIAGNOSIS — D696 Thrombocytopenia, unspecified: Secondary | ICD-10-CM | POA: Diagnosis present

## 2014-10-18 DIAGNOSIS — J9600 Acute respiratory failure, unspecified whether with hypoxia or hypercapnia: Secondary | ICD-10-CM | POA: Insufficient documentation

## 2014-10-18 DIAGNOSIS — R64 Cachexia: Secondary | ICD-10-CM | POA: Diagnosis present

## 2014-10-18 DIAGNOSIS — Z888 Allergy status to other drugs, medicaments and biological substances status: Secondary | ICD-10-CM | POA: Diagnosis not present

## 2014-10-18 DIAGNOSIS — G809 Cerebral palsy, unspecified: Secondary | ICD-10-CM | POA: Insufficient documentation

## 2014-10-18 DIAGNOSIS — R6521 Severe sepsis with septic shock: Secondary | ICD-10-CM | POA: Diagnosis present

## 2014-10-18 DIAGNOSIS — R053 Chronic cough: Secondary | ICD-10-CM

## 2014-10-18 DIAGNOSIS — I959 Hypotension, unspecified: Secondary | ICD-10-CM | POA: Diagnosis present

## 2014-10-18 DIAGNOSIS — E46 Unspecified protein-calorie malnutrition: Secondary | ICD-10-CM | POA: Diagnosis present

## 2014-10-18 LAB — CBC WITH DIFFERENTIAL/PLATELET
Basophils Absolute: 0 10*3/uL (ref 0.0–0.1)
Basophils Relative: 0 % (ref 0–1)
EOS ABS: 0.3 10*3/uL (ref 0.0–0.7)
EOS PCT: 3 % (ref 0–5)
HCT: 45.4 % (ref 39.0–52.0)
Hemoglobin: 15.8 g/dL (ref 13.0–17.0)
LYMPHS ABS: 1.2 10*3/uL (ref 0.7–4.0)
Lymphocytes Relative: 14 % (ref 12–46)
MCH: 32.2 pg (ref 26.0–34.0)
MCHC: 34.8 g/dL (ref 30.0–36.0)
MCV: 92.5 fL (ref 78.0–100.0)
MONO ABS: 1.1 10*3/uL — AB (ref 0.1–1.0)
MONOS PCT: 12 % (ref 3–12)
Neutro Abs: 6.2 10*3/uL (ref 1.7–7.7)
Neutrophils Relative %: 71 % (ref 43–77)
PLATELETS: 120 10*3/uL — AB (ref 150–400)
RBC: 4.91 MIL/uL (ref 4.22–5.81)
RDW: 13.5 % (ref 11.5–15.5)
WBC: 8.7 10*3/uL (ref 4.0–10.5)

## 2014-10-18 LAB — COMPREHENSIVE METABOLIC PANEL
ALT: 13 U/L — ABNORMAL LOW (ref 17–63)
ANION GAP: 8 (ref 5–15)
AST: 22 U/L (ref 15–41)
Albumin: 3.3 g/dL — ABNORMAL LOW (ref 3.5–5.0)
Alkaline Phosphatase: 73 U/L (ref 38–126)
BUN: <5 mg/dL — ABNORMAL LOW (ref 6–20)
CHLORIDE: 95 mmol/L — AB (ref 101–111)
CO2: 29 mmol/L (ref 22–32)
Calcium: 9.3 mg/dL (ref 8.9–10.3)
Creatinine, Ser: <0.3 mg/dL — ABNORMAL LOW (ref 0.61–1.24)
Glucose, Bld: 87 mg/dL (ref 65–99)
POTASSIUM: 4 mmol/L (ref 3.5–5.1)
SODIUM: 132 mmol/L — AB (ref 135–145)
Total Bilirubin: 1 mg/dL (ref 0.3–1.2)
Total Protein: 6.8 g/dL (ref 6.5–8.1)

## 2014-10-18 MED ORDER — POLYETHYLENE GLYCOL 3350 17 G PO PACK
17.0000 g | PACK | Freq: Every day | ORAL | Status: DC | PRN
  Filled 2014-10-18: qty 1

## 2014-10-18 MED ORDER — IPRATROPIUM-ALBUTEROL 0.5-2.5 (3) MG/3ML IN SOLN
3.0000 mL | Freq: Once | RESPIRATORY_TRACT | Status: AC
  Administered 2014-10-18: 3 mL via RESPIRATORY_TRACT
  Filled 2014-10-18: qty 3

## 2014-10-18 MED ORDER — DEXTROSE 5 % IV SOLN
1.0000 g | Freq: Once | INTRAVENOUS | Status: AC
  Administered 2014-10-18: 1 g via INTRAVENOUS
  Filled 2014-10-18: qty 10

## 2014-10-18 MED ORDER — DIVALPROEX SODIUM 125 MG PO CSDR
250.0000 mg | DELAYED_RELEASE_CAPSULE | ORAL | Status: DC
  Administered 2014-10-19 – 2014-10-22 (×4): 250 mg via ORAL
  Filled 2014-10-18 (×5): qty 2

## 2014-10-18 MED ORDER — DEXTROSE 5 % IV SOLN
500.0000 mg | Freq: Once | INTRAVENOUS | Status: AC
  Administered 2014-10-18: 500 mg via INTRAVENOUS
  Filled 2014-10-18: qty 500

## 2014-10-18 MED ORDER — DIVALPROEX SODIUM 125 MG PO CSDR
375.0000 mg | DELAYED_RELEASE_CAPSULE | Freq: Two times a day (BID) | ORAL | Status: DC
  Administered 2014-10-19 – 2014-10-22 (×8): 375 mg via ORAL
  Filled 2014-10-18 (×12): qty 3

## 2014-10-18 NOTE — ED Notes (Signed)
Pts bed being switched to step down by admitting MD.

## 2014-10-18 NOTE — ED Notes (Signed)
Pts mother states that she would prefer that the pt be on a step down bed because she does not feel that he is able to receive sufficient care on other floors.

## 2014-10-18 NOTE — H&P (Signed)
Family Medicine Teaching [facility] Admission History and Physical Service Pager: [phone]  Patient name: [name]. [name] Medical record number: [id number] Date of birth: [birth year] Age: 24 y.o. Gender: male  Primary Care Provider: Jacquiline Doe, MD Consultants: none  Code Status: Do not intubate, otherwise full code  Chief Complaint: Cough  Assessment and Plan: [name] is a 24 y.o. male presenting with cough found to have LLL PNA. PMH is significant for CP/congenital CMV, hx of recurrent PNA, and seizure disorder.   LLL PNA: Patient is afebrile and WBC 8.7. O2 sats 93-95% on [place]. CXR showed consolidation and volume loss in the LLL. These findings were present on previous study from February therefore unsure whether this is chronic or recurrent infiltrates. Mother feels the patient's previous pneumonia resolved completely and that this is a recurrent pneumonia. Patient does not take anything by mouth, however is sometimes noted to have "build up" of sometime in the arch of his hard palate. Given mental status/ability to protect his airways, patient is at risk for aspiration pneumonia as well. Patient currently on 2L as mother noted he desaturated to the 11s in the ED (this was not witnessed by anyone else). Given maternal concern and per her report, a h/o rapid decompensation, will try to admit to SDU.  -Admit to SDU, vitals per unit  -Abx therapy: Continue Azithromycin, add Clindamycin and Ceftriaxone for anaerobic and aspiration coverage  -Chest PT with vest q4 hours  -continuous pulse ox and supplemental O2 prn  -sputum culture and gram stain ordered  -legionella urine antigen and strep pneumoniae urine antigen ordered  -albuterol nebs q6 hrs prn  -repeat CBC, BMET am  - continue to monitor for fevers  Seizures: Per mom, patient has not been having increased seizure activity recently. Baseline for patient to have at least one seizure per day.  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 NPO and tube feeds. Incontinent.  -requires frequent turns; monitor for pressure ulcers  -assistance with hygiene  -continue home med of Baclofen 20 mg TID  And Rubinol -continuous tube feeds at 37 mL/hr    FEN/GI: NPO, J tube feeds  Prophylaxis: Lovenox 40 mg q24 hr   Disposition: Admit to floor; FPTS; Attending Dr. Georgette Dover   History of Present Illness: [name] is a 24 y.o. male presenting with worsening cough. Mother states it is a dry, mostly non-productive cough.  Over the weekend he was noted to be moaning when he was placed on his left side and it was difficult to get him comfortable. He had a bad breathing episode yesterday. He was getting red and had difficulty breathing. His mother gave him an albuterol treatment. His O2 was in the 80s but started to increase to the 90s at that time. Mother and father felt that today, his coughing became worse and given his complicated history of "getting sick quickly" he was brought into the ED.  No hemoptysis or emesis. No runny nose. He had a low grade fever between 99-100F. Mom feels he may be more somnolent and moans more (how he lets his mother know he's uncomfortable). Of note, his father is sick with URI-like symptoms.   He does not use a CPAP at home for his sleep apnea. He's on 2L [place] at night.   At baseline he's nonverbal, no  use of his hands, is wheelchair bound. He is cortically blind. He's on continuous feeds with Jevity at 92ml/hr however his mother stopped this when she got home today. No excess secretions noted, he's still on Rubinol. He does not take anything orally. Mom does not that he occasionally gets build up of "gunk" on the roof of his mouth that needs cleaning. It is unclear the cause of this gunk since patient is NPO.  He gets chest PT with a vest  TID at home. He has been hospitalized in the past for similar symptoms/PNA. The patient has never been intubated.    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 Last grand mal seizure has been in the last week. The mother, father, son, and babysitter all take care of the patient.   Review Of Systems: Per HPI with the following additions: Constipation recently. No diarrhea. Mother says urination has been normal.   Otherwise 12 point review of systems was performed and was unremarkable.  Patient Active Problem List   Diagnosis Date Noted  . Drooling 06/06/2014  . Aspiration pneumonia 05/02/2014  . Severe sepsis   . Hyponatremia 04/22/2014  . Sepsis 04/22/2014  . CAP (community acquired pneumonia) 04/22/2014  . Scrotal ulcer 03/30/2014  . Cough 12/12/2013  . Hand edema 03/01/2013  . Seborrheic dermatitis 02/04/2013  . Rash of hands 08/24/2012  . Pressure ulcer, stage 2 08/24/2012  . 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 Encounter for long-term (current) use of other medications 08/18/2012  . Encounter for care related to feeding tube 06/16/2012  . Anemia 05/13/2012  . Thrombocytopenia, unspecified 05/13/2012  . Hypokalemia 05/13/2012  . Dysphagia 10/20/2011  . SLEEP APNEA, OBSTRUCTIVE 06/13/2009  . Wheelchair dependence 06/13/2009  . Mental retardation 05/30/2009  . Infantile cerebral palsy 05/30/2009  . 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 HEARING IMPAIRMENT 05/30/2009  . 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 SEIZURE DISORDER 05/30/2009   Past Medical History: Past Medical History  Diagnosis Date  . Allergy   . Asthma   . Cerebral palsy, quadriplegic   . Congenital CMV infection   . Neuromuscular disorder      cp  . H/O hiatal hernia   . Pneumonia     "he's had it several times; maybe now" (12/17/2011)  . Community acquired pneumonia 06/30/2011    Psuedomonas PNA 06/2011 >tx 21 d of Cipro  CXR 08/15/11 >clearance of PNA    . GERD (gastroesophageal reflux disease)   . Seizures     "has had gran mals before" (12/17/2011)  . Cytomegalovirus   . On home oxygen therapy     "at night" (12/17/2011"  . Jejunostomy tube present   . Acute and chronic respiratory failure 12/24/2011   Past Surgical History: Past Surgical History  Procedure Laterality Date  . Nissen fundoplication    . Rod  ~ 2003 "or before"    "in his back" (12/17/2011  . Peg tube placement    . Percutaneous gastrotomy tube replacement    . Circumcision      at birth   Social History: Social History  Substance Use Topics  . Smoking status: Never Smoker   . Smokeless tobacco: Never Used  . Alcohol Use: No   Additional social history: none   Please also refer to relevant sections of EMR.  Family  History: Family History  Problem Relation Age of Onset  . Allergies Mother   . Heart disease Maternal Uncle   . Colon cancer Paternal Grandmother     Died at 35  . Diabetes Father    Allergies and Medications: Allergies  Allergen Reactions  . Sulfamethoxazole-Trimethoprim Rash  . Topamax [Topiramate] Other (See Comments)   No current facility-administered medications on file prior to encounter.   Current Outpatient Prescriptions on File Prior to Encounter  Medication Sig Dispense Refill  . albuterol (PROVENTIL) (2.5 MG/3ML) 0.083% nebulizer solution Take 3 mLs (2.5 mg total) by nebulization every 6 (six) hours as needed for wheezing or shortness of breath. 150 mL 1  . baclofen (LIORESAL) 10 MG tablet CRUSH 2 TABLETS AND TAKE BY GASTROSTOMY TUBE 3 TIMES DAILY 186 tablet 5  . DEPAKOTE SPRINKLES 125 MG capsule TAKE 3 CAPSULES EVERY MORNING, TAKE 2 CAPSULES MIDDAY, AND TAKE 3 CAPSULES AT BEDTIME 250 capsule 5  . diazepam  (DIASAT) 20 MG GEL USE 20 MG RECTALLY AFTER 2 MINUTES FOR CONTINUOUS GENERALIZED SEIZURE 2 Package 5  . Feeding Tubes - Pump (KANGAROO JOEY ENTERAL PUMP) MISC 30 Units by Does not apply route daily. Please provide bags for Kangroo Joey enteral feeding device. Should be changed daily. 30 each 12  . gabapentin (NEURONTIN) 300 MG capsule TAKE ONE CAPSULE PER TUBE 3 TIMES A DAY 90 capsule 3  . glycopyrrolate (ROBINUL) 2 MG tablet TAKE 1 TABLET BY MOUTH PER G-TUBE TWICE A DAY 62 tablet 5  . Hydrocortisone (GERHARDT'S BUTT CREAM) CREA Apply 1 application topically 2 (two) times daily as needed for irritation. Please provide remained of tube used in hospital    . Nutritional Supplements (FEEDING SUPPLEMENT, JEVITY 1.2 CAL,) LIQD Place 1,000 mLs into feeding tube continuous.    Marland Kitchen nystatin cream (MYCOSTATIN) Apply topically 2 (two) times daily as needed (Redness). 30 g 1  . omeprazole (PRILOSEC) 2 mg/mL SUSP Take 10 mLs (20 mg total) by mouth daily at 12 noon. (Patient taking differently: Place 20 mg into feeding tube daily at 12 noon. ) 150 mL 11  . azithromycin (ZITHROMAX) 250 MG tablet 1 tablet daily. 3 tablet 0  . clindamycin (CLEOCIN) 300 MG capsule Take 1 capsule (300 mg total) by mouth every 6 (six) hours. 20 capsule 0  . Incontinence Supplies MISC May have incontinence supplies as determined by home health agency. 30 each 0  . Oral Hygiene Products (OROPHARYNGEAL SUCTION CATHETER) MISC Use as directed 1 Device in the mouth or throat daily as needed. 3 each 0    Objective: BP 100/71 mmHg  Pulse 99  Temp(Src) 97.6 F (36.4 C) (Axillary)  Resp 15  SpO2 95% Exam: General: Sick appearing male lying in bed, moaning. Cachetic  Eyes: PEERL ENTM: Right ear canal impacted with cerumen. Left TM normal. Oropharynx clear and without excessive secretions. Very arched hard palate. Poor dentition.  Neck: No lymphadenopathy.  Cardiovascular: RRR. No murmurs appreciated. Pedal pulses intact and equal.   Respiratory: Diminished breath sounds on left side. Rhonchi throughout both lung fields, but more prominent on left.  Abdomen: soft, ND, + bs, J tube in place with some yellow-green drainage  MSK: Contractures of all 4 extremities, most prominent in the R hand. LEs atrophied with significant deformations of the feet/legs.  Skin: cool and dry  Neuro: Non-responsive to verbal commands. Opens eyes in response to touch and voice.  Psych: Non-verbal at baseline   Labs and Imaging: CBC BMET   Recent Labs  Lab Nov 05, 2014 1810  WBC 8.7  HGB 15.8  HCT 45.4  PLT 120*    Recent Labs Lab 11-05-2014 1810  NA 132*  K 4.0  CL 95*  CO2 29  BUN <5*  CREATININE <0.30*  GLUCOSE 87  CALCIUM 9.3     Dg Chest 2 View  Nov 05, 2014   CLINICAL DATA:  Cough for 2 days. Fever today. History of pneumonia. History of cerebral palsy, quadriplegic, congenital CMV infection, hiatal hernia.  EXAM: CHEST  2 VIEW  COMPARISON:  04/22/2014  FINDINGS: There is consolidation and volume loss in the left lung base behind the heart consistent with left lower lobe collapse. This was present on the previous study suggesting a possible chronic versus recurrent process. Right lung is clear. No blunting of costophrenic angles. No pneumothorax. Normal heart size and pulmonary vascularity. Postoperative changes with posterior fixation of the thoracic spine. Enteric tube partially visualized in the left upper quadrant.  IMPRESSION: Consolidation and volume loss in the left lower lung. This was present on the previous study, suggesting a possible chronic versus recurrent process.   Electronically Signed   By: Burman Nieves M.D.   On: 05-Nov-2014 18:27    Arvilla Market, DO Nov 05, 2014, 9:44 PM PGY-1, [facility] FPTS Intern pager: [phone], text pages welcome  Upper Level Addendum:  I have seen and evaluated this patient along with Dr. Earlene Plater and reviewed the above note, making necessary revisions in  purple.   Joanna Puff, MD [facility] Family Medicine Resident, PGY-2

## 2014-10-18 NOTE — ED Notes (Signed)
Pt presents to department for evaluation of cough and fever. Mother is concerned he could have pneumonia, requesting chest x-ray, unable to see PCP today. Pt is non verbal, coughing in triage. No signs of acute distress at present.

## 2014-10-18 NOTE — ED Notes (Signed)
Pts mother states that the pt received advil around 2 pm today and has not had his "milk" since about 2 pm.

## 2014-10-18 NOTE — ED Notes (Signed)
Admitting at bedside 

## 2014-10-18 NOTE — ED Provider Notes (Signed)
CSN: [id number]     Arrival date & time 09/26/2014  1514 History   First MD Initiated Contact with Patient 10/19/2014 1643     No chief complaint on file.  (Consider location/radiation/quality/duration/timing/severity/associated sxs/prior Treatment) Patient is a 24 y.o. male presenting with cough. The history is provided by a parent.  Cough Cough characteristics:  Hacking and dry Severity:  Moderate Onset quality:  Gradual Duration:  2 days Progression:  Worsening Chronicity:  New Smoker: no   Context comment:  Boy with CP in wheelchair, nonverbal, recent change in usual state. Relieved by:  None tried Worsened by:  Nothing tried Ineffective treatments:  None tried Associated symptoms: fever   Associated symptoms: no shortness of breath, no sinus congestion and no wheezing     Past Medical History  Diagnosis Date  . Allergy   . Asthma   . Cerebral palsy, quadriplegic   . Congenital CMV infection   . Neuromuscular disorder     cp  . H/O hiatal hernia   . Pneumonia     "he's had it several times; maybe now" (12/17/2011)  . Community acquired pneumonia 06/30/2011    Psuedomonas PNA 06/2011 >tx 21 d of Cipro  CXR 08/15/11 >clearance of PNA    . GERD (gastroesophageal reflux disease)   . Seizures     "has had gran mals before" (12/17/2011)  . Cytomegalovirus   . On home oxygen therapy     "at night" (12/17/2011"  . Jejunostomy tube present   . Acute and chronic respiratory failure 12/24/2011   Past Surgical History  Procedure Laterality Date  . Nissen fundoplication    . Rod  ~ 2003 "or before"    "in his back" (12/17/2011  . Peg tube placement    . Percutaneous gastrotomy tube replacement    . Circumcision      at birth   Family History  Problem Relation Age of Onset  . Allergies Mother   . Heart disease Maternal Uncle   . Colon cancer Paternal Grandmother     Died at 41  . Diabetes Father    Social History  Substance Use Topics  . Smoking status: Never Smoker    . Smokeless tobacco: Never Used  . Alcohol Use: No    Review of Systems  Constitutional: Positive for fever.  HENT: Positive for congestion.   Respiratory: Positive for cough. Negative for shortness of breath and wheezing.   Cardiovascular: Negative.   Gastrointestinal: Negative.     Allergies  Sulfamethoxazole-trimethoprim and Topamax  Home Medications   Prior to Admission medications   Medication Sig Start Date End Date Taking? Authorizing Provider  albuterol (PROVENTIL) (2.5 MG/3ML) 0.083% nebulizer solution Take 3 mLs (2.5 mg total) by nebulization every 6 (six) hours as needed for wheezing or shortness of breath. 12/12/13   Glori Luis, MD  azithromycin (ZITHROMAX) 250 MG tablet 1 tablet daily. 04/25/14   Kathee Delton, MD  baclofen (LIORESAL) 10 MG tablet CRUSH 2 TABLETS AND TAKE BY GASTROSTOMY TUBE 3 TIMES DAILY 06/06/14   Deetta Perla, MD  clindamycin (CLEOCIN) 300 MG capsule Take 1 capsule (300 mg total) by mouth every 6 (six) hours. 04/25/14   Kathee Delton, MD  DEPAKOTE SPRINKLES 125 MG capsule TAKE 3 CAPSULES EVERY MORNING, TAKE 2 CAPSULES MIDDAY, AND TAKE 3 CAPSULES AT BEDTIME 06/06/14   Deetta Perla, MD  DIASTAT ACUDIAL 10 MG GEL Give  rectally for seizures 2 minutes or longer 12/28/13   Inetta Fermo  Goodpasture, NP  diazepam (DIASAT) 20 MG GEL USE 20 MG RECTALLY AFTER 2 MINUTES FOR CONTINUOUS GENERALIZED SEIZURE 07/11/14   Elveria Rising, NP  Feeding Tubes - Pump (KANGAROO JOEY ENTERAL PUMP) MISC 30 Units by Does not apply route daily. Please provide bags for Kangroo Joey enteral feeding device. Should be changed daily. 10/20/11   Shelly Rubenstein, MD  gabapentin (NEURONTIN) 300 MG capsule TAKE ONE CAPSULE PER TUBE 3 TIMES A DAY 07/28/14   Elveria Rising, NP  glycopyrrolate (ROBINUL) 2 MG tablet TAKE 1 TABLET BY MOUTH PER G-TUBE TWICE A DAY 06/06/14   Deetta Perla, MD  Hydrocortisone (GERHARDT'S BUTT CREAM) CREA Apply 1 application topically 2 (two) times daily  as needed for irritation. Please provide remained of tube used in hospital 07/08/11   Andrena Mews, MD  ibuprofen (ADVIL,MOTRIN) 100 MG/5ML suspension Place 300 mg into feeding tube every 4 (four) hours as needed for fever.    Historical Provider, MD  Incontinence Supplies MISC May have incontinence supplies as determined by home health agency. 02/11/12   Shelly Rubenstein, MD  Nutritional Supplements (FEEDING SUPPLEMENT, JEVITY 1.2 CAL,) LIQD Place 1,000 mLs into feeding tube continuous. 05/07/12   Glori Luis, MD  nystatin cream (MYCOSTATIN) Apply topically 2 (two) times daily as needed (Redness). 01/05/12   Leona Singleton, MD  omeprazole (PRILOSEC) 2 mg/mL SUSP Take 10 mLs (20 mg total) by mouth daily at 12 noon. Patient taking differently: Place 20 mg into feeding tube daily at 12 noon.  11/14/13   Ardith Dark, MD  Oral Hygiene Products (OROPHARYNGEAL SUCTION CATHETER) MISC Use as directed 1 Device in the mouth or throat daily as needed. 12/15/13   Glori Luis, MD   There were no vitals taken for this visit. Physical Exam  Constitutional: Vital signs are normal. He appears cachectic. He has a sickly appearance.  Neck: Normal range of motion. Neck supple.  Pulmonary/Chest: He has decreased breath sounds. He has rhonchi in the left middle field. He has no rales.  Skin: Skin is warm and dry.  Nursing note and vitals reviewed.   ED Course  Procedures (including critical care time) Labs Review Labs Reviewed - No data to display  Imaging Review No results found.   MDM   1. Persistent cough    Sent for cxr eval of cough, fever , h/o pna, mother worried.   Linna Hoff, MD 09/26/2014 [phone]

## 2014-10-18 NOTE — ED Notes (Signed)
Pt  Has  Symptoms  Of cough  /  Congested   Has  A  History  Of   Congenital  Problems  And  Is   Confined  To  A  Chair           Pt  Has  A   Stomach  Tube       And  Has  A  Productive  Cough  At times   Caregiver  Is  At  Bedside

## 2014-10-18 NOTE — ED Provider Notes (Signed)
CSN: [id number]     Arrival date & time 09/27/2014  1715 History   First MD Initiated Contact with Patient 09/30/2014 2104     Chief Complaint  Patient presents with  . Cough     (Consider location/radiation/quality/duration/timing/severity/associated sxs/prior Treatment) Patient is a 24 y.o. male presenting with cough. The history is provided by a parent. The history is limited by the condition of the patient (Cerebral palsy, a phasic).  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 Mother noted that he has been coughing and not acting like his normal self for last 2 days. He is run low-grade fevers as high as 99 2. She is very concerned that he might have pneumonia. At home, oxygen saturation dropped down into the 70s. He has oxygen at home which he uses only at night. He was put back on the oxygen. He went to urgent care center where he was seen and referred to the emergency department.  Past Medical History  Diagnosis Date  . Allergy   . Asthma   . Cerebral palsy, quadriplegic   . Congenital CMV infection   . Neuromuscular disorder     cp  . H/O hiatal hernia   . Pneumonia     "he's had it several times; maybe now" (12/17/2011)  . Community acquired pneumonia 06/30/2011    Psuedomonas PNA 06/2011 >tx 21 d of Cipro  CXR 08/15/11 >clearance of PNA    . GERD (gastroesophageal reflux disease)   . Seizures     "has had gran mals before" (12/17/2011)  . Cytomegalovirus   . On home oxygen therapy     "at night" (12/17/2011"  . Jejunostomy tube present   . Acute and chronic respiratory failure 12/24/2011   Past Surgical History  Procedure Laterality Date  . Nissen fundoplication    . Rod  ~ 2003 "or before"    "in his back" (12/17/2011  . Peg tube placement    . Percutaneous gastrotomy tube replacement    . Circumcision      at birth   Family History  Problem Relation Age of Onset  . Allergies Mother   . Heart disease Maternal Uncle   . Colon cancer Paternal Grandmother      Died at 68  . Diabetes Father    Social History  Substance Use Topics  . Smoking status: Never Smoker   . Smokeless tobacco: Never Used  . Alcohol Use: No    Review of Systems  Unable to perform ROS: Patient nonverbal  Respiratory: Positive for cough.       Allergies  Sulfamethoxazole-trimethoprim and Topamax  Home Medications   Prior to Admission medications   Medication Sig Start Date End Date Taking? Authorizing Provider  albuterol (PROVENTIL) (2.5 MG/3ML) 0.083% nebulizer solution Take 3 mLs (2.5 mg total) by nebulization every 6 (six) hours as needed for wheezing or shortness of breath. 12/12/13  Yes Glori Luis, MD  baclofen (LIORESAL) 10 MG tablet CRUSH 2 TABLETS AND TAKE BY GASTROSTOMY TUBE 3 TIMES DAILY 06/06/14  Yes Deetta Perla, MD  DEPAKOTE SPRINKLES 125 MG capsule TAKE 3 CAPSULES EVERY MORNING, TAKE 2 CAPSULES MIDDAY, AND TAKE 3 CAPSULES AT BEDTIME 06/06/14  Yes Deetta Perla, MD  diazepam (DIASAT) 20 MG GEL USE 20 MG RECTALLY AFTER 2 MINUTES FOR CONTINUOUS GENERALIZED SEIZURE 07/11/14  Yes Elveria Rising, NP  Feeding Tubes - Pump (KANGAROO JOEY ENTERAL PUMP) MISC 30 Units by Does not apply route daily. Please provide bags for  Kangroo Joey enteral feeding device. Should be changed daily. 10/20/11  Yes Shelly Rubenstein, MD  gabapentin (NEURONTIN) 300 MG capsule TAKE ONE CAPSULE PER TUBE 3 TIMES A DAY 07/28/14  Yes Elveria Rising, NP  glycopyrrolate (ROBINUL) 2 MG tablet TAKE 1 TABLET BY MOUTH PER G-TUBE TWICE A DAY 06/06/14  Yes Deetta Perla, MD  Hydrocortisone (GERHARDT'S BUTT CREAM) CREA Apply 1 application topically 2 (two) times daily as needed for irritation. Please provide remained of tube used in hospital 07/08/11  Yes Andrena Mews, MD  Nutritional Supplements (FEEDING SUPPLEMENT, JEVITY 1.2 CAL,) LIQD Place 1,000 mLs into feeding tube continuous. 05/07/12  Yes Glori Luis, MD  nystatin cream (MYCOSTATIN) Apply topically 2 (two) times  daily as needed (Redness). 01/05/12  Yes Leona Singleton, MD  omeprazole (PRILOSEC) 2 mg/mL SUSP Take 10 mLs (20 mg total) by mouth daily at 12 noon. Patient taking differently: Place 20 mg into feeding tube daily at 12 noon.  11/14/13  Yes Ardith Dark, MD  azithromycin (ZITHROMAX) 250 MG tablet 1 tablet daily. 04/25/14   Kathee Delton, MD  clindamycin (CLEOCIN) 300 MG capsule Take 1 capsule (300 mg total) by mouth every 6 (six) hours. 04/25/14   Kathee Delton, MD  Incontinence Supplies MISC May have incontinence supplies as determined by home health agency. 02/11/12   Shelly Rubenstein, MD  Oral Hygiene Products (OROPHARYNGEAL SUCTION CATHETER) MISC Use as directed 1 Device in the mouth or throat daily as needed. 12/15/13   Glori Luis, MD   BP 100/71 mmHg  Pulse 99  Temp(Src) 97.6 F (36.4 C) (Axillary)  Resp 15  SpO2 95% Physical Exam  Nursing note and vitals reviewed.  24 year old male, resting comfortably and in no acute distress. Vital signs are normal. Oxygen saturation is 95%, which is normal. Head is normocephalic and atraumatic. PERRLA, EOMI. Oropharynx is clear. Neck is nontender and supple without adenopathy or JVD. Back is nontender. Lungs have coarse rhonchi throughout. Markedly diminished breath sounds on the left. Chest is nontender. Heart has regular rate and rhythm without murmur. Abdomen is soft, flat, nontender without masses or hepatosplenomegaly and peristalsis is normoactive. PEG tube is present. Extremities are severely contracted. Skin is warm and dry without rash. Neurologic: He is awake but not responsive to his environment. Spastic quadriplegia is present.  ED Course  Procedures (including critical care time) Labs Review Results for orders placed or performed during the hospital encounter of October 31, 2014  CBC with Differential  Result Value Ref Range   WBC 8.7 4.0 - 10.5 K/uL   RBC 4.91 4.22 - 5.81 MIL/uL   Hemoglobin 15.8 13.0 - 17.0 g/dL   HCT  16.1 09.6 - 04.5 %   MCV 92.5 78.0 - 100.0 fL   MCH 32.2 26.0 - 34.0 pg   MCHC 34.8 30.0 - 36.0 g/dL   RDW 40.9 81.1 - 91.4 %   Platelets 120 (L) 150 - 400 K/uL   Neutrophils Relative % 71 43 - 77 %   Neutro Abs 6.2 1.7 - 7.7 K/uL   Lymphocytes Relative 14 12 - 46 %   Lymphs Abs 1.2 0.7 - 4.0 K/uL   Monocytes Relative 12 3 - 12 %   Monocytes Absolute 1.1 (H) 0.1 - 1.0 K/uL   Eosinophils Relative 3 0 - 5 %   Eosinophils Absolute 0.3 0.0 - 0.7 K/uL   Basophils Relative 0 0 - 1 %   Basophils Absolute  0.0 0.0 - 0.1 K/uL  Comprehensive metabolic panel  Result Value Ref Range   Sodium 132 (L) 135 - 145 mmol/L   Potassium 4.0 3.5 - 5.1 mmol/L   Chloride 95 (L) 101 - 111 mmol/L   CO2 29 22 - 32 mmol/L   Glucose, Bld 87 65 - 99 mg/dL   BUN <5 (L) 6 - 20 mg/dL   Creatinine, Ser <1.61 (L) 0.61 - 1.24 mg/dL   Calcium 9.3 8.9 - 09.6 mg/dL   Total Protein 6.8 6.5 - 8.1 g/dL   Albumin 3.3 (L) 3.5 - 5.0 g/dL   AST 22 15 - 41 U/L   ALT 13 (L) 17 - 63 U/L   Alkaline Phosphatase 73 38 - 126 U/L   Total Bilirubin 1.0 0.3 - 1.2 mg/dL   GFR calc non Af Amer NOT CALCULATED >60 mL/min   GFR calc Af Amer NOT CALCULATED >60 mL/min   Anion gap 8 5 - 15   Imaging Review Dg Chest 2 View  10/05/2014   CLINICAL DATA:  Cough for 2 days. Fever today. History of pneumonia. History of cerebral palsy, quadriplegic, congenital CMV infection, hiatal hernia.  EXAM: CHEST  2 VIEW  COMPARISON:  04/22/2014  FINDINGS: There is consolidation and volume loss in the left lung base behind the heart consistent with left lower lobe collapse. This was present on the previous study suggesting a possible chronic versus recurrent process. Right lung is clear. No blunting of costophrenic angles. No pneumothorax. Normal heart size and pulmonary vascularity. Postoperative changes with posterior fixation of the thoracic spine. Enteric tube partially visualized in the left upper quadrant.  IMPRESSION: Consolidation and volume loss in  the left lower lung. This was present on the previous study, suggesting a possible chronic versus recurrent process.   Electronically Signed   By: Burman Nieves M.D.   On: 10/09/2014 18:27   I have personally reviewed and evaluated these images and lab results as part of my medical decision-making.  MDM   Final diagnoses:  Community acquired pneumonia  Congenital quadriplegia    Cough with x-ray evidence of left lower lobe pneumonia and physical findings supporting this. In spite of normal WBC, he has fragile respiratory health and mother states that he has a history of deteriorating quickly. He did desaturate while in the room only witnessed by his mother and she placed back on nasal oxygen. Old records are reviewed and he had been admitted 6 months ago for pneumonia. X-ray at that time is very similar to x-ray today. He is started on anabolic for community-acquired pneumonia and will be admitted. Case is discussed with Dr. Leonides Schanz of [facility], who agrees to admit the patient.    Dione Booze, MD Oct 23, 2014 2214

## 2014-10-19 ENCOUNTER — Inpatient Hospital Stay (HOSPITAL_COMMUNITY): Payer: BC Managed Care – PPO

## 2014-10-19 DIAGNOSIS — R000 Tachycardia, unspecified: Secondary | ICD-10-CM | POA: Insufficient documentation

## 2014-10-19 DIAGNOSIS — G809 Cerebral palsy, unspecified: Secondary | ICD-10-CM | POA: Insufficient documentation

## 2014-10-19 DIAGNOSIS — L8990 Pressure ulcer of unspecified site, unspecified stage: Secondary | ICD-10-CM | POA: Insufficient documentation

## 2014-10-19 DIAGNOSIS — J189 Pneumonia, unspecified organism: Secondary | ICD-10-CM

## 2014-10-19 DIAGNOSIS — R569 Unspecified convulsions: Secondary | ICD-10-CM

## 2014-10-19 DIAGNOSIS — G808 Other cerebral palsy: Secondary | ICD-10-CM

## 2014-10-19 LAB — BASIC METABOLIC PANEL
Anion gap: 8 (ref 5–15)
BUN: <5 mg/dL — ABNORMAL LOW (ref 6–20)
CALCIUM: 9.5 mg/dL (ref 8.9–10.3)
CO2: 29 mmol/L (ref 22–32)
CREATININE: 0.32 mg/dL — AB (ref 0.61–1.24)
Chloride: 96 mmol/L — ABNORMAL LOW (ref 101–111)
GFR calc non Af Amer: >60 mL/min (ref >60)
Glucose, Bld: 103 mg/dL — ABNORMAL HIGH (ref 65–99)
Potassium: 3.9 mmol/L (ref 3.5–5.1)
SODIUM: 133 mmol/L — AB (ref 135–145)

## 2014-10-19 LAB — CBC
HEMATOCRIT: 45.8 % (ref 39.0–52.0)
Hemoglobin: 15.8 g/dL (ref 13.0–17.0)
MCH: 31.9 pg (ref 26.0–34.0)
MCHC: 34.5 g/dL (ref 30.0–36.0)
MCV: 92.5 fL (ref 78.0–100.0)
Platelets: 146 10*3/uL — ABNORMAL LOW (ref 150–400)
RBC: 4.95 MIL/uL (ref 4.22–5.81)
RDW: 13.7 % (ref 11.5–15.5)
WBC: 10.1 10*3/uL (ref 4.0–10.5)

## 2014-10-19 LAB — VALPROIC ACID LEVEL: Valproic Acid Lvl: 61 ug/mL (ref 50.0–100.0)

## 2014-10-19 LAB — PROTIME-INR
INR: 1.2 (ref 0.00–1.49)
Prothrombin Time: 15.4 seconds — ABNORMAL HIGH (ref 11.6–15.2)

## 2014-10-19 LAB — APTT: aPTT: 32 seconds (ref 24–37)

## 2014-10-19 LAB — CREATININE, SERUM: Creatinine, Ser: <0.3 mg/dL — ABNORMAL LOW (ref 0.61–1.24)

## 2014-10-19 LAB — GLUCOSE, CAPILLARY: GLUCOSE-CAPILLARY: 113 mg/dL — AB (ref 65–99)

## 2014-10-19 LAB — LACTIC ACID, PLASMA: LACTIC ACID, VENOUS: 1.4 mmol/L (ref 0.5–2.0)

## 2014-10-19 LAB — STREP PNEUMONIAE URINARY ANTIGEN: STREP PNEUMO URINARY ANTIGEN: NEGATIVE

## 2014-10-19 LAB — MRSA PCR SCREENING: MRSA by PCR: NEGATIVE

## 2014-10-19 MED ORDER — LEVALBUTEROL HCL 0.63 MG/3ML IN NEBU
0.6300 mg | INHALATION_SOLUTION | Freq: Four times a day (QID) | RESPIRATORY_TRACT | Status: DC
  Administered 2014-10-19 – 2014-10-21 (×6): 0.63 mg via RESPIRATORY_TRACT
  Filled 2014-10-19 (×20): qty 3

## 2014-10-19 MED ORDER — LORAZEPAM 2 MG/ML IJ SOLN: 4.0000 mg | INTRAMUSCULAR | Status: DC | PRN

## 2014-10-19 MED ORDER — DEXTROSE 5 % IV SOLN
1.0000 g | Freq: Three times a day (TID) | INTRAVENOUS | Status: DC
  Administered 2014-10-19 – 2014-10-22 (×11): 1 g via INTRAVENOUS
  Filled 2014-10-19 (×15): qty 1

## 2014-10-19 MED ORDER — GABAPENTIN 300 MG PO CAPS
300.0000 mg | ORAL_CAPSULE | Freq: Once | ORAL | Status: AC
  Administered 2014-10-19: 300 mg
  Filled 2014-10-19: qty 1

## 2014-10-19 MED ORDER — GABAPENTIN 300 MG PO CAPS
300.0000 mg | ORAL_CAPSULE | Freq: Three times a day (TID) | ORAL | Status: DC
  Administered 2014-10-19 – 2014-10-22 (×12): 300 mg
  Filled 2014-10-19 (×15): qty 1

## 2014-10-19 MED ORDER — SODIUM CHLORIDE 0.9 % IV BOLUS (SEPSIS)
500.0000 mL | Freq: Once | INTRAVENOUS | Status: AC
  Administered 2014-10-19: 500 mL via INTRAVENOUS

## 2014-10-19 MED ORDER — ACETAMINOPHEN 650 MG RE SUPP: 650.0000 mg | Freq: Four times a day (QID) | RECTAL | Status: DC | PRN

## 2014-10-19 MED ORDER — LORAZEPAM 2 MG/ML IJ SOLN
INTRAMUSCULAR | Status: AC
  Filled 2014-10-19: qty 1

## 2014-10-19 MED ORDER — ALBUTEROL SULFATE (2.5 MG/3ML) 0.083% IN NEBU: 2.5000 mg | INHALATION_SOLUTION | Freq: Four times a day (QID) | RESPIRATORY_TRACT | Status: DC | PRN

## 2014-10-19 MED ORDER — KETOROLAC TROMETHAMINE 60 MG/2ML IM SOLN
60.0000 mg | Freq: Once | INTRAMUSCULAR | Status: DC
  Filled 2014-10-19: qty 2

## 2014-10-19 MED ORDER — MORPHINE SULFATE (PF) 2 MG/ML IV SOLN
2.0000 mg | Freq: Once | INTRAVENOUS | Status: AC
  Administered 2014-10-19: 2 mg via INTRAVENOUS

## 2014-10-19 MED ORDER — PHENYLEPHRINE HCL 10 MG/ML IJ SOLN: 30.0000 ug/min | INTRAVENOUS | Status: DC

## 2014-10-19 MED ORDER — VANCOMYCIN HCL IN DEXTROSE 1-5 GM/200ML-% IV SOLN
1000.0000 mg | Freq: Once | INTRAVENOUS | Status: AC
  Administered 2014-10-19: 1000 mg via INTRAVENOUS
  Filled 2014-10-19: qty 200

## 2014-10-19 MED ORDER — ENOXAPARIN SODIUM 30 MG/0.3ML ~~LOC~~ SOLN
30.0000 mg | Freq: Every day | SUBCUTANEOUS | Status: DC
  Administered 2014-10-19 – 2014-10-21 (×3): 30 mg via SUBCUTANEOUS
  Filled 2014-10-19 (×3): qty 0.3

## 2014-10-19 MED ORDER — DEXTROSE 5 % IV SOLN
1.0000 g | INTRAVENOUS | Status: DC
  Filled 2014-10-19: qty 10

## 2014-10-19 MED ORDER — JEVITY 1.2 CAL PO LIQD
1000.0000 mL | ORAL | Status: DC
  Administered 2014-10-19: 1000 mL
  Filled 2014-10-19 (×4): qty 1000

## 2014-10-19 MED ORDER — FREE WATER
60.0000 mL | Status: DC
  Administered 2014-10-19 – 2014-10-21 (×21): 60 mL

## 2014-10-19 MED ORDER — LEVALBUTEROL HCL 0.63 MG/3ML IN NEBU
0.6300 mg | INHALATION_SOLUTION | RESPIRATORY_TRACT | Status: DC | PRN
Start: 2014-10-19 — End: 2014-10-23
  Administered 2014-10-19 – 2014-10-20 (×6): 0.63 mg via RESPIRATORY_TRACT
  Filled 2014-10-19 (×5): qty 3

## 2014-10-19 MED ORDER — SODIUM CHLORIDE 0.9 % IV SOLN
500.0000 mg | Freq: Three times a day (TID) | INTRAVENOUS | Status: DC
  Administered 2014-10-19 – 2014-10-20 (×4): 500 mg via INTRAVENOUS
  Filled 2014-10-19 (×7): qty 500

## 2014-10-19 MED ORDER — SODIUM BICARBONATE 8.4 % IV SOLN
INTRAVENOUS | Status: AC
  Administered 2014-10-19: 100 meq
  Filled 2014-10-19: qty 100

## 2014-10-19 MED ORDER — GABAPENTIN 250 MG/5ML PO SOLN: 300.0000 mg | ORAL | Status: DC

## 2014-10-19 MED ORDER — LORAZEPAM 2 MG/ML IJ SOLN
1.0000 mg | Freq: Once | INTRAMUSCULAR | Status: AC
  Administered 2014-10-19: 1 mg via INTRAVENOUS

## 2014-10-19 MED ORDER — CETYLPYRIDINIUM CHLORIDE 0.05 % MT LIQD
7.0000 mL | Freq: Two times a day (BID) | OROMUCOSAL | Status: DC
  Administered 2014-10-19 – 2014-10-22 (×7): 7 mL via OROMUCOSAL

## 2014-10-19 MED ORDER — LEVOFLOXACIN IN D5W 750 MG/150ML IV SOLN
750.0000 mg | INTRAVENOUS | Status: DC
  Administered 2014-10-19 – 2014-10-20 (×2): 750 mg via INTRAVENOUS
  Filled 2014-10-19 (×2): qty 150

## 2014-10-19 MED ORDER — NYSTATIN 100000 UNIT/GM EX CREA: TOPICAL_CREAM | Freq: Two times a day (BID) | CUTANEOUS | Status: DC | PRN

## 2014-10-19 MED ORDER — SODIUM CHLORIDE 0.9 % IJ SOLN
3.0000 mL | Freq: Two times a day (BID) | INTRAMUSCULAR | Status: DC
  Administered 2014-10-19: 3 mL via INTRAVENOUS
  Administered 2014-10-19: 10 mL via INTRAVENOUS
  Administered 2014-10-20 – 2014-10-22 (×5): 3 mL via INTRAVENOUS

## 2014-10-19 MED ORDER — LORAZEPAM 2 MG/ML IJ SOLN: 2.0000 mg | INTRAMUSCULAR | Status: DC | PRN

## 2014-10-19 MED ORDER — LEVALBUTEROL HCL 0.63 MG/3ML IN NEBU
0.6300 mg | INHALATION_SOLUTION | Freq: Two times a day (BID) | RESPIRATORY_TRACT | Status: DC
  Administered 2014-10-19: 0.63 mg via RESPIRATORY_TRACT
  Filled 2014-10-19: qty 3

## 2014-10-19 MED ORDER — PHENYLEPHRINE HCL 10 MG/ML IJ SOLN
0.0000 ug/min | INTRAMUSCULAR | Status: DC
  Administered 2014-10-19: 30 ug/min via INTRAVENOUS
  Administered 2014-10-19: 60 ug/min via INTRAVENOUS
  Administered 2014-10-19: 40 ug/min via INTRAVENOUS
  Administered 2014-10-19: 50 ug/min via INTRAVENOUS
  Administered 2014-10-19: 60 ug/min via INTRAVENOUS
  Administered 2014-10-20: 50 ug/min via INTRAVENOUS
  Filled 2014-10-19 (×6): qty 1

## 2014-10-19 MED ORDER — PANTOPRAZOLE SODIUM 40 MG PO PACK
40.0000 mg | PACK | Freq: Every day | ORAL | Status: DC
  Administered 2014-10-19 – 2014-10-20 (×2): 40 mg
  Filled 2014-10-19 (×3): qty 20

## 2014-10-19 MED ORDER — IPRATROPIUM-ALBUTEROL 0.5-2.5 (3) MG/3ML IN SOLN: 3.0000 mL | RESPIRATORY_TRACT | Status: DC

## 2014-10-19 MED ORDER — VALPROATE SODIUM 500 MG/5ML IV SOLN
500.0000 mg | Freq: Once | INTRAVENOUS | Status: AC
  Administered 2014-10-19: 500 mg via INTRAVENOUS
  Filled 2014-10-19: qty 5

## 2014-10-19 MED ORDER — SODIUM CHLORIDE 0.9 % IJ SOLN: 3.0000 mL | INTRAMUSCULAR | Status: DC | PRN

## 2014-10-19 MED ORDER — SODIUM CHLORIDE 0.9 % IV SOLN: INTRAVENOUS | Status: DC

## 2014-10-19 MED ORDER — BACLOFEN 20 MG PO TABS
20.0000 mg | ORAL_TABLET | Freq: Three times a day (TID) | ORAL | Status: DC
  Administered 2014-10-19 – 2014-10-20 (×7): 20 mg
  Filled 2014-10-19 (×16): qty 1

## 2014-10-19 MED ORDER — SODIUM CHLORIDE 0.9 % IV BOLUS (SEPSIS)
2000.0000 mL | Freq: Once | INTRAVENOUS | Status: AC
Start: 2014-10-19 — End: 2014-10-19
  Administered 2014-10-19: 2000 mL via INTRAVENOUS

## 2014-10-19 MED ORDER — MORPHINE SULFATE (PF) 2 MG/ML IV SOLN
2.0000 mg | INTRAVENOUS | Status: DC | PRN
  Administered 2014-10-19 – 2014-10-20 (×5): 1 mg via INTRAVENOUS
  Administered 2014-10-20 (×2): 2 mg via INTRAVENOUS
  Administered 2014-10-20 (×3): 1 mg via INTRAVENOUS
  Administered 2014-10-20 – 2014-10-21 (×2): 2 mg via INTRAVENOUS
  Filled 2014-10-19 (×11): qty 1

## 2014-10-19 MED ORDER — KETOROLAC TROMETHAMINE 30 MG/ML IJ SOLN
30.0000 mg | Freq: Once | INTRAMUSCULAR | Status: AC
  Administered 2014-10-19: 30 mg via INTRAMUSCULAR

## 2014-10-19 MED ORDER — CLINDAMYCIN PHOSPHATE 600 MG/50ML IV SOLN
600.0000 mg | Freq: Two times a day (BID) | INTRAVENOUS | Status: DC
  Administered 2014-10-19: 600 mg via INTRAVENOUS
  Filled 2014-10-19 (×3): qty 50

## 2014-10-19 MED ORDER — CHLORHEXIDINE GLUCONATE 0.12 % MT SOLN
15.0000 mL | Freq: Two times a day (BID) | OROMUCOSAL | Status: DC
  Administered 2014-10-19 – 2014-10-22 (×7): 15 mL via OROMUCOSAL

## 2014-10-19 MED ORDER — GERHARDT'S BUTT CREAM
1.0000 "application " | TOPICAL_CREAM | Freq: Two times a day (BID) | CUTANEOUS | Status: DC | PRN
  Administered 2014-10-21: 1 via TOPICAL
  Filled 2014-10-19: qty 1

## 2014-10-19 MED ORDER — SODIUM CHLORIDE 0.9 % IV SOLN
250.0000 mL | INTRAVENOUS | Status: DC | PRN
  Administered 2014-10-21: 250 mL via INTRAVENOUS

## 2014-10-19 MED ORDER — DEXTROSE 5 % IV SOLN
500.0000 mg | INTRAVENOUS | Status: DC
  Filled 2014-10-19 (×2): qty 500

## 2014-10-19 MED ORDER — LORAZEPAM 2 MG/ML IJ SOLN
4.0000 mg | INTRAMUSCULAR | Status: DC | PRN
  Filled 2014-10-19: qty 2

## 2014-10-19 MED ORDER — MORPHINE SULFATE (PF) 2 MG/ML IV SOLN
INTRAVENOUS | Status: AC
  Filled 2014-10-19: qty 1

## 2014-10-19 NOTE — Care Management Note (Signed)
Case Management Note  Patient Details  Name: [name] MRN: [id number] Date of Birth: 1990-04-21  Subjective/Objective:    Admitted with CAP                Action/Plan: PTA pt lived at home- PCP-PARKER, CALEB M , NCM to follow for d/c needs      Expected Discharge Date:                  Expected Discharge Plan:  Home/Self Care  In-House Referral:     Discharge planning Services  CM Consult  Post Acute Care Choice:    Choice offered to:     DME Arranged:    DME Agency:     HH Arranged:    HH Agency:     Status of Service:  In process, will continue to follow  Medicare Important Message Given:    Date Medicare IM Given:    Medicare IM give by:    Date Additional Medicare IM Given:    Additional Medicare Important Message give by:     If discussed at Long Length of Stay Meetings, dates discussed:    Additional Comments:  Darrold Span, RN 10/19/2014, 10:32 AM

## 2014-10-19 NOTE — Progress Notes (Signed)
Called by RN for concern that patient had a staring seizure lasting a few minutes. Ordered ativan  IV. Patient received about 1 mg of ativan before IV access was lost. HR was up to the 160s after seizure.   Evaluated the patient and he did not exhibit any signs of seizure activity at present. Have ordered IM Ativan in case patient has another seizure prior to regaining IV access.   Have ordered Chest PT, Duoneb, and one time dose of Toradol IM 60 mg. Will continue to monitor HR and asked nursing to call with update s/p Chest PT.

## 2014-10-19 NOTE — Progress Notes (Signed)
Called per [address] [place] RN Tim  At [phone] for Pt with elevated HR, RR, and possible seizures. Family Med Resident Paged to bedside prior to my call for Pt HR and possible seizures, Pt had a staring seizure lasting a few minutes witnessed by RN. IV ativan not given due to loss of IV access. Upon my arrival to bedside at 0615 Pt found resting in bed with Chest PT going. RR 30s, Po2 92-94% on NRB, BP 121/81, HR 175. Rectal temp obtained yielding 102. Pt lungs with rhonchi throughout. Lactic Acid lab placed STAT. Family Med Resident paged and updated, to follow up on lactic acid results. Tylenol ordered and given, cooling measures to be started per RN. IV team at bedside attempting new PIV. RN to monitor closely, Rapid Response to follow.

## 2014-10-19 NOTE — Progress Notes (Signed)
Had gone up to bedside to re-evaluate. Had long d/w pt's mother who is at bedside. He was in significant respiratory distress. His CXR had looked a little better after his CVL placement. He had received his 30 ml/kg and his BP had improved. His mother felt strongly that he should not be intubated and as discussed earlier he is full DNR. We discussed treatment options w/ the plan to first try nasotracheal suctioning and escalating nebs, but if this failed we would try PRN morphine as a way to decrease his respiratory distress. She understood that this might lead to true palliative approach if not successful. He received the morphine IV which had significant improvement in his work of breathing but did result in some hypotension  Plan D/c serial lactic acids as morphine certainly contributing Titrate neo as needed for MAP > 65 Added PRN morphine  Will cont supportive care.  If starts needing escalated dosing I would consider adding morphine gtt.   Simonne Martinet ACNP-BC [facility] Pulmonary/Critical Care Pager # [phone] OR # [phone] if no answer

## 2014-10-19 NOTE — ED Notes (Signed)
Pt seizing. Seizure lasted about 1 minute. Tonic Clonic.

## 2014-10-19 NOTE — ED Notes (Signed)
Attempted to call report

## 2014-10-19 NOTE — Progress Notes (Addendum)
Initial Nutrition Assessment  DOCUMENTATION CODES:   Not applicable  INTERVENTION:    Continue Jevity 1.2 at 37 ml/h to provide 1066 kcals, 49 gm protein, 719 ml free water daily.  Free water flushes 60 ml every 2 hours for total enteral free water intake of 1439 ml daily.  Once IVF d/c'ed will need increased free water flushes, 90-100 ml every 2 hours.  NUTRITION DIAGNOSIS:   Inadequate oral intake related to inability to eat as evidenced by NPO status.  GOAL:   Patient will meet greater than or equal to 90% of their needs  MONITOR:   TF tolerance, Weight trends, Labs, Skin  REASON FOR ASSESSMENT:   Low [place] and Home TF  ASSESSMENT:   24 yom w/ sig h/o CP. 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 Admitted on 8/24 w/ working dx of PNA. PCCM asked to see on 8/25 for progressive hypoxic respiratory failure and worsening distress.   Mom reports that Brekken receives the following TF regimen at home via J-tube: Jevity 1.2 (3.5 cans plus 1/2-1 can of water at 37 ml/h) He also receives a bag of fluid via the pump, she thinks it is 30 ml every 1-2 hours. The tube is flushed with 60 ml of water every 2 hours while they are awake (9 times per day). Mom requests that he receive water flushes every 2 hours while in the hospital to prevent the tube from clogging. He has had multiple instances of the tube being clogged and having to be replaced, which she states is a big ordeal.   Mom reports that Agustus has been tolerating the above TF regimen well and his weight has been stable.  Labs reviewed: sodium, chloride, BUN, creatinine are low.   Currently receiving IVF at 75-100 ml/h; once IVF d/c'ed, will need more free water flushes.   Diet Order:  Diet NPO time specified Except for: Other (See Comments)  Skin:  Wound (see comment) (stage 1 pressure ulcer to buttocks)  Last BM:  8/24  Height:   Ht Readings from Last 1 Encounters:  10/19/14  (1.727 m)     Weight:   Wt Readings from Last 1 Encounters:  10/19/14 94 lb 5.7 oz (42.8 kg)    Ideal Body Weight:   N/A  BMI:  Body mass index is 14.35 kg/(m^2).  Estimated Nutritional Needs:   Kcal:  1000-1200  Protein:  45 gm  Fluid:  1.5 L  EDUCATION NEEDS:   No education needs identified at this time  Joaquin Courts, RD, LDN, CNSC Pager [phone] After Hours Pager [phone]

## 2014-10-19 NOTE — Progress Notes (Signed)
Initially notified of patient's tachycardia around 6am. Ativan was attempted to be given for seizure, unfortunately IV access lost.   Around 6:15am, patient satting 96-98% on NRB, however noted to have more rhonchi on my exam. HR in the 140s at the time. Patient moaning like he was in pain, however not worsened by abdominal exam. RN actively attempting to obtain IV access. Stated they would call IV team if unable to obtain access.  Advised to give Toradol  IM to assist with pain as this could be the etiology of his tachycardia. Respiratory to give Duoneb and chest PT.   Blood cultures were ordered, unsure if they will be able to obtain and would be after antibiotics antibiotics.   Paged around 6:50am: Unfortunately IV team was unable to get access. Now with a fever of 102 (previously afebirle).  Continued to be tachycardic in the 160s, then had another 1 minute seizure that resolved spontaneously. HR at that time increased to 170s and O2 sats dropped to 86%. STAT EKG ordered. Blood cultures still not able to be obtained.   Contacted CCM, Dr. Tyson Alias, concerning decline in respiratory status, IV access, and transfer to ICU. Dr. Tyson Alias to see the patient. Requested we obtain stat ABGs and STAT portable CXR.   Also contacted neurology concerning seizure activity, as Ativan will only suppress the patient's respiratory drive more.   Mother not at the bedside but updated and on her way to the hospital.  Appreciate everyone's assistance in caring for this patient.  Joanna Puff, MD [facility] Family Medicine Resident  10/19/2014, 8:37 AM

## 2014-10-19 NOTE — Consult Note (Signed)
PULMONARY / CRITICAL CARE MEDICINE   Name: [name]. [name] MRN: [id number] DOB: 1990/05/11    ADMISSION DATE:  10/22/2014 CONSULTATION DATE:  8/25  REFERRING MD :  Pollie Meyer   CHIEF COMPLAINT:   Acute hypoxic respiratory failure   INITIAL PRESENTATION:   24 yom w/ sig h/o CP. 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 Admitted on 8/24 w/ working dx of PNA. PCCM asked to see on 8/25 for progressive hypoxic respiratory failure and worsening distress.   STUDIES:    SIGNIFICANT EVENTS:    HISTORY OF PRESENT ILLNESS:   [name] is a 24 y.o. male presented 8/24 with worsening cough. Mother stated it is a dry, mostly non-productive cough. Over the weekend he was noted to be moaning when he was placed on his left side and it was difficult to get him comfortable. He did not respond to SABA, home O2 as low as 80s but started to increase to the 90s at that time. Per the mother his symptoms rapidly continued to worsen. No hemoptysis or emesis. No runny nose. He had a low grade fever between 99-100F. He's on 2L Frank at night. At baseline he's nonverbal, no use of his hands, is wheelchair bound. He is cortically blind. He's on continuous feeds with Jevity at 87ml/hr however his mother stopped this when she got home today. No excess secretions noted, he's still on Rubinol. He does not take anything orally. Mom does not that he occasionally gets build up of "gunk" on the roof of his mouth that needs cleaning.  He gets chest PT with a vest TID at home. He has been hospitalized in the past for similar symptoms/PNA. The patient has never been intubated. 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 Last grand mal seizure has been in the last week. The mother, father, son, and babysitter all take care of the patient. He was admitted w/ working dx of PNA. He became progressively more hypoxic w/ escalating O2 requirements. His sats were 87% on 100% NRB. He became  progressively hypoxic w/ complete left sided hemithorax opacification. Had witnessed seizure and was then moved to the ICU for progressive respiratory distress, tachycardia and shock. PCCM was asked to assume care.    PAST MEDICAL HISTORY :   has a past medical history of Allergy; Asthma; Cerebral palsy, quadriplegic; Congenital CMV infection; Neuromuscular disorder; H/O hiatal hernia; Pneumonia; Community acquired pneumonia (06/30/2011); GERD (gastroesophageal reflux disease); Seizures; Cytomegalovirus; On home oxygen therapy; Jejunostomy tube present; and Acute and chronic respiratory failure (12/24/2011).  has past surgical history that includes Nissen fundoplication; rod (~ 2003 "or before"); PEG tube placement; percutaneous gastrotomy tube replacement; and Circumcision. Prior to Admission medications   Medication Sig Start Date End Date Taking? Authorizing Provider  albuterol (PROVENTIL) (2.5 MG/3ML) 0.083% nebulizer solution Take 3 mLs (2.5 mg total) by nebulization every 6 (six) hours as needed for wheezing or shortness of breath. 12/12/13  Yes Glori Luis, MD  baclofen (LIORESAL) 10 MG tablet CRUSH 2 TABLETS AND TAKE BY GASTROSTOMY TUBE 3 TIMES DAILY 06/06/14  Yes Deetta Perla, MD  DEPAKOTE SPRINKLES 125 MG capsule TAKE 3 CAPSULES EVERY MORNING, TAKE 2 CAPSULES MIDDAY, AND TAKE 3 CAPSULES AT BEDTIME 06/06/14  Yes Deetta Perla, MD  diazepam (DIASAT) 20 MG GEL USE 20 MG RECTALLY AFTER 2 MINUTES FOR CONTINUOUS GENERALIZED SEIZURE 07/11/14  Yes Elveria Rising, NP  Feeding Tubes - Pump (KANGAROO JOEY ENTERAL PUMP) MISC 30 Units by  Does not apply route daily. Please provide bags for Kangroo Joey enteral feeding device. Should be changed daily. 10/20/11  Yes Shelly Rubenstein, MD  gabapentin (NEURONTIN) 300 MG capsule TAKE ONE CAPSULE PER TUBE 3 TIMES A DAY 07/28/14  Yes Elveria Rising, NP  glycopyrrolate (ROBINUL) 2 MG tablet TAKE 1 TABLET BY MOUTH PER G-TUBE TWICE A DAY 06/06/14  Yes  Deetta Perla, MD  Hydrocortisone (GERHARDT'S BUTT CREAM) CREA Apply 1 application topically 2 (two) times daily as needed for irritation. Please provide remained of tube used in hospital 07/08/11  Yes Andrena Mews, MD  Nutritional Supplements (FEEDING SUPPLEMENT, JEVITY 1.2 CAL,) LIQD Place 1,000 mLs into feeding tube continuous. 05/07/12  Yes Glori Luis, MD  nystatin cream (MYCOSTATIN) Apply topically 2 (two) times daily as needed (Redness). 01/05/12  Yes Leona Singleton, MD  omeprazole (PRILOSEC) 2 mg/mL SUSP Take 10 mLs (20 mg total) by mouth daily at 12 noon. Patient taking differently: Place 20 mg into feeding tube daily at 12 noon.  11/14/13  Yes Ardith Dark, MD  azithromycin (ZITHROMAX) 250 MG tablet 1 tablet daily. 04/25/14   Kathee Delton, MD  clindamycin (CLEOCIN) 300 MG capsule Take 1 capsule (300 mg total) by mouth every 6 (six) hours. 04/25/14   Kathee Delton, MD  Incontinence Supplies MISC May have incontinence supplies as determined by home health agency. 02/11/12   Shelly Rubenstein, MD  Oral Hygiene Products (OROPHARYNGEAL SUCTION CATHETER) MISC Use as directed 1 Device in the mouth or throat daily as needed. 12/15/13   Glori Luis, MD   Allergies  Allergen Reactions  . Sulfamethoxazole-Trimethoprim Rash  . Topamax [Topiramate] Other (See Comments)    FAMILY HISTORY:  indicated that his mother is alive. He indicated that his father is alive. He indicated that his sister is alive. He indicated that both of his brothers are alive. He indicated that his maternal grandmother is alive. He indicated that his maternal grandfather is alive. He indicated that his paternal grandmother is deceased. He indicated that his paternal grandfather is alive.  SOCIAL HISTORY:  reports that he has never smoked. He has never used smokeless tobacco. He reports that he does not drink alcohol or use illicit drugs.  REVIEW OF SYSTEMS:  Unable   SUBJECTIVE:  Acutely ill  VITAL  SIGNS: Temp:  [97.6 F (36.4 C)-102.3 F (39.1 C)] 102.3 F (39.1 C) (08/25 0615) Pulse Rate:  [91-175] 159 (08/25 0700) Resp:  [10-38] 34 (08/25 0700) BP: (89-124)/(49-90) 105/68 mmHg (08/25 0700) SpO2:  [87 %-100 %] 90 % (08/25 0700) FiO2 (%):  [28 %] 28 % (08/25 0044) Weight:  [42.8 kg (94 lb 5.7 oz)] 42.8 kg (94 lb 5.7 oz) (08/25 0205) HEMODYNAMICS:   VENTILATOR SETTINGS: Vent Mode:  [-]  FiO2 (%):  [28 %] 28 % INTAKE / OUTPUT:  Intake/Output Summary (Last 24 hours) at 10/19/14 1610 Last data filed at 10/19/14 0300  Gross per 24 hour  Intake     30 ml  Output      1 ml  Net     29 ml    PHYSICAL EXAMINATION: General:  Acutely ill  Neuro:  Minimally responsive. Contracted, non-verbal  HEENT:  Mm dry. No JVD.  Cardiovascular:  Tachy rrr  Lungs: tachy rrr  Abdomen:  Soft, PEG unremarkable no OM  Musculoskeletal:  Contracted  Skin:  Intact   LABS:  CBC  Recent Labs Lab October 29, 2014 1810 10/19/14 0340  WBC 8.7 10.1  HGB 15.8 15.8  HCT 45.4 45.8  PLT 120* 146*   Coag's No results for input(s): APTT, INR in the last 168 hours. BMET  Recent Labs Lab 09/28/2014 1810 10/19/14 0340  NA 132*  --   K 4.0  --   CL 95*  --   CO2 29  --   BUN <5*  --   CREATININE <0.30* <0.30*  GLUCOSE 87  --    Electrolytes  Recent Labs Lab 09/28/2014 1810  CALCIUM 9.3   Sepsis Markers No results for input(s): LATICACIDVEN, PROCALCITON, O2SATVEN in the last 168 hours. ABG No results for input(s): PHART, PCO2ART, PO2ART in the last 168 hours. Liver Enzymes  Recent Labs Lab 10/11/2014 1810  AST 22  ALT 13*  ALKPHOS 73  BILITOT 1.0  ALBUMIN 3.3*   Cardiac Enzymes No results for input(s): TROPONINI, PROBNP in the last 168 hours. Glucose No results for input(s): GLUCAP in the last 168 hours.  Imaging Dg Chest 2 View  10/13/2014   CLINICAL DATA:  Cough for 2 days. Fever today. History of pneumonia. History of cerebral palsy, quadriplegic, congenital CMV infection,  hiatal hernia.  EXAM: CHEST  2 VIEW  COMPARISON:  04/22/2014  FINDINGS: There is consolidation and volume loss in the left lung base behind the heart consistent with left lower lobe collapse. This was present on the previous study suggesting a possible chronic versus recurrent process. Right lung is clear. No blunting of costophrenic angles. No pneumothorax. Normal heart size and pulmonary vascularity. Postoperative changes with posterior fixation of the thoracic spine. Enteric tube partially visualized in the left upper quadrant.  IMPRESSION: Consolidation and volume loss in the left lower lung. This was present on the previous study, suggesting a possible chronic versus recurrent process.   Electronically Signed   By: Burman Nieves M.D.   On: 09/29/2014 18:27     ASSESSMENT / PLAN:  PULMONARY OETT: DNI A: Acute hypoxic respiratory failure CAP vs aspiration  Left hemithorax atelectasis and complete opacification in setting of mucous plugging  P:   Titrate FIO2, consider high flow O2 Scheduled nebs Place nasal trumpet Suction PRN Not candidate for BIPAP DNI  CARDIOVASCULAR CVL A:  SIRS/sepsis  SVT P:  Maximize volume status.  Ck CVP  Cont IVFs  RENAL A:  At risk for AKI  P:   Avoid hypotension  Renal dose meds   GASTROINTESTINAL A: Protein cal malnutrition  PEG status  P:   Aspiration precautions  PEG feeds   HEMATOLOGIC A:   Mild thrombocytopenia   P:  Trend cbc Cont LMWH  INFECTIOUS A:   Aspiration vs CAP w/ progressive radiographic worsening  P:   BCx2 8/24>>> u strep 8/24>>> u legionella 8/24>>> Rocephin 8/24>>8/25 clinda 8/24>>> vanc 8/24>>> ceftaz 8/25>>> azithro 8/24>>>  ENDOCRINE A:   No acute    P:   Trend am fasting glucose   NEUROLOGIC A:  Acute encephalopathy  Seizures Post-ictal state Cerebral palsy  P:   RASS goal: -1 Neurology consulted to a/w AEDs Avoid sedating meds Seizure precautions   FAMILY  - Updates: goals  d/w mother. He is now full DNR.   - Inter-disciplinary family meet or Palliative Care meeting due by 9/1    TODAY'S SUMMARY:   24 yom w/ sig h/o CP. 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 Admitted on 8/24 w/ working dx of PNA. PCCM asked to see on 8/25 for progressive hypoxic respiratory failure and  worsening distress. Have moved him to the ICU. Will cont 100% NRB, did not tol high flow nasal. Have placed central line d/t no access. Widen ABX, fluid challenge, rx seizure and cont supportive care. We may need to transition to comfort.  Simonne Martinet ACNP-BC [facility] Pulmonary/Critical Care Pager # [phone] OR # [phone] if no answer   10/19/2014, 8:21 AM   STAFF NOTE: I, Rory Percy, MD FACP have personally reviewed patient's available data, including medical history, events of note, physical examination and test results as part of my evaluation. I have discussed with resident/NP and other care providers such as pharmacist, RN and RRT. In addition, I personally evaluated patient and elicited key findings of: Evaluated in SDU and in icu upon transfer, severe distress, paradoxical resp, hypoxia, upper airway clear, moves air to bases with fast RR, no overt myoclonus / seizures noted, I have had extensive discussions with family mom. We discussed patients current circumstances and organ failures. We also discussed patient's prior wishes under circumstances such as this. Family has decided to NOT perform resuscitation if arrest but to continue current medical support for now. Pressors OK, line OK, NO ETT, no form acls, no cpr, impression is worsening Left PNA, at risk aspiration, seizures complicating, repeat pcxr worsening infiltrate making csf as source unlikely, change azithro to levo, change cefta , dc ceftriaxone,a dd vanc, not a candidate BIPAp, too high risk aspiration may harm, mom agrees, place line, as have NO access, cvp, bolus and have pos balance, may need  to provide full comfort care, mom does not want heroics aggressive care, fluid resuscitate, no role LActic, mom does not want heroics, may expire and seizure will be elevated   The patient is critically ill with multiple organ systems failure and requires high complexity decision making for assessment and support, frequent evaluation and titration of therapies, application of advanced monitoring technologies and extensive interpretation of multiple databases.   Critical Care Time devoted to patient care services described in this note is 45 Minutes. This time reflects time of care of this signee: Rory Percy, MD FACP. This critical care time does not reflect procedure time, or teaching time or supervisory time of PA/NP/Med student/Med Resident etc but could involve care discussion time. Rest per NP/medical resident whose note is outlined above and that I agree with   Mcarthur Rossetti. Tyson Alias, MD, FACP Pgr: [phone] [facility] 10/19/2014 10:37 AM

## 2014-10-19 NOTE — Progress Notes (Signed)
ANTIBIOTIC CONSULT NOTE - INITIAL  Pharmacy Consult for Vancomycin Indication: pneumonia  Allergies  Allergen Reactions  . Sulfamethoxazole-Trimethoprim Rash  . Topamax [Topiramate] Other (See Comments)    Patient Measurements: Height: 5\' 8"  (172.7 cm) Weight: 94 lb 5.7 oz (42.8 kg) IBW/kg (Calculated) : 68.4  Vital Signs: Temp: 98 F (36.7 C) (08/25 1124) Temp Source: Oral (08/25 1124) BP: 101/83 mmHg (08/25 1300) Pulse Rate: 128 (08/25 1300) Intake/Output from previous day: 08/24 0701 - 08/25 0700 In: 30  Out: 1 [Urine:1] Intake/Output from this shift: Total I/O In: 3390 [I.V.:3190; IV Piggyback:200] Out: 2650 [Urine:2650]  Labs:  Recent Labs  Oct 20, 2014 1810 10/19/14 0340 10/19/14 0843  WBC 8.7 10.1  --   HGB 15.8 15.8  --   PLT 120* 146*  --   CREATININE <0.30* <0.30* 0.32*   Estimated Creatinine Clearance: 85.5 mL/min (by C-G formula based on Cr of 0.32). No results for input(s): VANCOTROUGH, VANCOPEAK, VANCORANDOM, GENTTROUGH, GENTPEAK, GENTRANDOM, TOBRATROUGH, TOBRAPEAK, TOBRARND, AMIKACINPEAK, AMIKACINTROU, AMIKACIN in the last 72 hours.   Microbiology: No results found for this or any previous visit (from the past 720 hour(s)).  Medical History: Past Medical History  Diagnosis Date  . Allergy   . Asthma   . Cerebral palsy, quadriplegic   . Congenital CMV infection   . Neuromuscular disorder     cp  . H/O hiatal hernia   . Pneumonia     "he's had it several times; maybe now" (12/17/2011)  . Community acquired pneumonia 06/30/2011    Psuedomonas PNA 06/2011 >tx 21 d of Cipro  CXR 08/15/11 >clearance of PNA    . GERD (gastroesophageal reflux disease)   . Seizures     "has had gran mals before" (12/17/2011)  . Cytomegalovirus   . On home oxygen therapy     "at night" (12/17/2011"  . Jejunostomy tube present   . Acute and chronic respiratory failure 12/24/2011    Medications:  Prescriptions prior to admission  Medication Sig Dispense Refill  Last Dose  . albuterol (PROVENTIL) (2.5 MG/3ML) 0.083% nebulizer solution Take 3 mLs (2.5 mg total) by nebulization every 6 (six) hours as needed for wheezing or shortness of breath. 150 mL 1 10/17/2014 at Unknown time  . baclofen (LIORESAL) 10 MG tablet CRUSH 2 TABLETS AND TAKE BY GASTROSTOMY TUBE 3 TIMES DAILY 186 tablet 5 2014-10-20 at Unknown time  . DEPAKOTE SPRINKLES 125 MG capsule TAKE 3 CAPSULES EVERY MORNING, TAKE 2 CAPSULES MIDDAY, AND TAKE 3 CAPSULES AT BEDTIME 250 capsule 5 20-Oct-2014 at Unknown time  . diazepam (DIASAT) 20 MG GEL USE 20 MG RECTALLY AFTER 2 MINUTES FOR CONTINUOUS GENERALIZED SEIZURE 2 Package 5 prn  . Feeding Tubes - Pump (KANGAROO JOEY ENTERAL PUMP) MISC 30 Units by Does not apply route daily. Please provide bags for Kangroo Joey enteral feeding device. Should be changed daily. 30 each 12 10/20/14 at Unknown time  . gabapentin (NEURONTIN) 300 MG capsule TAKE ONE CAPSULE PER TUBE 3 TIMES A DAY 90 capsule 3 October 20, 2014 at Unknown time  . glycopyrrolate (ROBINUL) 2 MG tablet TAKE 1 TABLET BY MOUTH PER G-TUBE TWICE A DAY 62 tablet 5 2014/10/20 at Unknown time  . Hydrocortisone (GERHARDT'S BUTT CREAM) CREA Apply 1 application topically 2 (two) times daily as needed for irritation. Please provide remained of tube used in hospital   20-Oct-2014 at Unknown time  . Nutritional Supplements (FEEDING SUPPLEMENT, JEVITY 1.2 CAL,) LIQD Place 1,000 mLs into feeding tube continuous.   October 20, 2014 at Unknown time  .  nystatin cream (MYCOSTATIN) Apply topically 2 (two) times daily as needed (Redness). 30 g 1 10/22/2014 at Unknown time  . omeprazole (PRILOSEC) 2 mg/mL SUSP Take 10 mLs (20 mg total) by mouth daily at 12 noon. (Patient taking differently: Place 20 mg into feeding tube daily at 12 noon. ) 150 mL 11 10/12/2014 at Unknown time  . azithromycin (ZITHROMAX) 250 MG tablet 1 tablet daily. 3 tablet 0 Taking  . clindamycin (CLEOCIN) 300 MG capsule Take 1 capsule (300 mg total) by mouth every 6 (six)  hours. 20 capsule 0 Taking  . Incontinence Supplies MISC May have incontinence supplies as determined by home health agency. 30 each 0 Taking  . Oral Hygiene Products (OROPHARYNGEAL SUCTION CATHETER) MISC Use as directed 1 Device in the mouth or throat daily as needed. 3 each 0 Taking   Assessment: 24 yo M wheelchair bound at baseline d/t congenital CMV with a history of recurrent PNA. Originally admitted to the [facility] Medicine service but began to decompensate with hypoxia, tachycardia, fever, seizure activity, and lost IV access. He was transferred to the ICU with antibiotics broadened to cover for HCAP.  Tmax 102.3, LA 1.4, WBC 10.1  8/25 Ceftazidime > 8/25 Levofloxacin > 8/25 Vancomycin > 8/24 azithromycin > 8/25 8/24 Rocephin 1g x1 8/25 clindamycin 600 mg x1  8/25 MRSA PCR 8/25 UCx 8/25 BCx x2 8/25 Sputum Cx Strep urinary antigen Negative  Goal of Therapy:  Vancomycin trough level 15-20 mcg/ml  Plan:  Vancomycin 1000 mg IV x1, then 500 mg IV q8h Trough at Css F/U C&S, clinical improvement, LOT  Arcola Jansky, PharmD Clinical Pharmacy Resident Pager: [phone] 10/19/2014,1:15 PM

## 2014-10-19 NOTE — Clinical Documentation Improvement (Signed)
Critical Care  Please assess Nutritional Consult and render an opinion in next progress note and include in discharge summary if applicable.   Document if pressure ulcer with stage is Present on Admission   Document Site with laterality - Elbow, Back (upper/lower), Sacral, Hip, Buttock, Ankle, Heel, Head, Other (Specify)  Pressure Ulcer Stage - Stage1, Stage 2, Stage 3, Stage 4, Unstageable, Unspecified, Unable to Clinically Determine  Other  Supporting Information:  Nutritional Consult notes Stage 1 pressure Ulcer to buttock  Please exercise your independent, professional judgment when responding. A specific answer is not anticipated or expected.  Thank You,  Shellee Milo Health Information Management [place]

## 2014-10-19 NOTE — Progress Notes (Signed)
PT HR 160' 170's sinus Pt exhibiting seizure like activity md paged IV ativan ordered, when giving ativan IV infiltrated, IM ativan ordered PT temp 102.3 rectally prn tylenol given, 30 mg IM Toradol given  for pain  Chest pt done via respatory. Pt placed on 100 percent. NON rebreather saturations 96 %. Rapid response at the bedside. MD paged  Again orders received. Mother notified  Coming now will continue to monitor.

## 2014-10-19 NOTE — Consult Note (Signed)
Neurology Consultation Reason for Consult: Seizures Referring Physician: Pollie Meyer, B  CC: Seizures  History is obtained from: Mother  HPI: [name]. [name] is a 24 y.o. 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 He has seizures at baseline, multiple per day per mother. He will have GTC about weekly, however. Mother reports that after bringing him in due to pneumonia, he received his meds late last night and had a seizure much worse than typical.   This morning, he had recurrent seizures, though these sound much more like his typical seizures that he often has on awakening per mother.    ROS:  Unable to obtain due to altered mental status.   Past Medical History  Diagnosis Date  . Allergy   . Asthma   . Cerebral palsy, quadriplegic   . Congenital CMV infection   . Neuromuscular disorder     cp  . H/O hiatal hernia   . Pneumonia     "he's had it several times; maybe now" (12/17/2011)  . Community acquired pneumonia 06/30/2011    Psuedomonas PNA 06/2011 >tx 21 d of Cipro  CXR 08/15/11 >clearance of PNA    . GERD (gastroesophageal reflux disease)   . Seizures     "has had gran mals before" (12/17/2011)  . Cytomegalovirus   . On home oxygen therapy     "at night" (12/17/2011"  . Jejunostomy tube present   . Acute and chronic respiratory failure 12/24/2011     Family History  Problem Relation Age of Onset  . Allergies Mother   . Heart disease Maternal Uncle   . Colon cancer Paternal Grandmother     Died at 73  . Diabetes Father      Social History: lives at home  Exam: Current vital signs: BP 105/68 mmHg  Pulse 159  Temp(Src) 102.3 F (39.1 C) (Axillary)  Resp 34  Ht  (1.727 m)  Wt 42.8 kg (94 lb 5.7 oz)  BMI 14.35 kg/m2  SpO2 90% Vital signs in last 24 hours: Temp:  [97.6 F (36.4 C)-102.3 F (39.1 C)] 102.3 F (39.1 C) (08/25 0615) Pulse Rate:  [91-175] 159 (08/25 0700) Resp:  [10-38] 34 (08/25  0700) BP: (89-124)/(49-90) 105/68 mmHg (08/25 0700) SpO2:  [87 %-100 %] 90 % (08/25 0700) FiO2 (%):  [28 %] 28 % (08/25 0044) Weight:  [42.8 kg (94 lb 5.7 oz)] 42.8 kg (94 lb 5.7 oz) (08/25 0205)   Physical Exam  Constitutional: Appears chronically cp Psych: does not respond Eyes: No scleral injection HENT: No OP obstrucion Head: slightly small Cardiovascular: tachycardic Respiratory: NRB in place, breathing rapidly.  GI: Soft.   Skin: WDI  Neuro: Mental Status: Patient does not follow commands, does open eyes to mild stimulation.  Cranial Nerves: II: Does not blink to threat Pupils are equal, round, and reactive to light.   III,IV, VI: eyes slightly dysconjugate V: VII: blnks to eyelid stimulation bilaterally VIII, X, XI, XII: Unable to assess secondary to patient's altered mental status.  Motor: Tone is increased. He has chronic contractures. Flexes all extremities to noxious sitmulation.  Sensory: As above Cerebellar: Unable to assess secondary to patient's altered mental status.    I have reviewed labs in epic and the results pertinent to this consultation are: Mild hyponatremia  I have reviewed the images obtained:CT maxillofacial 2009 - ventriculometry with thin cortex  Impression:  24 yo M with CP, MR who presents with pneumonia and  has had more severe than typical seizures. Any infection can lower seizure threshold, but also received some of his meds late yesterday.   Recommendations: 1) Depakote at home dose of 375 - 250-375 2) gabapentin 300mg  TID 3) will continue to follow. Would be hesitant to give ativan given respiratory status unless in clinical status epilepticus.   Ritta Slot, MD [facility] [phone]  If 7pm- 7am, please page neurology on call as listed in AMION.

## 2014-10-19 NOTE — Clinical Documentation Improvement (Signed)
Critical Care  Can the diagnosis of Shock be further specified?   Shock, including Type:  Septic, Cardiogenic, Hyper/Hypoglycemic, Hypovolemic, Hemorrhagic, Neurogenic, Anaphylactic, Other type, including suspected or known cause and/or associated condition(s)  Other  Clinically Undetermined  Document any associated diagnoses/conditions.  Supporting Information:  Shock documented in 8/25 progress note  Patient now with multi organ failure  Please exercise your independent, professional judgment when responding. A specific answer is not anticipated or expected.  Thank You,  Shellee Milo Health Information Management [place] [phone]

## 2014-10-19 NOTE — Progress Notes (Signed)
Page Md pt saturations 87 percent on 100% nonrebreather HR still 150's 160's and will transfer to the ICU mother is on the way.

## 2014-10-19 NOTE — Progress Notes (Signed)
RT called to room around 2050 due to pt SpO2 decreasing. Pt currently on 100% NRB and 8LN/C. Attempted several times to NTS with size 12 and 10 catheter with no success. At this time pt SpO2 remained in low 80s. Placed on HFNC 100% with 30L, in attempt to increase SpO2 and moisten nasal passageways. RT attempted to suction nose with pediatric suction. Received small, tan, thick secretions. RT increased flow to 50L due to SpO2 still in 80s. Attempted to use size 6 and 8 peds catheter with little success. Received small, tan, thick secretions. Pt took about 10 minutes to respond to treatment. SpO2 currently 91% on HFNC100%, and 50L with 100% NRB. RN at bedside. MD aware.

## 2014-10-19 NOTE — Procedures (Signed)
Central Venous Catheter Insertion Procedure Note Leaman B. Knightly [id number] 06-18-1990  Procedure: Insertion of Central Venous Catheter Indications: Assessment of intravascular volume, Drug and/or fluid administration and Frequent blood sampling  Procedure Details Consent: Risks of procedure as well as the alternatives and risks of each were explained to the (patient/caregiver).  Consent for procedure obtained. Time Out: Verified patient identification, verified procedure, site/side was marked, verified correct patient position, special equipment/implants available, medications/allergies/relevent history reviewed, required imaging and test results available.  Performed  Maximum sterile technique was used including antiseptics, cap, gloves, gown, hand hygiene, mask and sheet. Skin prep: Chlorhexidine; local anesthetic administered A antimicrobial bonded/coated triple lumen catheter was placed in the left internal jugular vein using the Seldinger technique.  Evaluation Blood flow good Complications: No apparent complications Patient did tolerate procedure well. Chest X-ray ordered to verify placement.  CXR: pending.  Nelda Bucks 10/19/2014, 8:56 AM  No access momconsent [place]   Mcarthur Rossetti. Tyson Alias, MD, FACP Pgr: [phone] [facility]

## 2014-10-19 NOTE — Progress Notes (Signed)
Patient's mother feels like starting patient's tube-feeding at this point is not in the patient's best interest, and asks that they be held for the night.  She is worried about possible aspiration even though it is in the jejunum.  Will re-evaluate tomorrow.  Will monitor patient for signs of hypoglycemia and will check CBGs if needed.  This note has was approved verbally by patient's mother in the room.

## 2014-10-19 NOTE — Progress Notes (Signed)
Dear Doctor: Pollie Meyer This patient has been identified as a candidate for PICC for the following reason (s): poor veins/poor circulatory system (CHF, COPD, emphysema, diabetes, steroid use, IV drug abuse, etc.) and restarts due to phlebitis and infiltration in 24 hours If you agree, please write an order for the indicated device. For any questions contact the Vascular Access Team at [phone] if no answer, please leave a message.  Thank you for supporting the early vascular access assessment program.

## 2014-10-19 NOTE — Progress Notes (Signed)
Utilization review completed.  

## 2014-10-20 ENCOUNTER — Encounter (HOSPITAL_COMMUNITY): Payer: Self-pay | Admitting: *Deleted

## 2014-10-20 ENCOUNTER — Inpatient Hospital Stay (HOSPITAL_COMMUNITY): Payer: BC Managed Care – PPO

## 2014-10-20 DIAGNOSIS — R6521 Severe sepsis with septic shock: Secondary | ICD-10-CM

## 2014-10-20 DIAGNOSIS — A419 Sepsis, unspecified organism: Secondary | ICD-10-CM

## 2014-10-20 DIAGNOSIS — J9600 Acute respiratory failure, unspecified whether with hypoxia or hypercapnia: Secondary | ICD-10-CM | POA: Insufficient documentation

## 2014-10-20 DIAGNOSIS — J9601 Acute respiratory failure with hypoxia: Secondary | ICD-10-CM

## 2014-10-20 LAB — URINE CULTURE: Culture: NO GROWTH

## 2014-10-20 LAB — LEGIONELLA ANTIGEN, URINE

## 2014-10-20 LAB — CBC
HCT: 36.4 % — ABNORMAL LOW (ref 39.0–52.0)
HEMATOCRIT: 32.1 % — AB (ref 39.0–52.0)
HEMOGLOBIN: 11.1 g/dL — AB (ref 13.0–17.0)
Hemoglobin: 12.4 g/dL — ABNORMAL LOW (ref 13.0–17.0)
MCH: 31.8 pg (ref 26.0–34.0)
MCH: 31.7 pg (ref 26.0–34.0)
MCHC: 34.6 g/dL (ref 30.0–36.0)
MCHC: 34.1 g/dL (ref 30.0–36.0)
MCV: 92 fL (ref 78.0–100.0)
MCV: 93.1 fL (ref 78.0–100.0)
Platelets: 64 10*3/uL — ABNORMAL LOW (ref 150–400)
Platelets: 65 K/uL — ABNORMAL LOW (ref 150–400)
RBC: 3.49 MIL/uL — AB (ref 4.22–5.81)
RBC: 3.91 MIL/uL — ABNORMAL LOW (ref 4.22–5.81)
RDW: 13.8 % (ref 11.5–15.5)
RDW: 13.9 % (ref 11.5–15.5)
WBC: 9.5 10*3/uL (ref 4.0–10.5)
WBC: 11.7 K/uL — ABNORMAL HIGH (ref 4.0–10.5)

## 2014-10-20 LAB — BASIC METABOLIC PANEL
ANION GAP: 4 — AB (ref 5–15)
BUN: <5 mg/dL — ABNORMAL LOW (ref 6–20)
CHLORIDE: 101 mmol/L (ref 101–111)
CO2: 31 mmol/L (ref 22–32)
Calcium: 8 mg/dL — ABNORMAL LOW (ref 8.9–10.3)
Creatinine, Ser: <0.3 mg/dL — ABNORMAL LOW (ref 0.61–1.24)
Glucose, Bld: 92 mg/dL (ref 65–99)
POTASSIUM: 2.5 mmol/L — AB (ref 3.5–5.1)
SODIUM: 136 mmol/L (ref 135–145)

## 2014-10-20 LAB — BASIC METABOLIC PANEL WITH GFR
Anion gap: 5 (ref 5–15)
BUN: <5 mg/dL — ABNORMAL LOW (ref 6–20)
CO2: 30 mmol/L (ref 22–32)
Calcium: 8.4 mg/dL — ABNORMAL LOW (ref 8.9–10.3)
Chloride: 103 mmol/L (ref 101–111)
Creatinine, Ser: <0.3 mg/dL — ABNORMAL LOW (ref 0.61–1.24)
Glucose, Bld: 116 mg/dL — ABNORMAL HIGH (ref 65–99)
Potassium: 2.6 mmol/L — CL (ref 3.5–5.1)
Sodium: 138 mmol/L (ref 135–145)

## 2014-10-20 LAB — PHOSPHORUS: PHOSPHORUS: 2.3 mg/dL — AB (ref 2.5–4.6)

## 2014-10-20 LAB — LACTIC ACID, PLASMA
LACTIC ACID, VENOUS: 1 mmol/L (ref 0.5–2.0)
Lactic Acid, Venous: 1 mmol/L (ref 0.5–2.0)
Lactic Acid, Venous: 1.3 mmol/L (ref 0.5–2.0)
Lactic Acid, Venous: 0.8 mmol/L (ref 0.5–2.0)

## 2014-10-20 LAB — MAGNESIUM: Magnesium: 1.3 mg/dL — ABNORMAL LOW (ref 1.7–2.4)

## 2014-10-20 LAB — GLUCOSE, CAPILLARY: GLUCOSE-CAPILLARY: 89 mg/dL (ref 65–99)

## 2014-10-20 MED ORDER — POTASSIUM CHLORIDE 10 MEQ/50ML IV SOLN
10.0000 meq | INTRAVENOUS | Status: AC
  Administered 2014-10-20 (×6): 10 meq via INTRAVENOUS
  Filled 2014-10-20 (×6): qty 50

## 2014-10-20 MED ORDER — MAGNESIUM SULFATE 2 GM/50ML IV SOLN
2.0000 g | Freq: Once | INTRAVENOUS | Status: AC
  Administered 2014-10-20: 2 g via INTRAVENOUS
  Filled 2014-10-20: qty 50

## 2014-10-20 MED ORDER — PHENYLEPHRINE HCL 10 MG/ML IJ SOLN
0.0000 ug/min | INTRAMUSCULAR | Status: DC
  Administered 2014-10-20: 50 ug/min via INTRAVENOUS
  Filled 2014-10-20: qty 4

## 2014-10-20 MED ORDER — MORPHINE SULFATE (PF) 2 MG/ML IV SOLN
2.0000 mg | Freq: Once | INTRAVENOUS | Status: AC
  Administered 2014-10-20: 2 mg via INTRAVENOUS
  Filled 2014-10-20: qty 1

## 2014-10-20 MED ORDER — SODIUM CHLORIDE 0.9 % IV BOLUS (SEPSIS)
1000.0000 mL | Freq: Once | INTRAVENOUS | Status: AC
  Administered 2014-10-20: 1000 mL via INTRAVENOUS

## 2014-10-20 NOTE — Progress Notes (Signed)
eLink Physician-Brief Progress Note Patient Name: [name]. [name] DOB: Jul 16, 1990 MRN: [id number]   Date of Service  10/20/2014  HPI/Events of Note  K+ = 2.6, Mg++ = 1.3 and Creatinine = 0.30.  eICU Interventions  Will order: 1. Replete Mg++ and K+. 2. Re-check BMP and Mg++ at 12 midnight.     Intervention Category Intermediate Interventions: Electrolyte abnormality - evaluation and management  [name] 10/20/2014, 4:53 PM

## 2014-10-20 NOTE — Progress Notes (Signed)
eLink Physician-Brief Progress Note Patient Name: [name]. [name] DOB: 1990/04/23 MRN: [id number]   Date of Service  10/20/2014  HPI/Events of Note  Continued issues with resp distress on NRB.  Is DNR.  Morphine initiated today for resp distress.  Nurse requesting additional dosing.  eICU Interventions  One time dose 2 mg morphine IV for ongoing resp distress.     Intervention Category Major Interventions: Respiratory failure - evaluation and management  DETERDING,ELIZABETH 10/20/2014, 11:16 PM

## 2014-10-20 NOTE — Progress Notes (Signed)
Patient O2sat in 70's. RT at bedside for suctioning and breathing treatment. Dr Marchelle Gearing notifed and at bedside with Mother and Father. Pt's o2 sat increased to 85. Morphine given for respitaory distress. BMET and CBC collected and results given to MD. Blood work recollected.

## 2014-10-20 NOTE — Progress Notes (Signed)
Subjective: No observed clinical seizures.   Exam: Filed Vitals:   10/20/14 0800  BP: 100/64  Pulse: 126  Temp:   Resp: 17   Gen: In bed, NAD Resp: non-labored breathing, no acute distress Abd: soft, nt  Neuro: MS: opens eyes partially to noxious stimuli, does not follow commands(this is baseline) AY:TKZSW, eyes slightly dysconjugate.  Motor: withdraws to nox stim x 4.  Sensory:as above.   Pertinent Labs: Vpa level 61  Impression: 24 yo M with CP, MR who presents with pneumonia and has had more severe than typical seizures. Any infection can lower seizure threshold. He is back on his hoem regimen and unless continued breakthrough seizures worse than typical(has daily partial seizures at baseline), I would not favor being more aggressive with his medications.   Recommendations: 1) Continue home depakote and gabapentin.  2) will continue to follow.   Ritta Slot, MD [facility] [phone]  If 7pm- 7am, please page neurology on call as listed in AMION.

## 2014-10-20 NOTE — Progress Notes (Signed)
Patient ID: [name], male   DOB: 1991/01/18, 24 y.o.   MRN: [id number] Dr. Dellie Catholic aware of K of 2.6 and Mg of 1.3

## 2014-10-20 NOTE — Consult Note (Signed)
PULMONARY / CRITICAL CARE MEDICINE   Name: [name]. Lehner MRN: [id number] DOB: Nov 05, 1990    ADMISSION DATE:  10/26/2014 CONSULTATION DATE:  8/25  REFERRING MD :  [name]   CHIEF COMPLAINT:   Acute hypoxic respiratory failure   INITIAL PRESENTATION:   [name] is a 24 y.o. male presented 8/24 with worsening cough. Mother stated it is a dry, mostly non-productive cough. Over the weekend he was noted to be moaning when he was placed on his left side and it was difficult to get him comfortable. He did not respond to SABA, home O2 as low as 80s but started to increase to the 90s at that time. Per the mother his symptoms rapidly continued to worsen. No hemoptysis or emesis. No runny nose. He had a low grade fever between 99-100F. He's on 2L [place] at night. At baseline he's nonverbal, no use of his hands, is wheelchair bound. He is cortically blind. He's on continuous feeds with Jevity at 96ml/hr however his mother stopped this when she got home today. No excess secretions noted, he's still on Rubinol. He does not take anything orally. Mom does not that he occasionally gets build up of "gunk" on the roof of his mouth that needs cleaning.  He gets chest PT with a vest TID at home. He has been hospitalized in the past for similar symptoms/PNA. The patient has never been intubated. 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 Last grand mal seizure has been in the last week. The mother, father, son, and babysitter all take care of the patient. He was admitted w/ working dx of PNA. He became progressively more hypoxic w/ escalating O2 requirements. His sats were 87% on 100% NRB. He became progressively hypoxic w/ complete left sided hemithorax opacification. Had witnessed seizure and was then moved to the ICU for progressive respiratory distress, tachycardia and shock. PCCM was asked to assume care.    SUBJECTIVE:  10/20/2014 - RN reporting face mask o2, desats on that . Needing  morphine Q2h  Or so for resp distress. On vasopressor neo Acutely ill . Made 400cc u rine since 7am.MOm at bedside.   VITAL SIGNS: Temp:  [97.6 F (36.4 C)-98.9 F (37.2 C)] 98.9 F (37.2 C) (08/26 0900) Pulse Rate:  [94-150] 126 (08/26 0800) Resp:  [13-23] 17 (08/26 0800) BP: (83-149)/(35-115) 100/64 mmHg (08/26 0800) SpO2:  [82 %-99 %] 91 % (08/26 0800) FiO2 (%):  [100 %] 100 % (08/26 0800) HEMODYNAMICS: CVP:  [5 mmHg] 5 mmHg VENTILATOR SETTINGS: Vent Mode:  [-]  FiO2 (%):  [100 %] 100 % INTAKE / OUTPUT:  Intake/Output Summary (Last 24 hours) at 10/20/14 1128 Last data filed at 10/20/14 0800  Gross per 24 hour  Intake 4336.99 ml  Output   4105 ml  Net 231.99 ml    PHYSICAL EXAMINATION: General:  Acutely ill  Neuro:  Minimally responsive. Contracted, non-verbal  HEENT:  Mm dry. No JVD.  Cardiovascular:  Tachy rrr  Lungs: tachy rrr . FAce mask o2, Easy desats Abdomen:  Soft, PEG unremarkable no OM  Musculoskeletal:  Contracted  Skin:  Intact   LABS: PULMONARY No results for input(s): PHART, PCO2ART, PO2ART, HCO3, TCO2, O2SAT in the last 168 hours.  Invalid input(s): PCO2, PO2  CBC  Recent Labs Lab 10/26/14 1810 10/19/14 0340  HGB 15.8 15.8  HCT 45.4 45.8  WBC 8.7 10.1  PLT 120* 146*    COAGULATION  Recent Labs Lab 10/19/14 0915  INR  1.20    CARDIAC  No results for input(s): TROPONINI in the last 168 hours. No results for input(s): PROBNP in the last 168 hours.   CHEMISTRY  Recent Labs Lab 12-Nov-2014 1810 10/19/14 0340 10/19/14 0843  NA 132*  --  133*  K 4.0  --  3.9  CL 95*  --  96*  CO2 29  --  29  GLUCOSE 87  --  103*  BUN <5*  --  <5*  CREATININE <0.30* <0.30* 0.32*  CALCIUM 9.3  --  9.5   Estimated Creatinine Clearance: 85.5 mL/min (by C-G formula based on Cr of 0.32).   LIVER  Recent Labs Lab 2014/11/12 1810 10/19/14 0915  AST 22  --   ALT 13*  --   ALKPHOS 73  --   BILITOT 1.0  --   PROT 6.8  --   ALBUMIN 3.3*   --   INR  --  1.20     INFECTIOUS  Recent Labs Lab 10/19/14 0949  LATICACIDVEN 1.4     ENDOCRINE CBG (last 3)   Recent Labs  10/19/14 0846 10/20/14 0214  GLUCAP 113* 89         IMAGING x48h  - image(s) personally visualized  -   highlighted in bold Dg Chest 2 View  12-Nov-2014   CLINICAL DATA:  Cough for 2 days. Fever today. History of pneumonia. History of cerebral palsy, quadriplegic, congenital CMV infection, hiatal hernia.  EXAM: CHEST  2 VIEW  COMPARISON:  04/22/2014  FINDINGS: There is consolidation and volume loss in the left lung base behind the heart consistent with left lower lobe collapse. This was present on the previous study suggesting a possible chronic versus recurrent process. Right lung is clear. No blunting of costophrenic angles. No pneumothorax. Normal heart size and pulmonary vascularity. Postoperative changes with posterior fixation of the thoracic spine. Enteric tube partially visualized in the left upper quadrant.  IMPRESSION: Consolidation and volume loss in the left lower lung. This was present on the previous study, suggesting a possible chronic versus recurrent process.   Electronically Signed   By: Burman Nieves M.D.   On: 11-12-2014 18:27   Dg Chest Port 1 View  10/19/2014   CLINICAL DATA:  Central line placement.  Pneumonia.  EXAM: PORTABLE CHEST - 1 VIEW  COMPARISON:  10/19/2014  FINDINGS: New left internal jugular central venous catheter is seen, with tip overlying the distal SVC. No pneumothorax visualized. Patient is partially rotated the right and spinal fixation rods are seen which somewhat limit evaluation.  Low lung volumes are again demonstrated. Decreased airspace disease is seen in the left lung, particularly the left upper lobe. Right lower lung airspace disease shows no significant change. Heart size is stable.  IMPRESSION: New left internal jugular central venous catheter tip overlies the distal SVC. No evidence of pneumothorax.  Mild  improvement in left lung airspace disease. No significant change in right lower lung airspace disease.   Electronically Signed   By: Myles Rosenthal M.D.   On: 10/19/2014 09:00   Dg Chest Port 1 View  10/19/2014   CLINICAL DATA:  Low oxygen saturation.  EXAM: PORTABLE CHEST - 1 VIEW  COMPARISON:  2014/11/12  FINDINGS: Initial airspace opacity in the left lobe obscuring the left heart border and left hemidiaphragm. No obvious bronchial cut off sign. This could be a diffuse infiltrate and given the rapidity possibly aspiration. Mucous plugging is also possible. There may be associated left effusion.  IMPRESSION: New  airspace process in the left hemithorax could be due to aspiration or mucous plugging.   Electronically Signed   By: Rudie [name] M.D.   On: 10/19/2014 08:31        ASSESSMENT / PLAN:  PULMONARY OETT: DNI A: Acute hypoxic respiratory failure CAP vs aspiration  Left hemithorax atelectasis and complete opacification in setting of mucous plugging    - cotninued easy desats and resp distress on 100% face mask - bad prognostic sign  P:   Titrate FIO2, consider high flow O2 Scheduled nebs Place nasal trumpet Suction PRN Not candidate for BIPAP DNI  CARDIOVASCULAR CVL A:  SIRS/sepsis  SVT   -on low dose of neo P:  Maximize volume status.  -giove normal saline bolus  Ck CVP  Cont IVFs at 75cc/h RENAL A:  At risk for AKI  P:   Avoid hypotension  Renal dose meds   GASTROINTESTINAL A: Protein cal malnutrition  PEG status  P:   Aspiration precautions  PEG feeds - hold 10/20/2014 and depending on status resume v countinue to hold on 10/21/14  HEMATOLOGIC A:   Mild thrombocytopenia   P:  Trend cbc Cont LMWH  INFECTIOUS BCx2 8/24>>> u strep 8/24>>> u legionella 8/24>>> Rocephin 8/24>>8/25  A:   Aspiration vs CAP w/ progressive radiographic worsening  P:     clinda 8/24>>> 1 dose vanc 8/24>>> ceftaz 8/25>>> azithro 8/24>>> ? Stop date Levaquin  ?  Date >> 10/20/2014   ENDOCRINE A:   No acute    P:   Trend am fasting glucose   NEUROLOGIC A:  Acute encephalopathy  Seizures Post-ictal state Cerebral palsy   - baseline non verbal  P:   RASS goal: -1 Neurology consulted to a/w AEDs Avoid sedating meds Seizure precautions   FAMILY  - Updates: goals d/w mother. He is now full DNR.   - Inter-disciplinary family meet or Palliative Care meeting due by 9/1    TODAY'S SUMMARY:  Long IDT discussion with mom with pharmacy, RN at bedside. Patient is full medical care + concomittant palliatiionw with prn morphine but DNR, DNI, No BiPAP. IF declines from resp stand point, bp, renal -t hen transition to comfort - mom wil need to be informed immediately. Ok fo basic labs     The patient is critically ill with multiple organ systems failure and requires high complexity decision making for assessment and support, frequent evaluation and titration of therapies, application of advanced monitoring technologies and extensive interpretation of multiple databases.   Critical Care Time devoted to patient care services described in this note is  35  Minutes. This time reflects time of care of this signee Dr Kalman Shan. This critical care time does not reflect procedure time, or teaching time or supervisory time of PA/NP/Med student/Med Resident etc but could involve care discussion time    Dr. Kalman Shan, M.D., [facility].C.P Pulmonary and Critical Care Medicine Staff Physician [facility] Pager: [phone], If no answer or between  15:00h - 7:00h: call [phone]  10/20/2014 12:00 PM

## 2014-10-20 NOTE — Progress Notes (Signed)
CRITICAL VALUE ALERT  Critical value received: K 2.6/Mg 1.3 Hgb 12.1 HCT 32 platelet 64  Date of notification:  t  Time of notification:  1620  Critical value read back yes  Nurse who received alert:  Jess   MD notified (1  Claria Dice RN for Dr Hosie Poisson  Time of first page:  1620  MD notified (2nd page):Elink  Time of second page:1645  Responding MD:    Time MD responded:  Information given

## 2014-10-21 DIAGNOSIS — Z515 Encounter for palliative care: Secondary | ICD-10-CM

## 2014-10-21 DIAGNOSIS — Z66 Do not resuscitate: Secondary | ICD-10-CM

## 2014-10-21 LAB — MAGNESIUM: Magnesium: 1.9 mg/dL (ref 1.7–2.4)

## 2014-10-21 LAB — BASIC METABOLIC PANEL
Anion gap: 3 — ABNORMAL LOW (ref 5–15)
CHLORIDE: 101 mmol/L (ref 101–111)
CO2: 32 mmol/L (ref 22–32)
Calcium: 8.4 mg/dL — ABNORMAL LOW (ref 8.9–10.3)
Creatinine, Ser: <0.3 mg/dL — ABNORMAL LOW (ref 0.61–1.24)
GLUCOSE: 126 mg/dL — AB (ref 65–99)
POTASSIUM: 4.6 mmol/L (ref 3.5–5.1)
Sodium: 136 mmol/L (ref 135–145)

## 2014-10-21 MED ORDER — MORPHINE BOLUS VIA INFUSION
5.0000 mg | INTRAVENOUS | Status: DC | PRN
  Filled 2014-10-21: qty 20

## 2014-10-21 MED ORDER — LORAZEPAM 2 MG/ML IJ SOLN: 1.0000 mg | INTRAMUSCULAR | Status: DC | PRN

## 2014-10-21 MED ORDER — MORPHINE SULFATE 25 MG/ML IV SOLN
10.0000 mg/h | INTRAVENOUS | Status: DC
  Administered 2014-10-21: 15.6 mg/h via INTRAVENOUS
  Administered 2014-10-21: 12.5 mg/h via INTRAVENOUS
  Administered 2014-10-21: 18 mg/h via INTRAVENOUS
  Administered 2014-10-21 (×2): 10 mg/h via INTRAVENOUS
  Administered 2014-10-22: 20 mg/h via INTRAVENOUS
  Administered 2014-10-22: 12 mg/h via INTRAVENOUS
  Filled 2014-10-21 (×4): qty 10

## 2014-10-21 MED ORDER — SODIUM CHLORIDE 0.9 % IV BOLUS (SEPSIS)
500.0000 mL | Freq: Once | INTRAVENOUS | Status: AC
  Administered 2014-10-21: 500 mL via INTRAVENOUS

## 2014-10-21 NOTE — Progress Notes (Signed)
Subjective: Noted change to comfort care. Single seizure overnight, staring type.   Exam: Filed Vitals:   10/21/14 0900  BP:   Pulse: 119  Temp:   Resp: 14   Gen: In bed, NRB in place Eyes midline, no nystagmus   Impression: 24 yo M with severe MR, CP, daily seizures at baseline. He has been made comfort care due to his respiratory status. He does have a GJ tube which will continue to allow [place] to give anti epileptics and I would not hold these.   Recommendations: 1) Continue anti-epileptics 2) Ativan PRN worse than typical seizure or seizure > 5 minutes.  3) Please call if there are any further neurological questions or concerns.   Ritta Slot, MD [facility] [phone]  If 7pm- 7am, please page neurology on call as listed in AMION.

## 2014-10-21 NOTE — Progress Notes (Signed)
eLink Physician-Brief Progress Note Patient Name: [name]. Nunziata DOB: 08/28/1990 MRN: [id number]   Date of Service  10/21/2014  HPI/Events of Note  Progressive resp decline through the night.  Now with sats of 76% on NRB.  Discussed EOL with mother, [name].  She wishes to proceed with full comfort care including morphine gtt.  eICU Interventions  Patient made full DNR Morphine gtt to be initiated      Intervention Category Major Interventions: End of life / care limitation discussion  DETERDING,ELIZABETH 10/21/2014, 2:33 AM

## 2014-10-21 NOTE — Progress Notes (Signed)
Titrating up patient's morphine drip, still showing signs of air hunger and respiratory distress (gasping, accessory muscle use).  Mother at bedside.  Mother still does not want to remove oxygen or turn off the monitors.

## 2014-10-21 NOTE — Progress Notes (Signed)
Patient's HR has been elevated for 30 minutes and has become irregular (new to patient).  His O2 sats is 78.  Patient appears more distressed with more laborious breathing.  Patient has been given his breathing treatments, and repositioned with no improvements.  Respiratory is at the bedside.  Spoke with E-link MD that the best course is to call the mother in to the patient's bedside.  Mother was called and is currently on her way.

## 2014-10-21 NOTE — Progress Notes (Addendum)
Titrating patient's morphine drip up because patient is use accessory muscles really gulping for air, showing absolute signs of air hunger and respiratory distress.  At baseline patient in non-verbal and unresponsive to most stimuli.  Mother is at bedside and does not want patient's oxygen removed at this time nor the monitors turned off.

## 2014-10-21 NOTE — Progress Notes (Addendum)
Patient's mother reported that the patient had a seizure lasting less than a minute.  She reported twitching of the eye lids.  She has requested that we continue to give the patient's seizure medications or he will continue to seize.  Spoke with Dr. Darrick Penna and she said to reinstate the seizure medications even though the patient is officially on comfort care.  Mother is at bedside.  7:00 dose of valproic sprinkles (375) administered.

## 2014-10-21 NOTE — Progress Notes (Signed)
Call from RN that mom getting upset and anxious about fluctuations in HR, BP and now falling ur op. Advised to give 500cc saline bolus to temporize. Later met with mom. Mom says she is still unclear about comfort v medical care and what are the do and dont. Further questioning - she is afraid that she is doing something to hasten kids death. AKA - she is worried she is witholding care that can lead to son's demise.   1. Validated her feelings 2. Thoughtful listening 3. Gave prognosis - explained that since my last eval and now re-eval patient has declined. Prognosis is hours to days. Explained that patient likely in terminal decline and pressors, labs, fluid boluses unlikely to help  4. She is ok with basic maintenance fludisabx, seizure meds and morphine gtt + ativan prn (gtt if needed) for comfort and basic medical care + face mask 5. She declined spiritual care (visited already by her priest) and palliative care.  6. She is in agreement and in synchrony with current plan 7. Still no labs, no pressors, no bolus, no cpr, no intubation   15 min critical care   Dr. Brand Males, M.D., [facility].C.P Pulmonary and Critical Care Medicine Staff Physician [place] [facility] Pager: [phone], If no answer or between  15:00h - 7:00h: call [phone]  10/21/2014 1:52 PM

## 2014-10-21 NOTE — Consult Note (Signed)
PULMONARY / CRITICAL CARE MEDICINE   Name: [name]. Mccravy MRN: [id number] DOB: January 17, 1991    ADMISSION DATE:  10/12/2014 CONSULTATION DATE:  8/25  REFERRING MD :  Pollie Meyer   CHIEF COMPLAINT:   Acute hypoxic respiratory failure   INITIAL PRESENTATION:   [name] is a 24 y.o. male presented 8/24 with worsening cough. Mother stated it is a dry, mostly non-productive cough. Over the weekend he was noted to be moaning when he was placed on his left side and it was difficult to get him comfortable. He did not respond to SABA, home O2 as low as 80s but started to increase to the 90s at that time. Per the mother his symptoms rapidly continued to worsen. No hemoptysis or emesis. No runny nose. He had a low grade fever between 99-100F. He's on 2L [place] at night. At baseline he's nonverbal, no use of his hands, is wheelchair bound. He is cortically blind. He's on continuous feeds with Jevity at 44ml/hr however his mother stopped this when she got home today. No excess secretions noted, he's still on Rubinol. He does not take anything orally. Mom does not that he occasionally gets build up of "gunk" on the roof of his mouth that needs cleaning.  He gets chest PT with a vest TID at home. He has been hospitalized in the past for similar symptoms/PNA. The patient has never been intubated. 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 Last grand mal seizure has been in the last week. The mother, father, son, and babysitter all take care of the patient. He was admitted w/ working dx of PNA. He became progressively more hypoxic w/ escalating O2 requirements. His sats were 87% on 100% NRB. He became progressively hypoxic w/ complete left sided hemithorax opacification. Had witnessed seizure and was then moved to the ICU for progressive respiratory distress, tachycardia and shock. PCCM was asked to assume care.   10/20/14 - RN reporting face mask o2, desats on that . Needing morphine Q2h  Or so for  resp distress. On vasopressor neo . Acutely ill . Made 400cc u rine since 7am.MOm at bedside.   SUBJECTIVE/OVERNIGHT/INTERVAL HX 10/21/14 - Deterioration overnight with profound air hugner. Needed morphine gtt and titrated upto 54mmg/h. Siezuers as well . Mom, dad and brother at bedside - express desire for full comfort but do  Want antibiotics and seizure meds as part of comfort + very simple basic medical strateggy. Offered ativan gtt but want it only prn for now. Off pressors.    VITAL SIGNS: Temp:  [97.4 F (36.3 C)-97.7 F (36.5 C)] 97.6 F (36.4 C) (08/27 0829) Pulse Rate:  [35-143] 122 (08/27 1050) Resp:  [11-29] 15 (08/27 1050) BP: (65-119)/(32-73) 65/32 mmHg (08/27 1032) SpO2:  [74 %-98 %] 90 % (08/27 1050) FiO2 (%):  [100 %] 100 % (08/27 0903) HEMODYNAMICS:   VENTILATOR SETTINGS: Vent Mode:  [-]  FiO2 (%):  [100 %] 100 % INTAKE / OUTPUT:  Intake/Output Summary (Last 24 hours) at 10/21/14 1056 Last data filed at 10/21/14 0900  Gross per 24 hour  Intake 2982.27 ml  Output   1700 ml  Net 1282.27 ml    PHYSICAL EXAMINATION: General:  Acutely ill  Neuro:  Minimally responsive. Contracted, non-verbal  HEENT:  Mm dry. No JVD.  Cardiovascular:  Tachy rrr  Lungs: tachy rrr . FAce mask o2, Easy desats Abdomen:  Soft, PEG unremarkable no OM  Musculoskeletal:  Contracted  Skin:  Intact   LABS:  PULMONARY No results for input(s): PHART, PCO2ART, PO2ART, HCO3, TCO2, O2SAT in the last 168 hours.  Invalid input(s): PCO2, PO2  CBC  Recent Labs Lab 10/19/14 0340 10/20/14 1350 10/20/14 1442  HGB 15.8 11.1* 12.4*  HCT 45.8 32.1* 36.4*  WBC 10.1 9.5 11.7*  PLT 146* 64* 65*    COAGULATION  Recent Labs Lab 10/19/14 0915  INR 1.20    CARDIAC  No results for input(s): TROPONINI in the last 168 hours. No results for input(s): PROBNP in the last 168 hours.   CHEMISTRY  Recent Labs Lab Oct 27, 2014 1810 10/19/14 0340 10/19/14 0843 10/20/14 1350  10/20/14 1535 10/20/14 2345  NA 132*  --  133* 136 138 136  K 4.0  --  3.9 2.5* 2.6* 4.6  CL 95*  --  96* 101 103 101  CO2 29  --  32  GLUCOSE 87  --  103* 92 116* 126*  BUN <5*  --  <5* <5* <5* <5*  CREATININE <0.30* <0.30* 0.32* <0.30* <0.30* <0.30*  CALCIUM 9.3  --  9.5 8.0* 8.4* 8.4*  MG  --   --   --  1.3*  --  1.9  PHOS  --   --   --  2.3*  --   --    CrCl cannot be calculated (Patient has no serum creatinine result on file.).   LIVER  Recent Labs Lab 10-27-14 1810 10/19/14 0915  AST 22  --   ALT 13*  --   ALKPHOS 73  --   BILITOT 1.0  --   PROT 6.8  --   ALBUMIN 3.3*  --   INR  --  1.20     INFECTIOUS  Recent Labs Lab 10/20/14 1356 10/20/14 1534 10/20/14 2156  LATICACIDVEN 1.0 1.3 0.8     ENDOCRINE CBG (last 3)   Recent Labs  10/19/14 0846 10/20/14 0214  GLUCAP 113* 89         IMAGING x48h  - image(s) personally visualized  -   highlighted in bold Dg Chest Port 1 View  10/20/2014   CLINICAL DATA:  Acute respiratory failure  EXAM: PORTABLE CHEST - 1 VIEW  COMPARISON:  10/19/2014  FINDINGS: There is a left IJ catheter. The tip of the catheter is obscured by spinal hardware. There is now complete opacification of the left hemi thorax. Small to moderate right pleural effusion is unchanged. The cardiac silhouette is obscured bite left lung opacification. Scoliosis rods are identified within the thoracic and lumbar spine.  IMPRESSION: 1. Complete opacification of the left hemi thorax.   Electronically Signed   By: Signa Kell M.D.   On: 10/20/2014 12:32        ASSESSMENT / PLAN:  PULMONARY OETT: DNI A: Acute hypoxic respiratory failure CAP vs aspiration  Left hemithorax atelectasis and complete opacification in setting of mucous plugging    - cotninued easy desats and resp distress on 100% face mask - bad prognostic sign. Needing morphine gtt for air hunger  P:   Titrate FIO2, consider high flow O2 Scheduled nebs Suction  PRN for comfort only Not candidate for BIPAP DNI Morphine gtt for comfort Ativan prn for comfort -> move to gtt if needed  CARDIOVASCULAR CVL A:  SIRS/sepsis  SVT   -off neo P:  Cont IVFs at 50cc/h NO MORE PRESSORS per goals  RENAL A:  At risk for AKI  P:   Comfort care  GASTROINTESTINAL A: Protein cal malnutrition  PEG  status  P:   Head elevated No feeds - goal is comfort   HEMATOLOGIC A:   Mild thrombocytopenia   P: dc cbc and  LMWH  INFECTIOUS BCx2 8/24>>> u strep 8/24>>> u legionella 8/24>>> Rocephin 8/24>>8/25  A:   Aspiration vs CAP w/ progressive radiographic worsening  P:   Continue abx per family  ENDOCRINE A:   No acute    P:   No blood sugar check   NEUROLOGIC A:  Acute encephalopathy  Seizures Post-ictal state Cerebral palsy   - baseline non verbal.. Did have seizures  P:   Morphine gtt Ativan prn Seizure meds Neuro signed off  FAMILY  - Updates: goals d/w mother. He is now full DNR.   - Inter-disciplinary family meet or Palliative Care meeting due by 9/1 -0 done on 10/20/14 and 10/21/2014   With RN Phyliis and Jess at bedside with Mom, dad and brother at bedside - express desire for full comfort but do  Want antibiotics and seizure meds as part of comfort + very simple basic medical strateggy. ANticipate death next few to several days. Small sliver of chance he survives   The patient is critically ill with multiple organ systems failure and requires high complexity decision making for assessment and support, frequent evaluation and titration of therapies, application of advanced monitoring technologies and extensive interpretation of multiple databases.   Critical Care Time devoted to patient care services described in this note is  35  Minutes. This time reflects time of care of this signee Dr Kalman Shan. This critical care time does not reflect procedure time, or teaching time or supervisory time of PA/NP/Med  student/Med Resident etc but could involve care discussion time    Dr. Kalman Shan, M.D., [facility].C.P Pulmonary and Critical Care Medicine Staff Physician [facility] [place] Pulmonary and Critical Care Pager: [phone], If no answer or between  15:00h - 7:00h: call [phone]  10/21/2014 10:56 AM

## 2014-10-21 NOTE — Progress Notes (Signed)
Mother at bedside, and spoke with Dr. Bea Laura. Deterding about initiating comfort care due to her son's rapid decline and signs of discomfort (air hunger).  She verbalized not wanting her son to suffer, and gave verbal permission to Dr. Darrick Penna to start comfort care measures.

## 2014-10-21 NOTE — Progress Notes (Signed)
Patient will not receive CPT at this time due to increase in heart rate. Patient has been made full comfort care at this time.

## 2014-10-22 NOTE — Consult Note (Signed)
PULMONARY / CRITICAL CARE MEDICINE   Name: [name] MRN: [id number] DOB: May 29, 1990    ADMISSION DATE:  10/10/2014 CONSULTATION DATE:  8/25  REFERRING MD :  Pollie Meyer   CHIEF COMPLAINT:   Acute hypoxic respiratory failure   INITIAL PRESENTATION:   [name] is a 24 y.o. male presented 8/24 with worsening cough. Mother stated it is a dry, mostly non-productive cough. Over the weekend he was noted to be moaning when he was placed on his left side and it was difficult to get him comfortable. He did not respond to SABA, home O2 as low as 80s but started to increase to the 90s at that time. Per the mother his symptoms rapidly continued to worsen. No hemoptysis or emesis. No runny nose. He had a low grade fever between 99-100F. He's on 2L [place] at night. At baseline he's nonverbal, no use of his hands, is wheelchair bound. He is cortically blind. He's on continuous feeds with Jevity at 70ml/hr however his mother stopped this when she got home today. No excess secretions noted, he's still on Rubinol. He does not take anything orally. Mom does not that he occasionally gets build up of "gunk" on the roof of his mouth that needs cleaning.  He gets chest PT with a vest TID at home. He has been hospitalized in the past for similar symptoms/PNA. The patient has never been intubated. 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 Last grand mal seizure has been in the last week. The mother, father, son, and babysitter all take care of the patient. He was admitted w/ working dx of PNA. He became progressively more hypoxic w/ escalating O2 requirements. His sats were 87% on 100% NRB. He became progressively hypoxic w/ complete left sided hemithorax opacification. Had witnessed seizure and was then moved to the ICU for progressive respiratory distress, tachycardia and shock. PCCM was asked to assume care.   10/21/2014 - RN reporting face mask o2, desats on that . Needing morphine Q2h  Or so  for resp distress. On vasopressor neo Acutely ill . Made 400cc u rine since 7am.MOm at bedside.   SUBJECTIVE/OVERNIGHT/INTERVAL HX 10/22/14 - On  morphine gtt. Guppy breathing. Maintaining HR/BP and ur op per RN. Mom sleeping. RN and prior reports mom struggling with guilt of "giving up" or not doing the right measures and allowing patient to die. Not willing to meet with pallaitive care. RN concerned mom might not accept transfer to palliative unit   VITAL SIGNS: Temp:  [94.3 F (34.6 C)-97.2 F (36.2 C)] 97.2 F (36.2 C) (08/28 0841) Pulse Rate:  [108-123] 113 (08/28 0700) Resp:  [9-20] 13 (08/28 0700) BP: (89-100)/(48-58) 89/48 mmHg (08/28 0650) SpO2:  [84 %-90 %] 88 % (08/28 0700) FiO2 (%):  [100 %] 100 % (08/28 0400) HEMODYNAMICS:   VENTILATOR SETTINGS: Vent Mode:  [-]  FiO2 (%):  [100 %] 100 % INTAKE / OUTPUT:  Intake/Output Summary (Last 24 hours) at 10/22/14 1134 Last data filed at 10/22/14 0900  Gross per 24 hour  Intake 2069.23 ml  Output   1950 ml  Net 119.23 ml    PHYSICAL EXAMINATION: General:  Acutely ill  Neuro:  Minimally responsive. Contracted, non-verbal  HEENT:  Mm dry. No JVD.  Cardiovascular:  Tachy rrr  Lungs: tachy rrr . FAce mask o2, Easy desats, Guyppy breathing Pulse ox 86% Abdomen:  Soft, PEG unremarkable no OM  Musculoskeletal:  Contracted  Skin:  Intact   LABS: PULMONARY  No results for input(s): PHART, PCO2ART, PO2ART, HCO3, TCO2, O2SAT in the last 168 hours.  Invalid input(s): PCO2, PO2  CBC  Recent Labs Lab 10/19/14 0340 10/20/14 1350 10/20/14 1442  HGB 15.8 11.1* 12.4*  HCT 45.8 32.1* 36.4*  WBC 10.1 9.5 11.7*  PLT 146* 64* 65*    COAGULATION  Recent Labs Lab 10/19/14 0915  INR 1.20    CARDIAC  No results for input(s): TROPONINI in the last 168 hours. No results for input(s): PROBNP in the last 168 hours.   CHEMISTRY  Recent Labs Lab 2014/11/05 1810 10/19/14 0340 10/19/14 0843 10/20/14 1350  10/20/14 1535 10/20/14 2345  NA 132*  --  133* 136 138 136  K 4.0  --  3.9 2.5* 2.6* 4.6  CL 95*  --  96* 101 103 101  CO2 29  --  29 31 30  32  GLUCOSE 87  --  103* 92 116* 126*  BUN <5*  --  <5* <5* <5* <5*  CREATININE <0.30* <0.30* 0.32* <0.30* <0.30* <0.30*  CALCIUM 9.3  --  9.5 8.0* 8.4* 8.4*  MG  --   --   --  1.3*  --  1.9  PHOS  --   --   --  2.3*  --   --    CrCl cannot be calculated (Patient has no serum creatinine result on file.).   LIVER  Recent Labs Lab 2014-11-05 1810 10/19/14 0915  AST 22  --   ALT 13*  --   ALKPHOS 73  --   BILITOT 1.0  --   PROT 6.8  --   ALBUMIN 3.3*  --   INR  --  1.20     INFECTIOUS  Recent Labs Lab 10/20/14 1356 10/20/14 1534 10/20/14 2156  LATICACIDVEN 1.0 1.3 0.8     ENDOCRINE CBG (last 3)   Recent Labs  10/20/14 0214  GLUCAP 89         IMAGING x48h  - image(s) personally visualized  -   highlighted in bold Dg Chest Port 1 View  10/20/2014   CLINICAL DATA:  Acute respiratory failure  EXAM: PORTABLE CHEST - 1 VIEW  COMPARISON:  10/19/2014  FINDINGS: There is a left IJ catheter. The tip of the catheter is obscured by spinal hardware. There is now complete opacification of the left hemi thorax. Small to moderate right pleural effusion is unchanged. The cardiac silhouette is obscured bite left lung opacification. Scoliosis rods are identified within the thoracic and lumbar spine.  IMPRESSION: 1. Complete opacification of the left hemi thorax.   Electronically Signed   By: Signa Kell M.D.   On: 10/20/2014 12:32        ASSESSMENT / PLAN:  PULMONARY OETT: DNI A: Acute hypoxic respiratory failure CAP vs aspiration  Left hemithorax atelectasis and complete opacification in setting of mucous plugging    - cotninued easy desats 86% is best and resp distress on 100% face mask - bad prognostic sign  P:   Titrate FIO2, consider high flow O2 Scheduled nebs Place nasal trumpet Suction PRN Not candidate for  BIPAP DNI  CARDIOVASCULAR CVL A:  SIRS/sepsis  SVT   -fluids  P:  Fluids No pressors per goals of care  RENAL A:  At risk for AKI  P:   Avoid hypotension  Renal dose meds   GASTROINTESTINAL A: Protein cal malnutrition  PEG status  P:   Aspiration precautions  PEG feeds - hold 10/22/2014 and depending on status resume  v countinue to hold on 10/06/2014  HEMATOLOGIC A:   Mild thrombocytopenia   P:  No labs No LMWH per goals of care  INFECTIOUS BCx2 8/24>>> u strep 8/24>>> u legionella 8/24>>> Rocephin 8/24>>8/25  A:   Aspiration vs CAP w/ progressive radiographic worsening  P:   Cont abx per goals of care  clinda 8/24>>> 1 dose vanc 8/24>>>  ceftaz 8/25>>> azithro 8/24>>> ? Stop date Levaquin  ? Date >> 10/20/2014   ENDOCRINE A:   No acute    P:   No labs per goals of care  NEUROLOGIC A:  Acute encephalopathy  Seizures Post-ictal state Cerebral palsy   - baseline non verbal. On morphine gtt for resp distress. On anti-epileptics  P:   Cotniue morphine gtt and seixure meds per goals of care RN asked to titrate morphine gtt down to lower dose if possible  FAMILY  - Updates: goals d/w mother. He is now full DNR with goal of comfort with basic medical care of abx and seizure meds but no labs or pressors. Prognosis few to several days. Suspect terminal decline  - Inter-disciplinary family meet or Palliative Care meeting due by 9/1 - goals d/w mother on daily basis with RN preset. As of 10/22/2014 . He is now full DNR with goal of comfort with basic medical care of abx and seizure meds but no labs , no pressors, no biPAP.  Prognosis few to several days. Suspect terminal decline. Mom still seems to have angst about this care pathway even after declaring this as stated goal and confirming it repeatedly. However, she is not keen o moving out of ICU. She is not keen on pallitive floor transfer        The patient is critically ill with multiple organ  systems failure and requires high complexity decision making for assessment and support, frequent evaluation and titration of therapies, application of advanced monitoring technologies and extensive interpretation of multiple databases.   Critical Care Time devoted to patient care services described in this note is  30  Minutes. This time reflects time of care of this signee Dr Kalman Shan. This critical care time does not reflect procedure time, or teaching time or supervisory time of PA/NP/Med student/Med Resident etc but could involve care discussion time    Dr. Kalman Shan, M.D., [facility].C.P Pulmonary and Critical Care Medicine Staff Physician [place] [place] Pulmonary and Critical Care Pager: [phone], If no answer or between  15:00h - 7:00h: call [phone]  10/22/2014 11:34 AM

## 2014-10-23 ENCOUNTER — Telehealth: Payer: Self-pay | Admitting: *Deleted

## 2014-10-24 ENCOUNTER — Telehealth: Payer: Self-pay

## 2014-10-24 LAB — CULTURE, BLOOD (ROUTINE X 2)
CULTURE: NO GROWTH
Culture: NO GROWTH

## 2014-10-24 NOTE — Telephone Encounter (Signed)
On 31-Oct-2014 I received a death certificate from [place] and [facility]. The death certificate is for burial. The patient is a patient of Doctor Ramaswamy. The death certificate will be taken to [facility] today for signature. On 10/31/2014 I received the death certificate back from [employer]. I got the death certificate ready for pickup. I called the funeral home to let them know it was ready for pickup.

## 2014-10-24 NOTE — Telephone Encounter (Signed)
I returned mother's call and expressed my gratitude for her care and my support as she makes this very difficult transition.

## 2014-10-26 NOTE — Care Management Note (Signed)
Case Management Note  Patient Details  Name: [name]. [name] MRN: [id number] Date of Birth: August 17, 1990  Subjective/Objective:                    Action/Plan:   Expected Discharge Date:  10/27/14               Expected Discharge Plan:  Home/Self Care  In-House Referral:     Discharge planning Services  CM Consult  Post Acute Care Choice:    Choice offered to:     DME Arranged:    DME Agency:     HH Arranged:    HH Agency:     Status of Service:  Completed, signed off  Medicare Important Message Given:    Date Medicare IM Given:    Medicare IM give by:    Date Additional Medicare IM Given:    Additional Medicare Important Message give by:     If discussed at Long Length of Stay Meetings, dates discussed:    Additional Comments:  Vangie Bicker, RN 10/14/2014, 8:12 AM

## 2014-10-26 NOTE — Telephone Encounter (Signed)
Mom called and left a voicemail to notify you that patient passed away this morning. She would like to thank you for your years of service and invited you to call her back if possible.  CB #: [phone]

## 2014-10-26 NOTE — Progress Notes (Signed)
Pt lying in bed with mother, [name], at bedside. At 04:34am auscultated no heart sounds, lungs, and pulseless by this nurse and Ladora Daniel RN. Asystole noted on monitor and verified by strip. Dr. Craige Cotta notified.

## 2014-10-26 NOTE — Progress Notes (Signed)
Wasted 200cc morphine in sink with Eddie North RN

## 2014-10-26 DEATH — deceased

## 2014-11-25 NOTE — Discharge Summary (Signed)
DISCHARGE SUMMARY    Date of admit: 10/11/2014  6:57 PM Date of discharge: 10/07/2014  6:08 AM Length of Stay: 5 days  PCP is Jacquiline Doe, MD   PROBLEM LIST Active Problems:   Pneumonia with lung collapse left side - aspiration v HCAP v CAP   Acute respiratory failure with hypoxia   Septic shock   Acute respiratory failure   DNAR (do not attempt resuscitation)   Palliative care status  Baseline:  Pressure ulcer baseline   Cerebral palsy     SUMMARY [name] was 24 y.o. patient with    has a past medical history of Allergy; Asthma; Cerebral palsy, quadriplegic; Congenital CMV infection; Neuromuscular disorder; H/O hiatal hernia; Pneumonia; Community acquired pneumonia (06/30/2011); GERD (gastroesophageal reflux disease); Seizures; Cytomegalovirus; On home oxygen therapy; Jejunostomy tube present; and Acute and chronic respiratory failure (12/24/2011).   has past surgical history that includes Nissen fundoplication; rod (~ 2003 "or before"); PEG tube placement; percutaneous gastrotomy tube replacement; and Circumcision.   Admitted on 10/07/2014 with base;ome congenital CMV infection with cerebral palsy, who is baseline non verbal, no use of hands, cortically blind and is wheelchair bound and with seizure disorder. [name] was admitted with left sided pneumonia . On 10/19/14 [name] developed progressive respiratory failure and worsening distress and transferred to ICU. Noted to have complete opacification of left lung. [name] then developed septic shock and profound respiratory distress whereby needing morphine drip for comfort. Discussions with family - they elected for full medical care but no intubation or CPR. Patient continued to deteriorate and goals of care had to be increasingly comfort focussed. [name] subsequently expired 10/17/2014       SIGNED Dr. Kalman Shan, M.D., [facility].C.P Pulmonary and Critical Care Medicine Staff Physician [place] [place] Pulmonary  and Critical Care Pager: [phone], If no answer or between  15:00h - 7:00h: call [phone]  10/27/2014 9:41 PM

## 2016-08-20 IMAGING — CR DG CHEST 1V PORT
1 series · 1 of 1 positions shown · non-contrast
Comparison: December 12, 2013.

CLINICAL DATA: Fever and cough.

EXAM:
PORTABLE CHEST - 1 VIEW

[AP]
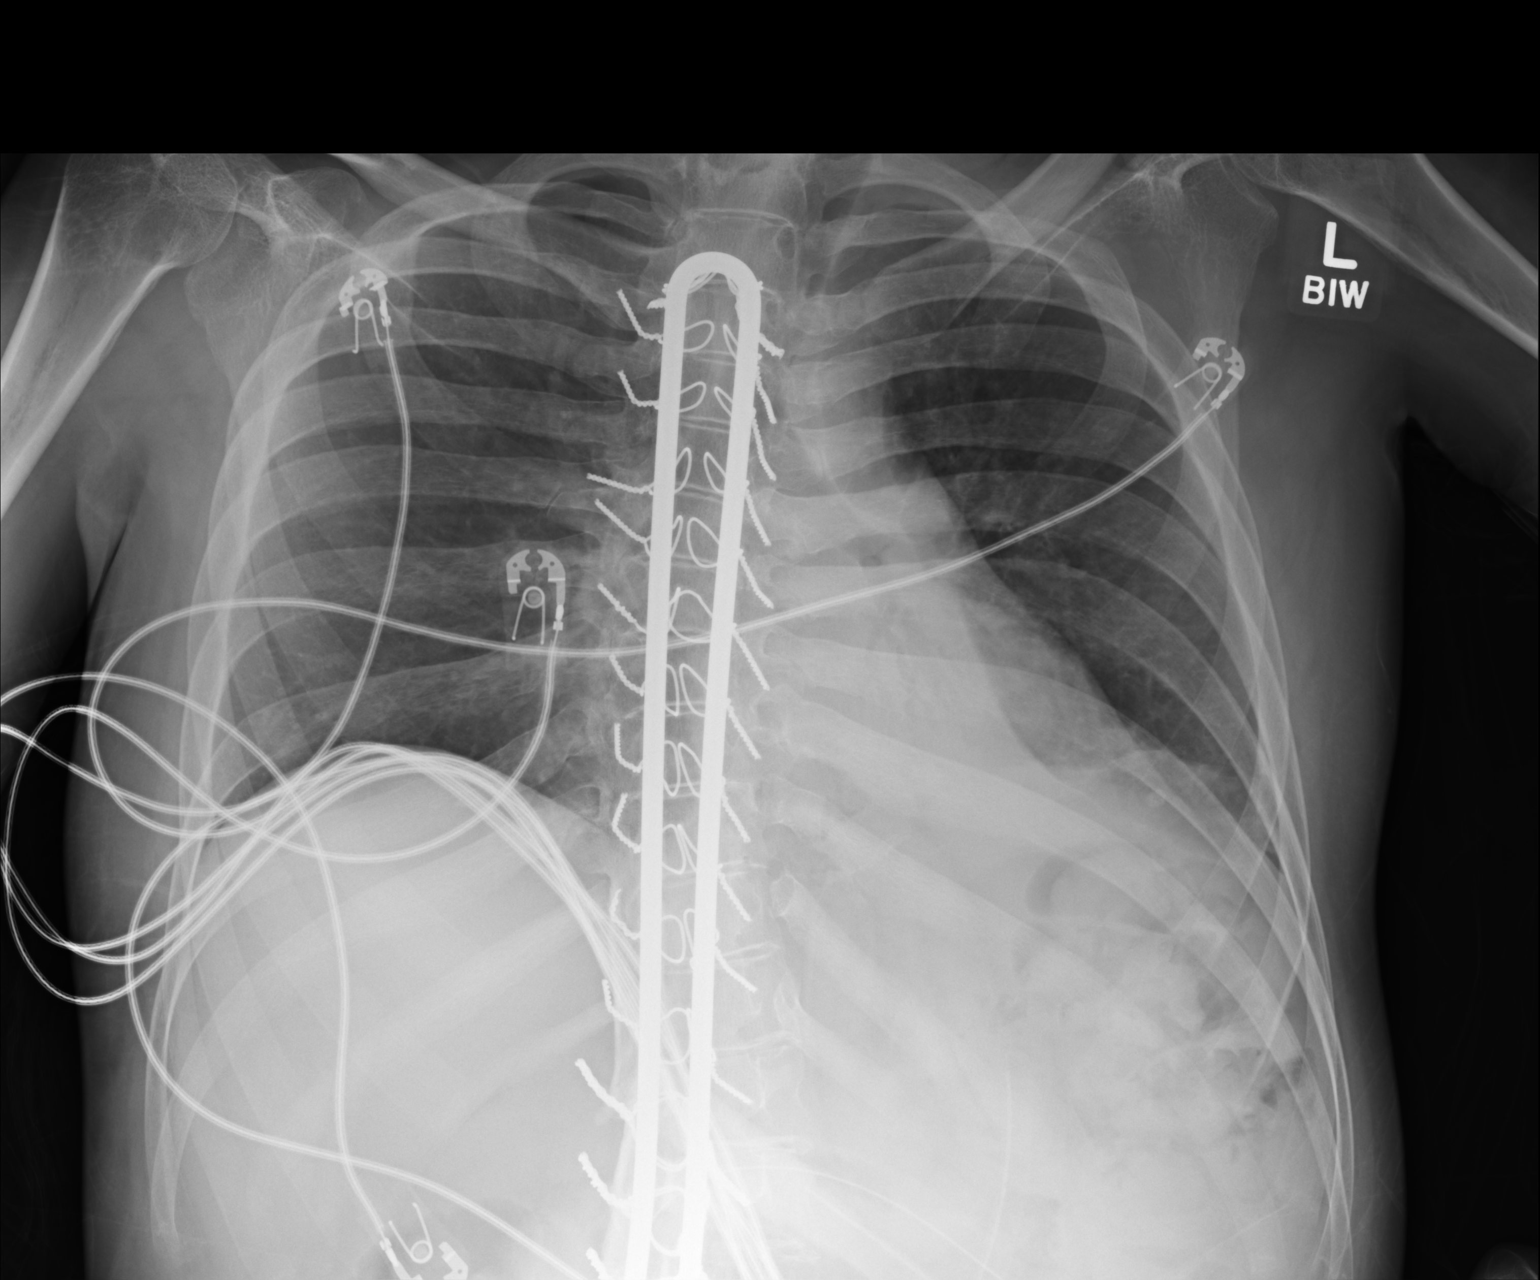

[1 of 1 positions shown; findings below may reference images not displayed]

FINDINGS: Stable cardiomediastinal silhouette. Left retrocardiac opacity is
noted concerning for atelectasis or pneumonia. No pneumothorax is
noted. Right lung is clear. Possible mild left pleural effusion.
Spinal rods are again noted.
IMPRESSION: Left retrocardiac opacity is noted concerning for atelectasis or
pneumonia. Possible mild left pleural effusion is noted as well.
